# Patient Record
Sex: Female | Born: 1960
Health system: Southern US, Community
[De-identification: ages and names within clinical notes are randomized; demographics above are authoritative.]

## PROBLEM LIST (undated history)

## (undated) DIAGNOSIS — M1990 Unspecified osteoarthritis, unspecified site: Secondary | ICD-10-CM

## (undated) DIAGNOSIS — C801 Malignant (primary) neoplasm, unspecified: Secondary | ICD-10-CM

## (undated) DIAGNOSIS — F419 Anxiety disorder, unspecified: Secondary | ICD-10-CM

## (undated) DIAGNOSIS — I2510 Atherosclerotic heart disease of native coronary artery without angina pectoris: Secondary | ICD-10-CM

## (undated) DIAGNOSIS — Z87442 Personal history of urinary calculi: Secondary | ICD-10-CM

## (undated) DIAGNOSIS — E785 Hyperlipidemia, unspecified: Secondary | ICD-10-CM

## (undated) DIAGNOSIS — T7840XA Allergy, unspecified, initial encounter: Secondary | ICD-10-CM

## (undated) DIAGNOSIS — F32A Depression, unspecified: Secondary | ICD-10-CM

## (undated) DIAGNOSIS — M810 Age-related osteoporosis without current pathological fracture: Secondary | ICD-10-CM

## (undated) DIAGNOSIS — J449 Chronic obstructive pulmonary disease, unspecified: Secondary | ICD-10-CM

## (undated) DIAGNOSIS — G709 Myoneural disorder, unspecified: Secondary | ICD-10-CM

## (undated) DIAGNOSIS — F329 Major depressive disorder, single episode, unspecified: Secondary | ICD-10-CM

## (undated) DIAGNOSIS — K219 Gastro-esophageal reflux disease without esophagitis: Secondary | ICD-10-CM

## (undated) HISTORY — DX: Depression, unspecified: F32.A

## (undated) HISTORY — DX: Chronic obstructive pulmonary disease, unspecified: J44.9

## (undated) HISTORY — DX: Unspecified osteoarthritis, unspecified site: M19.90

## (undated) HISTORY — DX: Allergy, unspecified, initial encounter: T78.40XA

## (undated) HISTORY — PX: BREAST SURGERY: SHX581

## (undated) HISTORY — DX: Anxiety disorder, unspecified: F41.9

## (undated) HISTORY — DX: Gastro-esophageal reflux disease without esophagitis: K21.9

## (undated) HISTORY — DX: Malignant (primary) neoplasm, unspecified: C80.1

## (undated) HISTORY — DX: Myoneural disorder, unspecified: G70.9

## (undated) HISTORY — DX: Hyperlipidemia, unspecified: E78.5

---
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
## 1993-12-25 HISTORY — PX: ENDOMETRIAL ABLATION: SHX621

## 1998-12-09 ENCOUNTER — Other Ambulatory Visit: Admission: RE | Admit: 1998-12-09 | Discharge: 1998-12-09 | Payer: Self-pay | Admitting: *Deleted

## 1999-01-27 ENCOUNTER — Other Ambulatory Visit: Admission: RE | Admit: 1999-01-27 | Discharge: 1999-01-27 | Payer: Self-pay | Admitting: Radiology

## 1999-03-01 ENCOUNTER — Other Ambulatory Visit: Admission: RE | Admit: 1999-03-01 | Discharge: 1999-03-01 | Payer: Self-pay | Admitting: Obstetrics and Gynecology

## 1999-03-28 ENCOUNTER — Ambulatory Visit (HOSPITAL_COMMUNITY): Admission: RE | Admit: 1999-03-28 | Discharge: 1999-03-28 | Payer: Self-pay | Admitting: Obstetrics and Gynecology

## 2000-04-20 ENCOUNTER — Encounter (INDEPENDENT_AMBULATORY_CARE_PROVIDER_SITE_OTHER): Payer: Self-pay | Admitting: Specialist

## 2000-04-20 ENCOUNTER — Ambulatory Visit (HOSPITAL_COMMUNITY): Admission: RE | Admit: 2000-04-20 | Discharge: 2000-04-20 | Payer: Self-pay | Admitting: Gastroenterology

## 2000-12-31 ENCOUNTER — Other Ambulatory Visit: Admission: RE | Admit: 2000-12-31 | Discharge: 2000-12-31 | Payer: Self-pay | Admitting: Obstetrics and Gynecology

## 2001-02-15 ENCOUNTER — Encounter (INDEPENDENT_AMBULATORY_CARE_PROVIDER_SITE_OTHER): Payer: Self-pay | Admitting: Specialist

## 2001-02-15 ENCOUNTER — Ambulatory Visit (HOSPITAL_COMMUNITY): Admission: RE | Admit: 2001-02-15 | Discharge: 2001-02-15 | Payer: Self-pay | Admitting: General Surgery

## 2002-02-14 ENCOUNTER — Encounter: Admission: RE | Admit: 2002-02-14 | Discharge: 2002-02-14 | Payer: Self-pay | Admitting: General Surgery

## 2002-02-14 ENCOUNTER — Encounter: Payer: Self-pay | Admitting: General Surgery

## 2002-03-18 ENCOUNTER — Other Ambulatory Visit: Admission: RE | Admit: 2002-03-18 | Discharge: 2002-03-18 | Payer: Self-pay | Admitting: Obstetrics and Gynecology

## 2003-02-17 ENCOUNTER — Encounter: Admission: RE | Admit: 2003-02-17 | Discharge: 2003-02-17 | Payer: Self-pay | Admitting: Obstetrics and Gynecology

## 2003-02-17 ENCOUNTER — Encounter: Payer: Self-pay | Admitting: Obstetrics and Gynecology

## 2003-03-24 ENCOUNTER — Other Ambulatory Visit: Admission: RE | Admit: 2003-03-24 | Discharge: 2003-03-24 | Payer: Self-pay | Admitting: Obstetrics and Gynecology

## 2003-04-20 ENCOUNTER — Ambulatory Visit (HOSPITAL_COMMUNITY): Admission: RE | Admit: 2003-04-20 | Discharge: 2003-04-20 | Payer: Self-pay | Admitting: Gastroenterology

## 2004-11-29 ENCOUNTER — Ambulatory Visit (HOSPITAL_COMMUNITY): Admission: RE | Admit: 2004-11-29 | Discharge: 2004-11-29 | Payer: Self-pay | Admitting: Obstetrics and Gynecology

## 2004-12-25 HISTORY — PX: OTHER SURGICAL HISTORY: SHX169

## 2004-12-25 HISTORY — PX: BLADDER TUMOR EXCISION: SHX238

## 2005-06-16 ENCOUNTER — Encounter (INDEPENDENT_AMBULATORY_CARE_PROVIDER_SITE_OTHER): Payer: Self-pay | Admitting: Specialist

## 2005-06-16 ENCOUNTER — Ambulatory Visit (HOSPITAL_COMMUNITY): Admission: RE | Admit: 2005-06-16 | Discharge: 2005-06-16 | Payer: Self-pay | Admitting: Urology

## 2005-06-16 ENCOUNTER — Ambulatory Visit (HOSPITAL_BASED_OUTPATIENT_CLINIC_OR_DEPARTMENT_OTHER): Admission: RE | Admit: 2005-06-16 | Discharge: 2005-06-16 | Payer: Self-pay | Admitting: Urology

## 2006-06-20 ENCOUNTER — Ambulatory Visit (HOSPITAL_COMMUNITY): Admission: RE | Admit: 2006-06-20 | Discharge: 2006-06-20 | Payer: Self-pay | Admitting: Obstetrics and Gynecology

## 2006-08-28 ENCOUNTER — Encounter: Admission: RE | Admit: 2006-08-28 | Discharge: 2006-08-28 | Payer: Self-pay | Admitting: Emergency Medicine

## 2006-08-30 ENCOUNTER — Encounter: Admission: RE | Admit: 2006-08-30 | Discharge: 2006-08-30 | Payer: Self-pay | Admitting: Obstetrics and Gynecology

## 2006-12-05 ENCOUNTER — Ambulatory Visit (HOSPITAL_COMMUNITY): Admission: RE | Admit: 2006-12-05 | Discharge: 2006-12-05 | Payer: Self-pay | Admitting: Cardiology

## 2007-01-31 ENCOUNTER — Encounter: Admission: RE | Admit: 2007-01-31 | Discharge: 2007-01-31 | Payer: Self-pay | Admitting: Internal Medicine

## 2007-02-25 ENCOUNTER — Ambulatory Visit: Payer: Self-pay | Admitting: Internal Medicine

## 2007-02-26 ENCOUNTER — Ambulatory Visit: Payer: Self-pay | Admitting: Internal Medicine

## 2007-02-26 ENCOUNTER — Encounter (INDEPENDENT_AMBULATORY_CARE_PROVIDER_SITE_OTHER): Payer: Self-pay | Admitting: Specialist

## 2007-06-24 ENCOUNTER — Encounter: Admission: RE | Admit: 2007-06-24 | Discharge: 2007-06-24 | Payer: Self-pay | Admitting: Internal Medicine

## 2011-01-15 ENCOUNTER — Encounter: Payer: Self-pay | Admitting: Internal Medicine

## 2011-05-12 NOTE — Op Note (Signed)
NAME:  Candace Middleton, Candace Middleton          ACCOUNT NO.:  [id number]   MEDICAL RECORD NO.:  [id number]          PATIENT TYPE:  AMB   LOCATION:  [facility]                         FACILITY:  [facility]   PHYSICIAN:  Jamison Neighbor, M.D.  DATE OF BIRTH:  Jul 18, 1961   DATE OF PROCEDURE:  06/16/2005  DATE OF DISCHARGE:                                 OPERATIVE REPORT   PREOPERATIVE DIAGNOSES:  1.  Painful bladder syndrome, rule out interstitial cystitis.  2.  History of smoking.   POSTOPERATIVE DIAGNOSES:  Transitional cell carcinoma.   PROCEDURE:  Cystoscopy and transurethral resection of bladder tumor.   SURGEON:  Jamison Neighbor, M.D.   ANESTHESIA:  General.   COMPLICATIONS:  None.   DRAINS:  Foley catheter to a leg bag.   SPECIMENS:  Resected transitional cell carcinoma.   HISTORY:  This 50 year old female was referred for evaluation of chronic  pelvic pain felt to be chronic interstitial cystitis.  The patient has  symptoms that are unresponsive to antibiotic therapy or to anticholinergic  therapy.  She says that her pain feels like a headache in my pelvis, which  is a fairly common description for the IC symptom complex.  The patient is  known to have fibromyalgia, which is, of course, associated with  interstitial cystitis.  It is felt based on her initial evaluation, the  patient might have IC.  We did note at the time of her initial evaluation,  the patient had been smoking at least one pack of cigarettes a day and had  done so x36 years.  We told her this places her at risk for bladder cancer  and for that reason, suggested that she undergo diagnostic evaluation with  cystoscopy and possible hydrodistention to determine if she has IC. This  will also allow [place] to rule out any transitional cell carcinoma.  The patient  understands the risks and benefits of the procedure and gave full informed  consent.   PROCEDURE:  After successful induction of general anesthesia, the patient  was  placed in the dorsal lithotomy position and prepped with Betadine and  draped in the usual sterile fashion.  Careful bimanual examination revealed  no abnormalities of the urethra, specifically, no signs of diverticulum.  She had no cystocele, rectocele, or enterocele.  There were no masses on  bimanual exam.  The urethra was slightly tight but was easily dilated to a  32 [place].  The cystoscope was inserted.  The bladder was carefully  inspected.  The ureters were normal in configuration and location.  Clear  urine was seen to efflux bilaterally.  On the left-hand side, out towards  the lateral side wall, there was a patch of 3-4 obvious transitional cell  tumors.  The remainder of the bladder was carefully inspected with both 12  degree and 70 degree lesions.  No other tumors could be seen.  The  cystoscope was removed.  The resectoscope was inserted.  Using a Timberlake  obturator, the Clarks resectoscope was then used to resect that area.  Care was taken to insure that this did go down all the way into muscle  so  that there would be adequate specimen for the pathologist.  Additional  inspection showed no other tumors anywhere else in the bladder.  The area in  question was cauterized.  Because the tissue looked very thin in that area,  it was felt that a Foley catheter should be left in place.  Because the  tissue was quite thin and there was fear of extravasation, it was felt that  it would be inappropriate to do a hydrodistention.  The reason that the  hydrodistention was not done prior to resection was for fear that if there  was significant bleeding, it would obscure the careful resection of that  area.  The bladder was drained.  A Foley catheter was inserted.  This will  be placed to straight drainage, and the patient was sent home with a leg  bag.  The patient will be sent home with Lorcet Plus, Pyridium Plus, and  Macrodantin and will have her Foley catheter removed in about a  week.     _______________    RJE/MEDQ  D:  06/16/2005  T:  06/16/2005  Job:  [id number]   cc:   Harrel Lemon. Merla Riches, M.D.  [address]  [place]  [place] [address]  Fax: [phone]

## 2011-05-12 NOTE — Op Note (Signed)
NAME:  Candace Middleton, Candace Middleton                    ACCOUNT NO.:  [id number]   MEDICAL RECORD NO.:  [id number]                   PATIENT TYPE:  AMB   LOCATION:  ENDO                                 FACILITY:  [facility]   PHYSICIAN:  John C. Madilyn Fireman, M.D.                 DATE OF BIRTH:  Jun 05, 1961   DATE OF PROCEDURE:  04/20/2003  DATE OF DISCHARGE:                                 OPERATIVE REPORT   PROCEDURE:  Esophagogastroduodenoscopy.   INDICATIONS FOR PROCEDURE:  Worsening reflux symptoms not adequately  controlled by Protonix. Patient also undergoing colonoscopy today for  followup of adenomatous colon polyps.   DESCRIPTION OF PROCEDURE:  The patient was placed in the left lateral  decubitus position then placed on the pulse monitor with continuous low flow  oxygen delivered by nasal cannula. She was sedated with 50 mcg IV fentanyl  and 5 mg IV Versed. The Olympus video endoscope was advanced under direct  vision into the oropharynx and esophagus. The esophagus was straight and of  normal caliber with a small 1 cm hiatal hernia and the squamocolumnar line  at 37 cm. The squamocolumnar line appeared relatively intact but there was a  discreet 2 cm long erosion approximately a 1/2 cm wide with central area of  exudate and slight elevation of the surrounding margins with erythema there  consistent with a significant esophageal peptic erosions. There was no  definite stricture, no visible suggestion of neoplasm, no biopsies were  obtained. The stomach was entered and a small amount of liquid secretions  were suctioned from the fundus. Retroflexed view of the cardia was  unremarkable. The fundus, body, antrum and pylorus all appeared normal. The  duodenum was entered and both the bulb and second portion are well inspected  and appear to be within normal limits.  The scope was then withdrawn and the  patient returned to the recovery room in stable condition. She tolerated the  procedure well  and there were no immediate complications.   IMPRESSION:  Significant distal esophageal erosion.   PLAN:  Will double up on her proton pump inhibitor and followup in the  office for resolution of symptoms.                                               John C. Madilyn Fireman, M.D.    JCH/MEDQ  D:  04/20/2003  T:  04/20/2003  Job:  [id number]   cc:   Harrel Lemon. Merla Riches, M.D.  [address]  [place]  [place] [address]  Fax: [phone]

## 2011-05-12 NOTE — Op Note (Signed)
NAME:  Candace Middleton, Candace Middleton                    ACCOUNT NO.:  [id number]   MEDICAL RECORD NO.:  [id number]                   PATIENT TYPE:  AMB   LOCATION:  ENDO                                 FACILITY:  [facility]   PHYSICIAN:  John C. Madilyn Fireman, M.D.                 DATE OF BIRTH:  05/22/1961   DATE OF PROCEDURE:  04/20/2003  DATE OF DISCHARGE:                                 OPERATIVE REPORT   PROCEDURE:  Colonoscopy.   INDICATIONS FOR PROCEDURE:  History of adenomatous colon polyps on initial  colonoscopy three years ago.   DESCRIPTION OF PROCEDURE:  The patient was placed in the left lateral  decubitus position then placed on the pulse monitor with continuous low flow  oxygen delivered by nasal cannula. She was sedated with 50 mcg IV fentanyl  and 5 mg IV Versed in addition to 50 mcg of fentanyl and 5 mg of IV Versed  given for the previous EGD. The Olympus video colonoscope was inserted into  the rectum and advanced to the cecum, confirmed by transillumination at  McBurney's point and visualization of the ileocecal valve and appendiceal  orifice. The prep was good. The cecum, ascending, transverse, descending and  sigmoid colon all appeared normal with no masses, polyps, diverticula or  other mucosal abnormalities. The rectum likewise appeared normal and  retroflexed view of the anus revealed no obvious internal hemorrhoids. The  colonoscope was then withdrawn and the patient returned to the recovery room  in stable condition. The patient tolerated the procedure well and there were  no immediate complications.   IMPRESSION:  Normal study.   PLAN:  Repeat colonoscopy in five years based on her prior colon polyps.                                               John C. Madilyn Fireman, M.D.    JCH/MEDQ  D:  04/20/2003  T:  04/20/2003  Job:  [id number]   cc:   Harrel Lemon. Merla Riches, M.D.  [address]  [place]  [place] [address]  Fax: [phone]

## 2011-05-12 NOTE — Procedures (Signed)
[facility]  Patient:    Candace Middleton, Candace Middleton                 MRN: [id number] Proc. Date: 04/20/00 Adm. Date:  [id number] Disc. Date: [id number] Attending:  Louie Bun                           Procedure Report  PROCEDURE:  Colonoscopy with polypectomy.  INDICATION FOR PROCEDURE:  Intermittent rectal bleeding in a 50 year old patient with no obvious perianal source.  DESCRIPTION OF PROCEDURE:  The patient was placed in the left lateral decubitus position and placed on the pulse monitor with continuous low-flow oxygen delivered via nasal cannula.  She was sedated with 95 mg IV Demerol and 9 mg IV Versed.  The Olympus video colonoscope was inserted into the rectum and advanced to the cecum, confirmed by transillumination at McBurneys point and visualization of the ileocecal valve and appendiceal orifice.  The prep was good.  The cecum, ascending, transverse, and descending colon all appeared normal with no masses, polyps, diverticula, or other mucosal abnormalities. Within the sigmoid colon was seen an 8 mm sessile polyp which was now removed by hot biopsy.  The remainder of the sigmoid appeared normal down to the rectum, and retroflexed view of the anus revealed no obvious internal hemorrhoids.  The colonoscope was then withdrawn and the patient returned to the recovery room in stable condition.  She tolerated the procedure well, and there were no immediate complications.  IMPRESSION:  Sigmoid colon polyp, fulgurated, otherwise normal colonoscopy.  PLAN:  Await histology for determination of method and interval for future colon screening.  Will observe for any further rectal bleeding. DD:  05/23/00 TD:  05/28/00 Job: [id number]

## 2011-05-12 NOTE — Op Note (Signed)
[facility]  Patient:    Candace Middleton, Candace Middleton                MRN: [id number] Proc. Date: 02/15/01 Attending:  Gita Kudo, M.D. CC:         Cordelia Pen A. Rosalio Macadamia, M.D.  Robert P. Merla Riches, M.D.   Operative Report  OPERATION PERFORMED:  Excisional biopsy left breast mass.  SURGEON:  Dr. Maryagnes Amos.  ANESTHESIA:  MAC-IV sedation, local 1% xylocaine.  PREOPERATIVE DIAGNOSES:  Mass left breast.  POSTOPERATIVE DIAGNOSES:  Mass left breast, pending pathology.  CLINICAL SUMMARY:  A 50 year old female with a lump in her left breast. It has been present several years. It was biopsied in 2000 and shown to be a fibroadenoma. She has had multiple repeat studies in 2001 and another ultrasound this year. She is ready to have it excised so she does not have to have continuous follow-up and evaluation.  FINDINGS:  There is a firm rubbery mass about 2 cm in size localized at 3 oclock in the left breast.  DESCRIPTION OF PROCEDURE:  Under satisfactory intravenous sedation, the patients left breast was prepped and draped in a standard fashion. A curved incision was made centered over the mass and the breast tissue dissected down to the mass. The mass was removed with a rim of normal appearing breast tissue. I did not inspect the mass any further. I sent it to the laboratory for permanent diagnosis. The wound was then made dry by cautery, lavaged with saline and closed with interrupted Vicryl for the breast and interrupted and running nylon for skin. Sterile absorbent dressing was then applied and the patient went to the recovery room from the operating room in good condition. DD:  02/15/01 TD:  02/18/01 Job: [id number]

## 2011-05-12 NOTE — Procedures (Signed)
[place]  Patient:    Candace Middleton, Candace Middleton                 MRN: [id number] Proc. Date: 04/20/00 Adm. Date:  [id number] Attending:  Louie Bun CC:         Dr. Guerry Bruin                           Procedure Report  PROCEDURE:  Colonoscopy with polypectomy.  INDICATIONS:  Intermittent rectal bleeding, bloating and constipation.  PROCEDURE:  The patient was placed in the left lateral decubitus position and placed on the pulse monitor with continuous low-flow oxygen delivered by nasal cannula.  She was sedated with 95 mg IV Demerol and 9 mg IV Versed.  The Olympus video colonoscope was inserted into the rectum and advanced to the cecum, confirmed by transillumination of McBurneys point and visualization of the ileocecal valve and appendiceal orifice.  The prep was excellent.  The cecum, ascending, transverse and descending colon all appeared normal with no masses, polyps, diverticuli or other mucosal abnormalities.  Within the sigmoid colon was seen a 1 cm polyp, which was removed by snare.  The remainder of the sigmoid and rectum appeared normal down to the anus, where retroflexed view revealed some small internal hemorrhoids.  The colonoscope was then withdrawn and the patient returned to the recovery room in stable condition.  She tolerated the procedure well and there were no immediate complications.  IMPRESSION: 1. Sigmoid colon polyp. 2. Internal hemorrhoids.  PLAN:  Await histology for determination of method and interval for future colon screening.  Will treatment constipation and hemorrhoid symptomatically. DD:  04/20/00 TD:  04/21/00 Job: [id number]

## 2011-05-12 NOTE — Assessment & Plan Note (Signed)
[facility]                         GASTROENTEROLOGY OFFICE NOTE   NYHLA, SALTER                 MRN:          [id number]  DATE:02/25/2007                            DOB:          17-Oct-1961    REASON FOR CONSULTATION:  Heartburn, indigestion, dysphagia and  constipation.   HISTORY:  This is a 50 year old white female with a history of  gastroesophageal reflux disease with associated erosive esophagitis,  chronic constipation, adenomatous colon polyps, interstitial cystitis,  transitional cell bladder cancer, and hyperlipidemia. She is referred  now through the courtesy of Dr. Merla Riches regarding the above-listed GI  complaints. She has been evaluated by gastroenterologist (Dr. Dorena Cookey) for these problems. Upper endoscopy in April of 2004 revealed  erosive esophagitis despite once-daily protein-pump inhibitor therapy.  Her initial colonoscopy was performed in April of 2001. She was found to  have sigmoid colon polyp. This was removed and found to be adenomatous.  Surveillance colonoscopy was performed in April of 2004. This was  normal. Follow up in 5 years recommended. She reports to me that over  the past 6 months she has had problems with globus-type sensation. As  well, intermittent solid-food and pill dysphagia. She also mentioned  that she had been off of her medications for several months with  significant indigestion and heartburn. Though she has been on protein-  pump inhibitors intermittently for years, she does require b.i.d.  therapy for good control of symptoms. She reports to me that she has  back on Protonix 40 mg b.i.d. for the past 6 weeks. Despite this,  problems with globus sensation and intermittent solid-food dysphagia  persist. She has had no weight loss. As a matter of fact, she has had 30-  pound weight gain since she discontinued smoking in 2006 after being  diagnosed with bladder cancer. She continues with  chronic constipation  unchanged from years past. She takes MiraLax b.i.d. as well as Dulcolax  tablets every 4 to 5 days. Occasional has some minor rectal bleeding  previously attributed to hemorrhoids. No other issues. She does inquire  as to other possible therapies requiring her reflux as she finds taking  her medicine twice daily inconvenient. There are no problems with  affordability or the insurance coverage for her drugs.   PAST MEDICAL HISTORY:  1. Gastroesophageal reflux disease with a history of erosive      esophagitis as described above.  2. History of adenomatous colon polyps.  3. Chronic functional constipation.  4. History of interstitial cystitis.  5. Hyperlipidemia.  6. History of transitional cell bladder cancer status post local      excision followed by chemotherapy in 2006.  7. Status post cesarean section.   ALLERGIES:  SULFA.   CURRENT MEDICATIONS:  1. Protonix 40 mg b.i.d.  2. Sanctura XR 60 mg daily.  3. Elmiron 200 mg b.i.d.  4. Prometrium 100 mg at night.  5. Pravachol 40 mg daily.  6. Estradiol 0.5 mg daily.  7. Elavil 75 mg at night.  8. Aspirin 81 mg daily.  9. MiraLax 1 scoop b.i.d.  10.Dulcolax tablets p.r.n.   FAMILY HISTORY:  No family history of gastrointestinal malignancy.  Brother with diabetes. Mother with heart disease.   SOCIAL HISTORY:  The patient is married with 2 children. She lives with  her husband and son. She studied until the 12th grade. She works in  Clinical biochemist for [employer]. She smoked 2 packs  of cigarettes per day for many years but quit in July of 2006. She  describes herself as having had a history of alcoholism but does not use  alcohol currently.   REVIEW OF SYSTEMS:  Per diagnostic evaluation form.   PHYSICAL EXAMINATION:  Well-appearing female in no acute distress. Blood  pressure is 100/60. Heart rate is 60 and regular. Weight is 134.4  pounds. She is 5 feet in height.  HEENT:   Sclerae are anicteric. Conjunctivae are pink. Oral mucosa is  intact.  Her lungs are clear.  Her heart is regular.  The abdomen is soft without tenderness, mass or hernia. The umbilicus is  pierced.  The rectal exam was omitted.  The extremities were without edema.   IMPRESSION:  1. Chronic gastroesophageal reflux disease with previously diagnosed      erosive changes on endoscopy. Currently with globus-type sensation      likely due to reflux. As well as, intermittent solid-food dysphagia      likely due to the development of a peptic stricture.  2. Chronic constipation. Stable and unchanged.  3. History of adenomatous colon polyps on initial colonoscopy in 2001.      Negative colonoscopy in 2004. Follow up in 2009 planned.   RECOMMENDATIONS:  1. Continue Protonix 40 mg b.i.d.  2. Reflux precautions with attention to weight loss.  3. Schedule upper endoscopy with probable esophageal dilatation. The      nature of the procedure as well as the risks, benefits and      alternatives have been reviewed. She understood and agreed to      proceed. In addition, she was provided literature on upper      endoscopy, esophageal dilation, reflux disease, and      stricture/esophagitis.  4. Continue with MiraLax and p.r.n. for constipation.  5. Ongoing general medical care with Dr. Merla Riches.     Wilhemina Bonito. Marina Goodell, MD  Electronically Signed    JNP/MedQ  DD: 02/25/2007  DT: 02/25/2007  Job #: [id number]   cc:   Harrel Lemon. Merla Riches, M.D.  Jamison Neighbor, M.D.

## 2012-12-25 DIAGNOSIS — G709 Myoneural disorder, unspecified: Secondary | ICD-10-CM

## 2012-12-25 HISTORY — DX: Myoneural disorder, unspecified: G70.9

## 2013-05-09 ENCOUNTER — Encounter: Payer: Self-pay | Admitting: *Deleted

## 2013-05-09 NOTE — Progress Notes (Signed)
[facility] Healthcare Advance Directives  Clinical Social Work  Clinical Social Work was referred by patient to review and complete healthcare advance directives. Clinical Social Worker met with patient and spouse in infusion room. The patient designated their spouse as their primary healthcare agent. Patient also completed healthcare living will.   Clinical Social Worker notarized documents and made copies for patient/family. Clinical Social Worker will send documents to medical records to be scanned into patient's chart.  Clinical Social Worker encouraged patient/family to contact with any additional questions or concerns.   Kathrin Penner, MSW, LCSW  Clinical Social Worker  [facility]  [phone]

## 2013-06-24 ENCOUNTER — Ambulatory Visit: Payer: BC Managed Care – PPO

## 2013-06-24 ENCOUNTER — Ambulatory Visit (INDEPENDENT_AMBULATORY_CARE_PROVIDER_SITE_OTHER): Payer: BC Managed Care – PPO | Admitting: Internal Medicine

## 2013-06-24 VITALS — BP 119/74 | HR 69 | Temp 98.0°F | Resp 18 | Ht 60.5 in | Wt 101.2 lb

## 2013-06-24 DIAGNOSIS — F17200 Nicotine dependence, unspecified, uncomplicated: Secondary | ICD-10-CM

## 2013-06-24 DIAGNOSIS — Z1211 Encounter for screening for malignant neoplasm of colon: Secondary | ICD-10-CM

## 2013-06-24 DIAGNOSIS — G9050 Complex regional pain syndrome I, unspecified: Secondary | ICD-10-CM

## 2013-06-24 DIAGNOSIS — Z0000 Encounter for general adult medical examination without abnormal findings: Secondary | ICD-10-CM

## 2013-06-24 DIAGNOSIS — R05 Cough: Secondary | ICD-10-CM

## 2013-06-24 DIAGNOSIS — G4700 Insomnia, unspecified: Secondary | ICD-10-CM

## 2013-06-24 DIAGNOSIS — R5381 Other malaise: Secondary | ICD-10-CM

## 2013-06-24 DIAGNOSIS — R059 Cough, unspecified: Secondary | ICD-10-CM

## 2013-06-24 DIAGNOSIS — K5900 Constipation, unspecified: Secondary | ICD-10-CM

## 2013-06-24 DIAGNOSIS — Z87891 Personal history of nicotine dependence: Secondary | ICD-10-CM | POA: Insufficient documentation

## 2013-06-24 DIAGNOSIS — G90522 Complex regional pain syndrome I of left lower limb: Secondary | ICD-10-CM

## 2013-06-24 LAB — POCT URINALYSIS DIPSTICK
Bilirubin, UA: NEGATIVE
Blood, UA: NEGATIVE
Glucose, UA: NEGATIVE
Ketones, UA: NEGATIVE
Leukocytes, UA: NEGATIVE
Nitrite, UA: NEGATIVE
Protein, UA: NEGATIVE
Spec Grav, UA: 1.015
Urobilinogen, UA: 0.2
pH, UA: 7

## 2013-06-24 LAB — COMPREHENSIVE METABOLIC PANEL
ALT: 11 U/L (ref 0–35)
AST: 15 U/L (ref 0–37)
Albumin: 4.7 g/dL (ref 3.5–5.2)
Alkaline Phosphatase: 86 U/L (ref 39–117)
BUN: 10 mg/dL (ref 6–23)
CO2: 25 mEq/L (ref 19–32)
Calcium: 9.9 mg/dL (ref 8.4–10.5)
Chloride: 104 mEq/L (ref 96–112)
Creat: 0.69 mg/dL (ref 0.50–1.10)
Glucose, Bld: 100 mg/dL — ABNORMAL HIGH (ref 70–99)
Potassium: 4.3 mEq/L (ref 3.5–5.3)
Sodium: 139 mEq/L (ref 135–145)
Total Bilirubin: 0.6 mg/dL (ref 0.3–1.2)
Total Protein: 6.6 g/dL (ref 6.0–8.3)

## 2013-06-24 LAB — LIPID PANEL
Cholesterol: 237 mg/dL — ABNORMAL HIGH (ref 0–200)
HDL: 65 mg/dL (ref >39)
LDL Cholesterol: 157 mg/dL — ABNORMAL HIGH (ref 0–99)
Total CHOL/HDL Ratio: 3.6 Ratio
Triglycerides: 74 mg/dL (ref <150)
VLDL: 15 mg/dL (ref 0–40)

## 2013-06-24 LAB — POCT CBC
Granulocyte percent: 58.3 %G (ref 37–80)
HCT, POC: 48 % — AB (ref 37.7–47.9)
Hemoglobin: 15.4 g/dL (ref 12.2–16.2)
Lymph, poc: 2.5 (ref 0.6–3.4)
MCH, POC: 31 pg (ref 27–31.2)
MCHC: 32.1 g/dL (ref 31.8–35.4)
MCV: 96.5 fL (ref 80–97)
MID (cbc): 0.5 (ref 0–0.9)
MPV: 9 fL (ref 0–99.8)
POC Granulocyte: 4.2 (ref 2–6.9)
POC LYMPH PERCENT: 34.2 %L (ref 10–50)
POC MID %: 7.5 %M (ref 0–12)
Platelet Count, POC: 279 10*3/uL (ref 142–424)
RBC: 4.97 M/uL (ref 4.04–5.48)
RDW, POC: 14.4 %
WBC: 7.2 10*3/uL (ref 4.6–10.2)

## 2013-06-24 LAB — TSH: TSH: 2.306 u[IU]/mL (ref 0.350–4.500)

## 2013-06-24 LAB — IFOBT (OCCULT BLOOD): IFOBT: NEGATIVE

## 2013-06-24 MED ORDER — CLONAZEPAM 0.5 MG PO TABS: 0.5000 mg | ORAL_TABLET | Freq: Two times a day (BID) | ORAL | Status: DC | PRN

## 2013-06-24 NOTE — Progress Notes (Signed)
Subjective:    Patient ID: Candace Middleton, female    DOB: 1961/05/30, 52 y.o.   MRN: [id number]  HPI  Presents for annual physical. States she has not seen a PCP in approximately 10 years. She would like to schedule a mammogram and colonoscopy. Pt complains of constipation, which she has had intermittently for life, but worsening the past 6 months. She tried dulax but experienced abdominal pain, sweating, lightheadedness, dizziness. Left breast has occasional burning sensation and frequently has inverted nipple, which she is able to "flip back out".  She reports feeling anxious and depressed daily, "snaps" more easily, doesn't feel like herself but contributes this to her current life stressors. Additionally she reports constant pelvic pain and urinary symptoms which are not new for her and have been present since bladder cancer diagnosis. She is maintaining urological followup.  PMH: bladder cancer 2006 with normal recent cystoscopy; Jan 2014 fractured knee cap developed RSD and receives weekly nerve block injections; lumpectomy ("long time ago"), colonoscopy ("long time ago") which removed multiple polyps.  Husband just finished last round of chemo and radiation for lung cancer. Son with multiple handicaps developed optic neuritis earlier this year. Currently she is unable to work due to RSD. Current smoker.   Current outpatient prescriptions:mirabegron ER (MYRBETRIQ) 50 MG TB24, Take by mouth daily., Disp: , Rfl: ;  pregabalin (LYRICA) 100 MG capsule, Take 100 mg by mouth 2 (two) times daily., Disp: , Rfl: ;  tiZANidine (ZANAFLEX) 4 MG tablet, Take 4 mg by mouth 3 (three) times daily  Tetanus about 2 years ago Not ready consider Zostavax her Pneumovax  Review of Systems Denies: headache, dizziness, SOB, chest pain, abdominal pain//  no fever chills night sweats or weight loss Has marked fatigue Daily nonproductive cough/long-term smoker She feels her anxiety is appropriate given the  amount of stress she is under    Objective:   Physical Exam  BP 119/74  Pulse 69  Temp(Src) 98 F (36.7 C) (Oral)  Resp 18  Ht 5' 0.5" (1.537 m)  Wt 101 lb 3.2 oz (45.904 kg)  BMI 19.43 kg/m2  SpO2 100% No acute distress HEENT clear without thyromegaly or lymphadenopathy Heart regular without murmur click Lungs clear Abdomen soft nontender without organomegaly or masses Introitus clear/os clear/uterus midposition and small/no adnexal masses or tenderness Rectal exam without masses and heme-negative stool present Neck full range of motion Spine straight/straight leg raise negative Extremities full range of motion/good peripheral pulses without edema Neurological intact Mood stable     [facility] reading (PRIMARY) by  Dr. Merla Riches NAD except ?blunted calyces on R   Assessment & Plan:  CPE- Cough - Plan: DG Chest 2 View  Nicotine addiction - Plan: DG Chest 2 View  Routine general medical examination at a health care facility - Plan: POCT CBC, Comprehensive metabolic panel, Lipid panel, TSH, Pap IG and HPV (high risk) DNA detection, IFOBT POC (occult bld, rslt in office), POCT urinalysis dipstick, Unspecified constipation - Plan: IFOBT POC (occult bld, rslt in office)//colonoscopy( This would be her third colonoscopy as she had polyps on #1 with a negative followup in 3 years, but she has not had a colonoscopy in the last 10 years at least--she doesn't remember her last colonoscopy was)  Other malaise and fatigue - Plan: POCT CBC, Comprehensive metabolic panel, Lipid panel, TSH, CANCELED: Pap IG and HPV (high risk) DNA detection  Insomnia--- Klonopin at bedtime  RSD (reflex sympathetic dystrophy)=Complex regional pain syndrome of left lower extremity

## 2013-06-25 LAB — PAP IG AND HPV HIGH-RISK: HPV DNA High Risk: NOT DETECTED

## 2013-06-26 ENCOUNTER — Encounter: Payer: Self-pay | Admitting: Internal Medicine

## 2013-07-01 ENCOUNTER — Encounter: Payer: Self-pay | Admitting: Internal Medicine

## 2013-07-03 ENCOUNTER — Telehealth: Payer: Self-pay

## 2013-07-03 DIAGNOSIS — N644 Mastodynia: Secondary | ICD-10-CM

## 2013-07-03 NOTE — Telephone Encounter (Signed)
Pt is calling to see if dr Merla Riches has ordered her referral to the breast center   Best number [phone]

## 2013-07-04 NOTE — Telephone Encounter (Signed)
Yes, I have put in the order, she will get a call, will be diagnostic since she has burning of her left breast.

## 2013-07-06 ENCOUNTER — Telehealth: Payer: Self-pay

## 2013-07-06 NOTE — Telephone Encounter (Signed)
Pt is wanting to talk with someone about pap results from end of June  Best number [phone]

## 2013-07-07 NOTE — Telephone Encounter (Signed)
Pt never received our letter-calling about her labs-notified.

## 2013-07-15 ENCOUNTER — Ambulatory Visit
Admission: RE | Admit: 2013-07-15 | Discharge: 2013-07-15 | Disposition: A | Discharge status: Home / Self Care | Payer: BC Managed Care – PPO | Source: Ambulatory Visit | Attending: Internal Medicine | Admitting: Internal Medicine

## 2013-07-15 ENCOUNTER — Other Ambulatory Visit: Payer: Self-pay | Admitting: Internal Medicine

## 2013-07-15 DIAGNOSIS — N644 Mastodynia: Secondary | ICD-10-CM

## 2013-08-21 ENCOUNTER — Ambulatory Visit (AMBULATORY_SURGERY_CENTER): Payer: BC Managed Care – PPO

## 2013-08-21 ENCOUNTER — Encounter: Payer: Self-pay | Admitting: Internal Medicine

## 2013-08-21 VITALS — Ht 60.0 in | Wt 98.8 lb

## 2013-08-21 DIAGNOSIS — Z860100 Personal history of colon polyps, unspecified: Secondary | ICD-10-CM

## 2013-08-21 MED ORDER — MOVIPREP 100 G PO SOLR: 1.0000 | Freq: Once | ORAL | Status: DC

## 2013-08-31 ENCOUNTER — Ambulatory Visit (INDEPENDENT_AMBULATORY_CARE_PROVIDER_SITE_OTHER): Payer: BC Managed Care – PPO | Admitting: Family Medicine

## 2013-08-31 ENCOUNTER — Telehealth: Payer: Self-pay

## 2013-08-31 ENCOUNTER — Ambulatory Visit: Payer: BC Managed Care – PPO

## 2013-08-31 VITALS — BP 110/66 | HR 82 | Temp 98.2°F | Resp 18 | Ht 60.25 in | Wt 97.4 lb

## 2013-08-31 DIAGNOSIS — R05 Cough: Secondary | ICD-10-CM

## 2013-08-31 DIAGNOSIS — F17200 Nicotine dependence, unspecified, uncomplicated: Secondary | ICD-10-CM

## 2013-08-31 DIAGNOSIS — R059 Cough, unspecified: Secondary | ICD-10-CM

## 2013-08-31 DIAGNOSIS — J209 Acute bronchitis, unspecified: Secondary | ICD-10-CM

## 2013-08-31 LAB — POCT CBC
Granulocyte percent: 55 %G (ref 37–80)
HCT, POC: 47.9 % (ref 37.7–47.9)
Hemoglobin: 15.5 g/dL (ref 12.2–16.2)
Lymph, poc: 2.8 (ref 0.6–3.4)
MCH, POC: 31.1 pg (ref 27–31.2)
MCHC: 32.4 g/dL (ref 31.8–35.4)
MCV: 95.9 fL (ref 80–97)
MID (cbc): 0.6 (ref 0–0.9)
MPV: 8.5 fL (ref 0–99.8)
POC Granulocyte: 4.2 (ref 2–6.9)
POC LYMPH PERCENT: 36.8 %L (ref 10–50)
POC MID %: 8.2 %M (ref 0–12)
Platelet Count, POC: 274 10*3/uL (ref 142–424)
RBC: 4.99 M/uL (ref 4.04–5.48)
RDW, POC: 13.3 %
WBC: 7.6 10*3/uL (ref 4.6–10.2)

## 2013-08-31 MED ORDER — ALBUTEROL SULFATE HFA 108 (90 BASE) MCG/ACT IN AERS: 2.0000 | INHALATION_SPRAY | RESPIRATORY_TRACT | Status: DC | PRN

## 2013-08-31 MED ORDER — ALBUTEROL SULFATE (2.5 MG/3ML) 0.083% IN NEBU
2.5000 mg | INHALATION_SOLUTION | Freq: Once | RESPIRATORY_TRACT | Status: AC
  Administered 2013-08-31: 2.5 mg via RESPIRATORY_TRACT

## 2013-08-31 MED ORDER — AZITHROMYCIN 250 MG PO TABS: ORAL_TABLET | ORAL | Status: DC

## 2013-08-31 MED ORDER — HYDROCODONE-HOMATROPINE 5-1.5 MG/5ML PO SYRP: 5.0000 mL | ORAL_SOLUTION | Freq: Three times a day (TID) | ORAL | Status: DC | PRN

## 2013-08-31 MED ORDER — BENZONATATE 200 MG PO CAPS: 200.0000 mg | ORAL_CAPSULE | Freq: Three times a day (TID) | ORAL | Status: DC | PRN

## 2013-08-31 MED ORDER — IPRATROPIUM BROMIDE 0.02 % IN SOLN
0.5000 mg | Freq: Once | RESPIRATORY_TRACT | Status: AC
  Administered 2013-08-31: 0.5 mg via RESPIRATORY_TRACT

## 2013-08-31 NOTE — Patient Instructions (Signed)
Your lung function is greatly reduced. I am concerned your could have some mild underlying asthma or COPD/emphysema. When you are feeling better, come back for further evaluation and full lung testing to ensure your breathing, which is greatly reduced, as returned to normal.  If you don't improve with the current regimen, you may need a different antibiotic or a course of oral steroid so come back, however, I hope you will be able to avoid this with the medications prescribed today.  Bronchitis Bronchitis is the body's way of reacting to injury and/or infection (inflammation) of the bronchi. Bronchi are the air tubes that extend from the windpipe into the lungs. If the inflammation becomes severe, it may cause shortness of breath. CAUSES  Inflammation may be caused by:  A virus.  Germs (bacteria).  Dust.  Allergens.  Pollutants and many other irritants. The cells lining the bronchial tree are covered with tiny hairs (cilia). These constantly beat upward, away from the lungs, toward the mouth. This keeps the lungs free of pollutants. When these cells become too irritated and are unable to do their job, mucus begins to develop. This causes the characteristic cough of bronchitis. The cough clears the lungs when the cilia are unable to do their job. Without either of these protective mechanisms, the mucus would settle in the lungs. Then you would develop pneumonia. Smoking is a common cause of bronchitis and can contribute to pneumonia. Stopping this habit is the single most important thing you can do to help yourself. TREATMENT   Your caregiver may prescribe an antibiotic if the cough is caused by bacteria. Also, medicines that open up your airways make it easier to breathe. Your caregiver may also recommend or prescribe an expectorant. It will loosen the mucus to be coughed up. Only take over-the-counter or prescription medicines for pain, discomfort, or fever as directed by your  caregiver.  Removing whatever causes the problem (smoking, for example) is critical to preventing the problem from getting worse.  Cough suppressants may be prescribed for relief of cough symptoms.  Inhaled medicines may be prescribed to help with symptoms now and to help prevent problems from returning.  For those with recurrent (chronic) bronchitis, there may be a need for steroid medicines. SEEK IMMEDIATE MEDICAL CARE IF:   During treatment, you develop more pus-like mucus (purulent sputum).  You have a fever.  Your baby is older than 3 months with a rectal temperature of 102 F (38.9 C) or higher.  Your baby is 67 months old or younger with a rectal temperature of 100.4 F (38 C) or higher.  You become progressively more ill.  You have increased difficulty breathing, wheezing, or shortness of breath. It is necessary to seek immediate medical care if you are elderly or sick from any other disease. MAKE SURE YOU:   Understand these instructions.  Will watch your condition.  Will get help right away if you are not doing well or get worse. Document Released: 12/11/2005 Document Revised: 03/04/2012 Document Reviewed: 10/20/2008 [facility] Patient Information 2014 [place], [place].

## 2013-08-31 NOTE — Telephone Encounter (Signed)
SHAW - Pt was seen this morning and diagnosed with pneumonia.  She has a colonoscopy scheduled for this Thursday.  Is it safe for her to be placed under anesthesia?  Please call [phone]

## 2013-08-31 NOTE — Progress Notes (Signed)
Subjective:    Patient ID: Candace Middleton, female    DOB: 1961-11-25, 52 y.o.   MRN: [id number] Chief Complaint  Patient presents with  . URI    Cough-dry; chest congestion; wheezing;SOB x 5-6wks  Hx of Bronchitis and Pnuemonia   HPI Sxs began 5-6 weeks prev with sore throat and cough.  Sore throat resolved after sevd but still with cough and chest tightness.  Husband is going through lung cancer trx and when he presented he was initially misdiagnosed as having bronchitis for a while so Ms. Frankum is a little anxious about the prolonged cough.  Has chills and sweats but no documented fever.  Everything feels like it is getting stuck in the middle of her chest. Pressure all through front and back of chest - worse with exertion and cough - can really feel the pressure when takes a deep breath. + SHoB.  Took otc cough/cold meds for a while w/o relief.  Used an inhaler many years ago but doesn't have one at home still.  Does smoke occasionally and wants to quit.  Past Medical History  Diagnosis Date  . Cancer 2006    bladder  . Neuromuscular disorder 2014    RSD - nerve blocks in back for treatment    Current Outpatient Prescriptions on File Prior to Visit  Medication Sig Dispense Refill  . clonazePAM (KLONOPIN) 0.5 MG tablet Take 1 tablet (0.5 mg total) by mouth 2 (two) times daily as needed for anxiety.  30 tablet  5  . mirabegron ER (MYRBETRIQ) 50 MG TB24 Take by mouth daily.      Marland Kitchen MOVIPREP 100 G SOLR Take 1 kit (200 g total) by mouth once.  1 kit  0  . pregabalin (LYRICA) 100 MG capsule Take 100 mg by mouth 2 (two) times daily.      Marland Kitchen tiZANidine (ZANAFLEX) 4 MG tablet Take 4 mg by mouth 3 (three) times daily.       No current facility-administered medications on file prior to visit.   Allergies  Allergen Reactions  . Sulfa Antibiotics Anaphylaxis     Review of Systems  Constitutional: Positive for chills, diaphoresis and fatigue. Negative for fever and activity change.   HENT: Negative for congestion, sore throat and rhinorrhea.   Respiratory: Positive for cough, chest tightness and shortness of breath. Negative for wheezing.   Cardiovascular: Positive for chest pain. Negative for palpitations and leg swelling.  Musculoskeletal: Positive for back pain. Negative for arthralgias.  Hematological: Negative for adenopathy.  Psychiatric/Behavioral: Positive for sleep disturbance.      BP 110/66  Pulse 82  Temp(Src) 98.2 F (36.8 C) (Oral)  Resp 18  Ht 5' 0.25" (1.53 m)  Wt 97 lb 6.4 oz (44.18 kg)  BMI 18.87 kg/m2  SpO2 99% Objective:   Physical Exam  Constitutional: She is oriented to person, place, and time. She appears well-developed and well-nourished. No distress.  HENT:  Head: Normocephalic and atraumatic.  Right Ear: Tympanic membrane, external ear and ear canal normal.  Left Ear: Tympanic membrane, external ear and ear canal normal.  Nose: Nose normal. No mucosal edema or rhinorrhea.  Mouth/Throat: Uvula is midline, oropharynx is clear and moist and mucous membranes are normal. No oropharyngeal exudate.  Eyes: Conjunctivae are normal. Right eye exhibits no discharge. Left eye exhibits no discharge. No scleral icterus.  Neck: Normal range of motion. Neck supple.  Cardiovascular: Normal rate, regular rhythm, normal heart sounds and intact distal pulses.   Pulmonary/Chest: Effort  normal. She has decreased breath sounds.  Lymphadenopathy:    She has no cervical adenopathy.  Neurological: She is alert and oriented to person, place, and time.  Skin: Skin is warm and dry. She is not diaphoretic. No erythema.  Psychiatric: She has a normal mood and affect. Her behavior is normal.   Peak flow 250; goal: 445   [facility] reading (PRIMARY) by  Dr. Clelia Croft. CXR: flattened diaphragms. No lung field infiltrate seen.  Results for orders placed in visit on 08/31/13  POCT CBC      Result Value Range   WBC 7.6  4.6 - 10.2 K/uL   Lymph, poc 2.8  0.6 - 3.4   POC  LYMPH PERCENT 36.8  10 - 50 %L   MID (cbc) 0.6  0 - 0.9   POC MID % 8.2  0 - 12 %M   POC Granulocyte 4.2  2 - 6.9   Granulocyte percent 55.0  37 - 80 %G   RBC 4.99  4.04 - 5.48 M/uL   Hemoglobin 15.5  12.2 - 16.2 g/dL   HCT, POC 19.1  47.8 - 47.9 %   MCV 95.9  80 - 97 fL   MCH, POC 31.1  27 - 31.2 pg   MCHC 32.4  31.8 - 35.4 g/dL   RDW, POC 29.5     Platelet Count, POC 274  142 - 424 K/uL   MPV 8.5  0 - 99.8 fL    Assessment & Plan:  Cough - Plan: POCT CBC, DG Chest 2 View, albuterol (PROVENTIL) (2.5 MG/3ML) 0.083% nebulizer solution 2.5 mg, ipratropium (ATROVENT) nebulizer solution 0.5 mg  Tobacco use disorder - encouraged complete cessation - due to early emphysematous change and decreased peak flow - encouraged pt to RTC for full spirometry when she is feeling better to ensure she has not yet dev COPD.  Acute bronchitis - pt did improve after the duoneb in office - the chest tightness felt relieved and she had improved air movement on exam though with occ mild exp wheeze.  Start albuterol inhaler in addition to cough meds and zpack.  Meds ordered this encounter  Medications  . albuterol (PROVENTIL) (2.5 MG/3ML) 0.083% nebulizer solution 2.5 mg    Sig:   . ipratropium (ATROVENT) nebulizer solution 0.5 mg    Sig:   . albuterol (PROVENTIL HFA;VENTOLIN HFA) 108 (90 BASE) MCG/ACT inhaler    Sig: Inhale 2 puffs into the lungs every 4 (four) hours as needed for wheezing (cough, shortness of breath or wheezing.).    Dispense:  1 Inhaler    Refill:  1  . azithromycin (ZITHROMAX) 250 MG tablet    Sig: Take 2 tabs PO x 1 dose, then 1 tab PO QD x 4 days    Dispense:  6 tablet    Refill:  0  . benzonatate (TESSALON) 200 MG capsule    Sig: Take 1 capsule (200 mg total) by mouth 3 (three) times daily as needed for cough.    Dispense:  30 capsule    Refill:  0  . HYDROcodone-homatropine (HYCODAN) 5-1.5 MG/5ML syrup    Sig: Take 5 mLs by mouth every 8 (eight) hours as needed for cough.     Dispense:  120 mL    Refill:  0

## 2013-09-01 NOTE — Telephone Encounter (Signed)
I saw the pt this weekend and diagnosed her with bronchitis, not pneumonia. Yes, fine to proceed with colonoscopy with bronchitis.

## 2013-09-01 NOTE — Telephone Encounter (Signed)
Left message for her to call me back. 

## 2013-09-02 NOTE — Telephone Encounter (Signed)
Called pt, LMOM ok to get colonoscopy.

## 2013-09-03 ENCOUNTER — Ambulatory Visit: Payer: Self-pay | Admitting: Pain Medicine

## 2013-09-04 ENCOUNTER — Encounter: Payer: Self-pay | Admitting: Internal Medicine

## 2013-09-04 ENCOUNTER — Ambulatory Visit (AMBULATORY_SURGERY_CENTER): Payer: BC Managed Care – PPO | Admitting: Internal Medicine

## 2013-09-04 VITALS — BP 113/83 | HR 48 | Temp 98.3°F | Resp 13 | Ht 60.0 in | Wt 98.0 lb

## 2013-09-04 DIAGNOSIS — Z86010 Personal history of colonic polyps: Secondary | ICD-10-CM

## 2013-09-04 MED ORDER — SODIUM CHLORIDE 0.9 % IV SOLN: 500.0000 mL | INTRAVENOUS | Status: DC

## 2013-09-04 NOTE — Progress Notes (Signed)
Lidocaine-40mg IV prior to Propofol InductionPropofol given over incremental dosages 

## 2013-09-04 NOTE — Progress Notes (Signed)
Patient did not experience any of the following events: a burn prior to discharge; a fall within the facility; wrong site/side/patient/procedure/implant event; or a hospital transfer or hospital admission upon discharge from the facility. (G8907)Patient did not have preoperative order for IV antibiotic SSI prophylaxis. ) 

## 2013-09-04 NOTE — Patient Instructions (Signed)
YOU HAD AN ENDOSCOPIC PROCEDURE TODAY AT THE [facility]: Refer to the procedure report that was given to you for any specific questions about what was found during the examination.  If the procedure report does not answer your questions, please call your gastroenterologist to clarify.  If you requested that your care partner not be given the details of your procedure findings, then the procedure report has been included in a sealed envelope for you to review at your convenience later.  YOU SHOULD EXPECT: Some feelings of bloating in the abdomen. Passage of more gas than usual.  Walking can help get rid of the air that was put into your GI tract during the procedure and reduce the bloating. If you had a lower endoscopy (such as a colonoscopy or flexible sigmoidoscopy) you may notice spotting of blood in your stool or on the toilet paper. If you underwent a bowel prep for your procedure, then you may not have a normal bowel movement for a few days.  DIET: Your first meal following the procedure should be a light meal and then it is ok to progress to your normal diet.  A half-sandwich or bowl of soup is an example of a good first meal.  Heavy or fried foods are harder to digest and may make you feel nauseous or bloated.  Likewise meals heavy in dairy and vegetables can cause extra gas to form and this can also increase the bloating.  Drink plenty of fluids but you should avoid alcoholic beverages for 24 hours.  ACTIVITY: Your care partner should take you home directly after the procedure.  You should plan to take it easy, moving slowly for the rest of the day.  You can resume normal activity the day after the procedure however you should NOT DRIVE or use heavy machinery for 24 hours (because of the sedation medicines used during the test).    SYMPTOMS TO REPORT IMMEDIATELY: A gastroenterologist can be reached at any hour.  During normal business hours, 8:30 AM to 5:00 PM Monday through Friday,  call [phone].  After hours and on weekends, please call the GI answering service at [phone] who will take a message and have the physician on call contact you.   Following lower endoscopy (colonoscopy or flexible sigmoidoscopy):  Excessive amounts of blood in the stool  Significant tenderness or worsening of abdominal pains  Swelling of the abdomen that is new, acute  Fever of 100F or higher    FOLLOW UP: If any biopsies were taken you will be contacted by phone or by letter within the next 1-3 weeks.  Call your gastroenterologist if you have not heard about the biopsies in 3 weeks.  Our staff will call the home number listed on your records the next business day following your procedure to check on you and address any questions or concerns that you may have at that time regarding the information given to you following your procedure. This is a courtesy call and so if there is no answer at the home number and we have not heard from you through the emergency physician on call, we will assume that you have returned to your regular daily activities without incident.  SIGNATURES/CONFIDENTIALITY: You and/or your care partner have signed paperwork which will be entered into your electronic medical record.  These signatures attest to the fact that that the information above on your After Visit Summary has been reviewed and is understood.  Full responsibility of the confidentiality   of this discharge information lies with you and/or your care-partner.     

## 2013-09-04 NOTE — Op Note (Signed)
[facility] [address] [place], [address]   COLONOSCOPY PROCEDURE REPORT  PATIENT: Candace, Middleton  MR#: [id number] BIRTHDATE: 16-Jan-1961 , 52  yrs. old GENDER: Female ENDOSCOPIST: Roxy Cedar, MD REFERRED [id number] Program Recall PROCEDURE DATE:  09/04/2013 PROCEDURE:   Colonoscopy, surveillance First Screening Colonoscopy - Avg.  risk and is 50 yrs.  old or older - No.  Prior Negative Screening - Now for repeat screening. N/A  History of Adenoma - Now for follow-up colonoscopy & has been > or = to 3 yrs.  Yes hx of adenoma.  Has been 3 or more years since last colonoscopy.  Polyps Removed Today? No.  Recommend repeat exam, <10 yrs? No. ASA CLASS:   Class II INDICATIONS:Patient's personal history of adenomatous colon polyps. Index exam 2001 (TA) and f/u 2004 (Normal) - Dr Madilyn Fireman MEDICATIONS: MAC sedation, administered by CRNA and propofol (Diprivan) 300mg  IV  DESCRIPTION OF PROCEDURE:   After the risks benefits and alternatives of the procedure were thoroughly explained, informed consent was obtained.  A digital rectal exam revealed no abnormalities of the rectum.   The LB [id number]  endoscope was introduced through the anus and advanced to the cecum, which was identified by both the appendix and ileocecal valve. No adverse events experienced.   The quality of the prep was excellent, using MoviPrep  The instrument was then slowly withdrawn as the colon was fully examined.      COLON FINDINGS: A normal appearing cecum, ileocecal valve, and appendiceal orifice were identified.  The ascending, hepatic flexure, transverse, splenic flexure, descending, sigmoid colon and rectum appeared unremarkable.  No polyps or cancers were seen. Retroflexed views revealed internal hemorrhoids. The time to cecum=4 minutes 15 seconds.  Withdrawal time=10 minutes 0 seconds. The scope was withdrawn and the procedure completed.  COMPLICATIONS: There  were no complications.  ENDOSCOPIC IMPRESSION: 1. Normal colon  RECOMMENDATIONS: 1. Continue current colorectal screening recommendations  with a repeat colonoscopy in 10 years.   eSigned:  Roxy Cedar, MD 09/04/2013 12:15 PM   cc: Ellamae Sia, MD and The Patient

## 2013-09-05 ENCOUNTER — Telehealth: Payer: Self-pay | Admitting: *Deleted

## 2013-09-05 NOTE — Telephone Encounter (Signed)
  Follow up Call-  Call back number 09/04/2013  Post procedure Call Back phone  # [phone]  Permission to leave phone message Yes     Patient questions:  Do you have a fever, pain , or abdominal swelling? no Pain Score  0 *  Have you tolerated food without any problems? yes  Have you been able to return to your normal activities? yes  Do you have any questions about your discharge instructions: Diet   no Medications  no Follow up visit  no  Do you have questions or concerns about your Care? no  Actions: * If pain score is 4 or above: No action needed, pain <4.

## 2013-09-14 ENCOUNTER — Ambulatory Visit (INDEPENDENT_AMBULATORY_CARE_PROVIDER_SITE_OTHER): Payer: BC Managed Care – PPO | Admitting: Family Medicine

## 2013-09-14 VITALS — BP 100/70 | HR 73 | Temp 98.0°F | Resp 16 | Ht 60.0 in | Wt 99.0 lb

## 2013-09-14 DIAGNOSIS — F17200 Nicotine dependence, unspecified, uncomplicated: Secondary | ICD-10-CM

## 2013-09-14 DIAGNOSIS — R059 Cough, unspecified: Secondary | ICD-10-CM

## 2013-09-14 DIAGNOSIS — R05 Cough: Secondary | ICD-10-CM

## 2013-09-14 DIAGNOSIS — J441 Chronic obstructive pulmonary disease with (acute) exacerbation: Secondary | ICD-10-CM

## 2013-09-14 MED ORDER — ALBUTEROL SULFATE (2.5 MG/3ML) 0.083% IN NEBU
2.5000 mg | INHALATION_SOLUTION | Freq: Once | RESPIRATORY_TRACT | Status: AC
  Administered 2013-09-14: 2.5 mg via RESPIRATORY_TRACT

## 2013-09-14 MED ORDER — IPRATROPIUM BROMIDE 0.02 % IN SOLN
0.5000 mg | Freq: Once | RESPIRATORY_TRACT | Status: AC
  Administered 2013-09-14: 0.5 mg via RESPIRATORY_TRACT

## 2013-09-14 MED ORDER — DOXYCYCLINE HYCLATE 100 MG PO CAPS
100.0000 mg | ORAL_CAPSULE | Freq: Two times a day (BID) | ORAL | Status: DC
Start: 2013-09-14 — End: 2014-06-19

## 2013-09-14 MED ORDER — HYDROCODONE-HOMATROPINE 5-1.5 MG/5ML PO SYRP: 5.0000 mL | ORAL_SOLUTION | Freq: Three times a day (TID) | ORAL | Status: DC | PRN

## 2013-09-14 MED ORDER — BENZONATATE 200 MG PO CAPS: 200.0000 mg | ORAL_CAPSULE | Freq: Three times a day (TID) | ORAL | Status: DC | PRN

## 2013-09-14 MED ORDER — PREDNISONE 20 MG PO TABS: 40.0000 mg | ORAL_TABLET | Freq: Every day | ORAL | Status: DC

## 2013-09-14 MED ORDER — METHYLPREDNISOLONE SODIUM SUCC 125 MG IJ SOLR
125.0000 mg | Freq: Once | INTRAMUSCULAR | Status: AC
  Administered 2013-09-14: 125 mg via INTRAMUSCULAR

## 2013-09-14 NOTE — Progress Notes (Signed)
Subjective:    Patient ID: Candace Middleton, female    DOB: 1961/06/17, 52 y.o.   MRN: [id number] Chief Complaint  Patient presents with  . Follow-up    cough   HPI  Still coughing and a lot of pressure in chest and very short of breath, very weak and dyspnea, very fatigued. Constant CP.  The inhaler helps immed after use.  Occ swelling in LLleg from RSD at baseline.  Coughs a lot when she lays down - coughs all night.  Out of the cough medications but would still cough through them.  Even winded after she took a shower.  She is having hot flashes and night sweats just over the past few weeks with this illness as well.   Major constipation and stomach issues. Mother with CHF in her 47s and sister with heart cath in late 25s - has very bad pathological cholesterol.  Pt's chol was checked 3 mos ago and had LDL in 160s but was otherwise.  Father passed at 77 yo from PE. Daughter with cervical cancer in [facility].   Her husband saw Dr. Katrinka Blazing and Alycia Rossetti last night for a thrombosed hemorrhoid.  Past Medical History  Diagnosis Date  . Neuromuscular disorder 2014    RSD - nerve blocks in back for treatment   . Cancer     bladder ca   Current Outpatient Prescriptions on File Prior to Visit  Medication Sig Dispense Refill  . albuterol (PROVENTIL HFA;VENTOLIN HFA) 108 (90 BASE) MCG/ACT inhaler Inhale 2 puffs into the lungs every 4 (four) hours as needed for wheezing (cough, shortness of breath or wheezing.).  1 Inhaler  1  . benzonatate (TESSALON) 200 MG capsule Take 1 capsule (200 mg total) by mouth 3 (three) times daily as needed for cough.  30 capsule  0  . clonazePAM (KLONOPIN) 0.5 MG tablet Take 1 tablet (0.5 mg total) by mouth 2 (two) times daily as needed for anxiety.  30 tablet  5  . mirabegron ER (MYRBETRIQ) 50 MG TB24 Take by mouth daily.      . pregabalin (LYRICA) 100 MG capsule Take 100 mg by mouth 2 (two) times daily.      Marland Kitchen tiZANidine (ZANAFLEX) 4 MG tablet Take 4 mg by mouth 3 (three)  times daily.      Marland Kitchen azithromycin (ZITHROMAX) 250 MG tablet Take 2 tabs PO x 1 dose, then 1 tab PO QD x 4 days  6 tablet  0  . HYDROcodone-homatropine (HYCODAN) 5-1.5 MG/5ML syrup Take 5 mLs by mouth every 8 (eight) hours as needed for cough.  120 mL  0   No current facility-administered medications on file prior to visit.   Allergies  Allergen Reactions  . Sulfa Antibiotics Anaphylaxis     Review of Systems  Constitutional: Positive for diaphoresis, activity change and fatigue. Negative for fever, chills and appetite change.  HENT: Positive for congestion and rhinorrhea. Negative for sore throat.   Respiratory: Positive for cough, chest tightness and shortness of breath. Negative for wheezing.   Cardiovascular: Positive for chest pain and leg swelling. Negative for palpitations.  Neurological: Negative for syncope.  Hematological: Positive for adenopathy.  Psychiatric/Behavioral: Positive for sleep disturbance.       BP 100/70  Pulse 73  Temp(Src) 98 F (36.7 C) (Oral)  Resp 16  Ht 5' (1.524 m)  Wt 99 lb (44.906 kg)  BMI 19.33 kg/m2  SpO2 100% Objective:   Physical Exam  Constitutional: She is oriented to person,  place, and time. She appears well-developed and well-nourished. She appears ill. No distress.  HENT:  Head: Normocephalic and atraumatic.  Right Ear: External ear and ear canal normal. Tympanic membrane is retracted. A middle ear effusion is present.  Left Ear: External ear and ear canal normal. Tympanic membrane is retracted. A middle ear effusion is present.  Nose: Nose normal.  Mouth/Throat: Uvula is midline and mucous membranes are normal. Posterior oropharyngeal erythema present. No oropharyngeal exudate, posterior oropharyngeal edema or tonsillar abscesses.  Eyes: Conjunctivae are normal. Right eye exhibits no discharge. Left eye exhibits no discharge. No scleral icterus.  Neck: Normal range of motion. Neck supple. No thyromegaly present.  Cardiovascular:  Normal rate, regular rhythm, normal heart sounds and intact distal pulses.   Pulmonary/Chest: Effort normal. No accessory muscle usage. No respiratory distress. She has no decreased breath sounds. She has no wheezes. She has no rhonchi. She has no rales.  Not wearing oxygen. Talks in complete sentences and walks through clinic w/o apparent dyspnea.  Musculoskeletal: She exhibits no edema and no tenderness.  Lymphadenopathy:       Head (right side): Submandibular adenopathy present. No preauricular and no posterior auricular adenopathy present.       Head (left side): Submandibular adenopathy present. No preauricular and no posterior auricular adenopathy present.    She has no cervical adenopathy.       Right: No supraclavicular adenopathy present.       Left: No supraclavicular adenopathy present.  Neurological: She is alert and oriented to person, place, and time.  Skin: Skin is warm and dry. She is not diaphoretic. No erythema.  Psychiatric: She has a normal mood and affect. Her behavior is normal.     peak flow: 215 Assessment & Plan:  Cough - Plan: albuterol (PROVENTIL) (2.5 MG/3ML) 0.083% nebulizer solution 2.5 mg, ipratropium (ATROVENT) nebulizer solution 0.5 mg, methylPREDNISolone sodium succinate (SOLU-MEDROL) 125 mg/2 mL injection 125 mg  COPD exacerbation If no improvement, RTC for further eval with repeat CXR and CBC, consider cardiac eval.   Tobacco use - Encouraged cessation, cut down to 5 cigs/d.  Meds ordered this encounter  Medications  . albuterol (PROVENTIL) (2.5 MG/3ML) 0.083% nebulizer solution 2.5 mg    Sig:   . ipratropium (ATROVENT) nebulizer solution 0.5 mg    Sig:   . methylPREDNISolone sodium succinate (SOLU-MEDROL) 125 mg/2 mL injection 125 mg    Sig:   . benzonatate (TESSALON) 200 MG capsule    Sig: Take 1 capsule (200 mg total) by mouth 3 (three) times daily as needed for cough.    Dispense:  30 capsule    Refill:  0  . HYDROcodone-homatropine (HYCODAN)  5-1.5 MG/5ML syrup    Sig: Take 5 mLs by mouth every 8 (eight) hours as needed for cough.    Dispense:  120 mL    Refill:  0  . predniSONE (DELTASONE) 20 MG tablet    Sig: Take 2 tablets (40 mg total) by mouth daily.    Dispense:  10 tablet    Refill:  0  . doxycycline (VIBRAMYCIN) 100 MG capsule    Sig: Take 1 capsule (100 mg total) by mouth 2 (two) times daily.    Dispense:  14 capsule    Refill:  0

## 2013-09-14 NOTE — Patient Instructions (Addendum)
If you are not feeling better in 2-3 weeks, come back to clinic for further eval. If you are getting at all worse, come back sooner. If you are feeling better, come back after you feel back to baseline for lung testing.  Chronic Obstructive Pulmonary Disease Chronic obstructive pulmonary disease (COPD) is a condition in which airflow from the lungs is restricted. The lungs can never return to normal, but there are measures you can take which will improve them and make you feel better. CAUSES   Smoking.  Exposure to secondhand smoke.  Breathing in irritants such as air pollution, dust, cigarette smoke, strong odors, aerosol sprays, or paint fumes.  History of lung infections. SYMPTOMS   Deep, persistent (chronic) cough with a large amount of thick mucus.  Wheezing.  Shortness of breath, especially with physical activity.  Feeling like you cannot get enough air.  Difficulty breathing.  Rapid breaths (tachypnea).  Gray or bluish discoloration (cyanosis) of the skin, especially in fingers, toes, or lips.  Fatigue.  Weight loss.  Swelling in legs, ankles, or feet.  Fast heartbeat (tachycardia).  Frequent lung infections.   Chest tightness. DIAGNOSIS  Initial diagnosis may be based on your history, symptoms, and physical examination. Additional tests for COPD may include:  Chest X-ray.  Computed tomography (CT) scan.  Lung (pulmonary) function tests.  Blood tests. TREATMENT  Treatment focuses on making you comfortable (supportive care). Your caregiver may prescribe medicines (inhaled or pills) to help improve your breathing. Additional treatment options may include oxygen therapy and pulmonary rehabilitation. Treatment should also include reducing your exposure to known irritants and following a plan to stop smoking. HOME CARE INSTRUCTIONS   Take all medicines, including antibiotic medicines, as directed by your caregiver.  Use inhaled medicines as directed by your  caregiver.  Avoid medicines or cough syrups that dry up your airway (antihistamines) and slow down the elimination of secretions. This decreases respiratory capacity and may lead to infections.  If you smoke, stop smoking.  Avoid exposure to smoke, chemicals, and fumes that aggravate your breathing.  Avoid contact with individuals that have a contagious illness.  Avoid extreme temperature and humidity changes.  Use humidifiers at home and at your bedside if they do not make breathing difficult.  Drink enough water and fluids to keep your urine clear or pale yellow. This loosens secretions.  Eat healthy foods. Eating smaller, more frequent meals and resting before meals may help you maintain your strength.  Ask your caregiver about the use of vitamins and mineral supplements.  Stay active. Exercise and physical activity will help maintain your ability to do things you want to do.  Balance activity with periods of rest.  Assume a position of comfort if you become short of breath.  Learn and use relaxation techniques.  Learn and use controlled breathing techniques as directed by your caregiver. Controlled breathing techniques include:  Pursed lip breathing. This breathing technique starts with breathing in (inhaling) through your nose for 1 second. Next, purse your lips as if you were going to whistle. Then breathe out (exhale) through the pursed lips for 2 seconds.  Diaphragmatic breathing. Start by putting one hand on your abdomen just above your waist. Inhale slowly through your nose. The hand on your abdomen should move out. Then exhale slowly through pursed lips. You should be able to feel the hand on your abdomen moving in as you exhale.  Learn and use controlled coughing to clear mucus from your lungs. Controlled coughing is  a series of short, progressive coughs. The steps of controlled coughing are: 1. Lean your head slightly forward. 2. Breathe in deeply using diaphragmatic  breathing. 3. Try to hold your breath for 3 seconds. 4. Keep your mouth slightly open while coughing twice. 5. Spit any mucus out into a tissue. 6. Rest and repeat the steps once or twice as needed.  Receive all protective vaccines your caregiver suggests, especially pneumococcal and influenza vaccines.  Learn to manage stress.  Schedule and attend all follow-up appointments as directed by your caregiver. It is important to keep all your appointments.  Participate in pulmonary rehabilitation as directed by your caregiver.  Use home oxygen as suggested. SEEK MEDICAL CARE IF:   You are coughing up more mucus than usual.  There is a change in the color or thickness of the mucus.  Breathing is more labored than usual.  Your breathing is faster than usual.  Your skin color is more cyanotic than usual.  You are running out of the medicine you take for your breathing.  You are anxious, apprehensive, or restless.  You have a fever. SEEK IMMEDIATE MEDICAL CARE IF:   You have a rapid heart rate.  You have shortness of breath while you are resting.  You have shortness of breath that prevents you from being able to talk.  You have shortness of breath that prevents you from performing your usual physical activities.  You have chest pain lasting longer than 5 minutes.  You have a seizure.  Your family or friends notice that you are agitated or confused. MAKE SURE YOU:   Understand these instructions.  Will watch your condition.  Will get help right away if you are not doing well or get worse. Document Released: 09/20/2005 Document Revised: 09/04/2012 Document Reviewed: 02/10/2011 [facility] Patient Information 2014 [place], [place].

## 2013-10-07 ENCOUNTER — Other Ambulatory Visit: Payer: Self-pay | Admitting: Pain Medicine

## 2013-10-07 LAB — SEDIMENTATION RATE: Erythrocyte Sed Rate: 2 mm/hr (ref 0–30)

## 2013-10-28 ENCOUNTER — Ambulatory Visit: Payer: Self-pay | Admitting: Pain Medicine

## 2013-12-08 ENCOUNTER — Ambulatory Visit: Payer: Self-pay | Admitting: Pain Medicine

## 2014-04-20 ENCOUNTER — Ambulatory Visit (INDEPENDENT_AMBULATORY_CARE_PROVIDER_SITE_OTHER): Payer: No Typology Code available for payment source | Admitting: Emergency Medicine

## 2014-04-20 ENCOUNTER — Ambulatory Visit: Payer: No Typology Code available for payment source

## 2014-04-20 VITALS — BP 110/78 | HR 82 | Temp 97.6°F | Resp 18 | Ht 60.0 in | Wt 95.0 lb

## 2014-04-20 DIAGNOSIS — R059 Cough, unspecified: Secondary | ICD-10-CM

## 2014-04-20 DIAGNOSIS — R0989 Other specified symptoms and signs involving the circulatory and respiratory systems: Secondary | ICD-10-CM

## 2014-04-20 DIAGNOSIS — R05 Cough: Secondary | ICD-10-CM

## 2014-04-20 LAB — POCT CBC
Granulocyte percent: 55.5 %G (ref 37–80)
HCT, POC: 46.6 % (ref 37.7–47.9)
Hemoglobin: 15.8 g/dL (ref 12.2–16.2)
Lymph, poc: 2.6 (ref 0.6–3.4)
MCH, POC: 31.8 pg — AB (ref 27–31.2)
MCHC: 33.9 g/dL (ref 31.8–35.4)
MCV: 93.7 fL (ref 80–97)
MID (cbc): 0.6 (ref 0–0.9)
MPV: 8.7 fL (ref 0–99.8)
POC Granulocyte: 3.9 (ref 2–6.9)
POC LYMPH PERCENT: 36.6 %L (ref 10–50)
POC MID %: 7.9 %M (ref 0–12)
Platelet Count, POC: 363 10*3/uL (ref 142–424)
RBC: 4.97 M/uL (ref 4.04–5.48)
RDW, POC: 12.9 %
WBC: 7.1 10*3/uL (ref 4.6–10.2)

## 2014-04-20 MED ORDER — ALBUTEROL SULFATE (2.5 MG/3ML) 0.083% IN NEBU
2.5000 mg | INHALATION_SOLUTION | Freq: Once | RESPIRATORY_TRACT | Status: AC
  Administered 2014-04-20: 2.5 mg via RESPIRATORY_TRACT

## 2014-04-20 MED ORDER — ALBUTEROL SULFATE HFA 108 (90 BASE) MCG/ACT IN AERS: 2.0000 | INHALATION_SPRAY | RESPIRATORY_TRACT | Status: DC | PRN

## 2014-04-20 MED ORDER — TIOTROPIUM BROMIDE MONOHYDRATE 18 MCG IN CAPS: 18.0000 ug | ORAL_CAPSULE | Freq: Every evening | RESPIRATORY_TRACT | Status: DC

## 2014-04-20 MED ORDER — PREDNISONE 10 MG PO TABS: ORAL_TABLET | ORAL | Status: DC

## 2014-04-20 MED ORDER — BENZONATATE 100 MG PO CAPS: 100.0000 mg | ORAL_CAPSULE | Freq: Three times a day (TID) | ORAL | Status: DC | PRN

## 2014-04-20 NOTE — Progress Notes (Signed)
Subjective:    Patient ID: Candace Middleton, female    DOB: 1961-12-05, 53 y.o.   MRN: [id number]  HPI  53 year old female presents to [facility] with chest congestion, nasal congestion, and cough Began with a sore throat 2.5 weeks ago that only lasted 3 days patient now has chest congestion, non productive cough, sinus pressure and pain, right ear pain No fever that patient is aware of, occasional chills  Patient smokes 5 cigarettes a day She use to smoke more.  Review of Systems     Objective:   Physical Exam patient is alert and cooperative with somewhat of a raspy voice. Her neck is supple. Chest exam . . There is a prolonged expiratory phase bilaterally with a mild wheeze noted.Marland Kitchen Heart is regular rate without murmurs appear. extremities are without edema Results for orders placed in visit on 04/20/14  POCT CBC      Result Value Ref Range   WBC 7.1  4.6 - 10.2 K/uL   Lymph, poc 2.6  0.6 - 3.4   POC LYMPH PERCENT 36.6  10 - 50 %L   MID (cbc) 0.6  0 - 0.9   POC MID % 7.9  0 - 12 %M   POC Granulocyte 3.9  2 - 6.9   Granulocyte percent 55.5  37 - 80 %G   RBC 4.97  4.04 - 5.48 M/uL   Hemoglobin 15.8  12.2 - 16.2 g/dL   HCT, POC 96.2  95.2 - 47.9 %   MCV 93.7  80 - 97 fL   MCH, POC 31.8 (*) 27 - 31.2 pg   MCHC 33.9  31.8 - 35.4 g/dL   RDW, POC 84.1     Platelet Count, POC 363  142 - 424 K/uL   MPV 8.7  0 - 99.8 fL  [facility] reading (PRIMARY) by  Dr.Jolisa Intriago patient shows signs of COPD. No pneumonic infiltrates or nodular lesions were seen.        Assessment & Plan:  Check a CBC chest x-ray treat with albuterol and get a baseline PFT. I do suspect she has underlying lung disease related to her long history of cigarette smoking. Will treat with Spiriva albuterol rescue inhaler tapered dose of prednisone and Tessalon Perles.

## 2014-04-20 NOTE — Patient Instructions (Signed)
Chronic Obstructive Pulmonary Disease  Chronic obstructive pulmonary disease (COPD) is a common lung condition in which airflow from the lungs is limited. COPD is a general term that can be used to describe many different lung problems that limit airflow, including both chronic bronchitis and emphysema.  If you have COPD, your lung function will probably never return to normal, but there are measures you can take to improve lung function and make yourself feel better.   CAUSES   · Smoking (common).    · Exposure to secondhand smoke.    · Genetic problems.  · Chronic inflammatory lung diseases or recurrent infections.  SYMPTOMS   · Shortness of breath, especially with physical activity.    · Deep, persistent (chronic) cough with a large amount of thick mucus.    · Wheezing.    · Rapid breaths (tachypnea).    · Gray or bluish discoloration (cyanosis) of the skin, especially in fingers, toes, or lips.    · Fatigue.    · Weight loss.    · Frequent infections or episodes when breathing symptoms become much worse (exacerbations).    · Chest tightness.  DIAGNOSIS   Your healthcare provider will take a medical history and perform a physical examination to make the initial diagnosis.  Additional tests for COPD may include:   · Lung (pulmonary) function tests.  · Chest X-ray.  · CT scan.  · Blood tests.  TREATMENT   Treatment available to help you feel better when you have COPD include:   · Inhaler and nebulizer medicines. These help manage the symptoms of COPD and make your breathing more comfortable  · Supplemental oxygen. Supplemental oxygen is only helpful if you have a low oxygen level in your blood.    · Exercise and physical activity. These are beneficial for nearly all people with COPD. Some people may also benefit from a pulmonary rehabilitation program.  HOME CARE INSTRUCTIONS   · Take all medicines (inhaled or pills) as directed by your health care provider.  · Only take over-the-counter or prescription medicines  for pain, fever, or discomfort as directed by your health care provider.    · Avoid over-the-counter medicines or cough syrups that dry up your airway (such as antihistamines) and slow down the elimination of secretions unless instructed otherwise by your healthcare provider.    · If you are a smoker, the most important thing that you can do is stop smoking. Continuing to smoke will cause further lung damage and breathing trouble. Ask your health care provider for help with quitting smoking. He or she can direct you to community resources or hospitals that provide support.  · Avoid exposure to irritants such as smoke, chemicals, and fumes that aggravate your breathing.  · Use oxygen therapy and pulmonary rehabilitation if directed by your health care provider. If you require home oxygen therapy, ask your healthcare provider whether you should purchase a pulse oximeter to measure your oxygen level at home.    · Avoid contact with individuals who have a contagious illness.  · Avoid extreme temperature and humidity changes.  · Eat healthy foods. Eating smaller, more frequent meals and resting before meals may help you maintain your strength.  · Stay active, but balance activity with periods of rest. Exercise and physical activity will help you maintain your ability to do things you want to do.  · Preventing infection and hospitalization is very important when you have COPD. Make sure to receive all the vaccines your health care provider recommends, especially the pneumococcal and influenza vaccines. Ask your healthcare provider whether you   need a pneumonia vaccine.  · Learn and use relaxation techniques to manage stress.  · Learn and use controlled breathing techniques as directed by your health care provider. Controlled breathing techniques include:    · Pursed lip breathing. Start by breathing in (inhaling) through your nose for 1 second. Then, purse your lips as if you were going to whistle and breathe out (exhale)  through the pursed lips for 2 seconds.    · Diaphragmatic breathing. Start by putting one hand on your abdomen just above your waist. Inhale slowly through your nose. The hand on your abdomen should move out. Then purse your lips and exhale slowly. You should be able to feel the hand on your abdomen moving in as you exhale.    · Learn and use controlled coughing to clear mucus from your lungs. Controlled coughing is a series of short, progressive coughs. The steps of controlled coughing are:    1. Lean your head slightly forward.    2. Breathe in deeply using diaphragmatic breathing.    3. Try to hold your breath for 3 seconds.    4. Keep your mouth slightly open while coughing twice.    5. Spit any mucus out into a tissue.    6. Rest and repeat the steps once or twice as needed.  SEEK MEDICAL CARE IF:   · You are coughing up more mucus than usual.    · There is a change in the color or thickness of your mucus.    · Your breathing is more labored than usual.    · Your breathing is faster than usual.    SEEK IMMEDIATE MEDICAL CARE IF:   · You have shortness of breath while you are resting.    · You have shortness of breath that prevents you from:  · Being able to talk.    · Performing your usual physical activities.    · You have chest pain lasting longer than 5 minutes.    · Your skin color is more cyanotic than usual.  · You measure low oxygen saturations for longer than 5 minutes with a pulse oximeter.  MAKE SURE YOU:   · Understand these instructions.  · Will watch your condition.  · Will get help right away if you are not doing well or get worse.  Document Released: 09/20/2005 Document Revised: 10/01/2013 Document Reviewed: 08/07/2013  [employer]® Patient Information ©2014 [employer].

## 2014-06-19 ENCOUNTER — Telehealth: Payer: Self-pay

## 2014-06-19 ENCOUNTER — Ambulatory Visit (INDEPENDENT_AMBULATORY_CARE_PROVIDER_SITE_OTHER): Payer: No Typology Code available for payment source | Admitting: Emergency Medicine

## 2014-06-19 VITALS — BP 110/74 | HR 69 | Temp 98.0°F | Resp 16 | Ht 60.0 in | Wt 94.6 lb

## 2014-06-19 DIAGNOSIS — B029 Zoster without complications: Secondary | ICD-10-CM

## 2014-06-19 DIAGNOSIS — I499 Cardiac arrhythmia, unspecified: Secondary | ICD-10-CM

## 2014-06-19 DIAGNOSIS — I459 Conduction disorder, unspecified: Secondary | ICD-10-CM

## 2014-06-19 DIAGNOSIS — R350 Frequency of micturition: Secondary | ICD-10-CM

## 2014-06-19 LAB — POCT URINALYSIS DIPSTICK
Bilirubin, UA: NEGATIVE
Blood, UA: NEGATIVE
Glucose, UA: NEGATIVE
Ketones, UA: NEGATIVE
Leukocytes, UA: NEGATIVE
Nitrite, UA: NEGATIVE
Protein, UA: NEGATIVE
Spec Grav, UA: <=1.005
Urobilinogen, UA: 0.2
pH, UA: 5

## 2014-06-19 LAB — POCT URINE PREGNANCY: Preg Test, Ur: NEGATIVE

## 2014-06-19 MED ORDER — VALACYCLOVIR HCL 1 G PO TABS: 1000.0000 mg | ORAL_TABLET | Freq: Three times a day (TID) | ORAL | Status: DC

## 2014-06-19 NOTE — Progress Notes (Addendum)
Subjective:  This chart was scribed for Candace Chris, MD by Elveria Rising, Medial Scribe. This patient was seen in room 14 and the patient's care was started at 11:56 AM.    Patient ID: Candace Middleton, female    DOB: 13-May-1961, 53 y.o.   MRN: [id number]  HPI HPI Comments: Candace Middleton is a 53 y.o. female who presents to the [facility] complaining of urinary symptoms including urinary frequency and urgency ongoing for three days. Patient shares history of UTIs and her current symptoms are consistent with previous occurrences. Patient denies fever, chills, and hematuria.   Flank pain Patient reports pain with tactile pressure on her right flank. Patient states that her skin is so sensitive that her clothing is an irritant. Patient suspects she is having a singles outbreak. Patient denies presence of rash or blisters. She denies receipt of shingles vaccination. She reports family history of shingles in her mother and sister.   Patient shares recently flying back from [place] 3 days ago all her symptoms presented after her retrun.   Patient Active Problem List   Diagnosis Date Noted  . Nicotine addiction 06/24/2013  . RSD (reflex sympathetic dystrophy) 06/24/2013  . Complex regional pain syndrome of left lower extremity 06/24/2013   Past Medical History  Diagnosis Date  . Neuromuscular disorder 2014    RSD - nerve blocks in back for treatment   . Cancer     bladder ca   Past Surgical History  Procedure Laterality Date  . Bladder cancer  2006  . Breast surgery  "long time ago"    lumpectomy   . Cesarean section  1982   Allergies  Allergen Reactions  . Sulfa Antibiotics Anaphylaxis   Prior to Admission medications   Medication Sig Start Date End Date Taking? Authorizing Orlanda Lemmerman  albuterol (PROVENTIL HFA;VENTOLIN HFA) 108 (90 BASE) MCG/ACT inhaler Inhale 2 puffs into the lungs every 4 (four) hours as needed for wheezing (cough, shortness of breath or  wheezing.). 04/20/14   Collene Gobble, MD  benzonatate (TESSALON) 100 MG capsule Take 1-2 capsules (100-200 mg total) by mouth 3 (three) times daily as needed for cough. 04/20/14   Collene Gobble, MD  benzonatate (TESSALON) 200 MG capsule Take 1 capsule (200 mg total) by mouth 3 (three) times daily as needed for cough. 09/14/13   Sherren Mocha, MD  clonazePAM (KLONOPIN) 0.5 MG tablet Take 1 tablet (0.5 mg total) by mouth 2 (two) times daily as needed for anxiety. 06/24/13   Tonye Pearson, MD  doxycycline (VIBRAMYCIN) 100 MG capsule Take 1 capsule (100 mg total) by mouth 2 (two) times daily. 09/14/13   Sherren Mocha, MD  HYDROcodone-acetaminophen (NORCO/VICODIN) 5-325 MG per tablet Take 1 tablet by mouth every 6 (six) hours as needed for moderate pain.    Historical Tawn Fitzner, MD  HYDROcodone-homatropine (HYCODAN) 5-1.5 MG/5ML syrup Take 5 mLs by mouth every 8 (eight) hours as needed for cough. 09/14/13   Sherren Mocha, MD  mirabegron ER (MYRBETRIQ) 50 MG TB24 Take by mouth daily.    Historical Bronco Mcgrory, MD  predniSONE (DELTASONE) 10 MG tablet Take 4 tablets for 3 days; 3 tablets for 3 days, 2 tablets for 3 days and 1 tablet for 3 days 04/20/14   Collene Gobble, MD  predniSONE (DELTASONE) 20 MG tablet Take 2 tablets (40 mg total) by mouth daily. 09/14/13   Sherren Mocha, MD  pregabalin (LYRICA) 100 MG capsule Take 100 mg by  mouth 2 (two) times daily.    Historical Jorgina Binning, MD  tiotropium (SPIRIVA HANDIHALER) 18 MCG inhalation capsule Place 1 capsule (18 mcg total) into inhaler and inhale Nightly. 04/20/14   Collene Gobble, MD  tiZANidine (ZANAFLEX) 4 MG tablet Take 4 mg by mouth 3 (three) times daily.    Historical Aviv Rota, MD   History   Social History  . Marital Status: Married    Spouse Name: N/A    Number of Children: N/A  . Years of Education: N/A   Occupational History  . 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 Smoking status: Current Some Day  Smoker -- 0.25 packs/day  . Smokeless tobacco: Never Used  . Alcohol Use: No  . Drug Use: No  . Sexual Activity: Yes   Other Topics Concern  . Not on file   Social History Narrative   Husband is currently undergoing chemotherapy for lung cancer. Son has recently been diagnosed with optic neuritis and has multi-handicaps.        Review of Systems  Constitutional: Negative for fever and chills.  Genitourinary: Positive for urgency and frequency. Negative for dysuria and hematuria.  Musculoskeletal: Negative for back pain.  Skin: Negative for rash.       Objective:   Physical Exam  Nursing note and vitals reviewed.   CONSTITUTIONAL: Well developed/well nourished HEAD: Normocephalic/atraumatic EYES: EOMI/PERRL ENMT: Mucous membranes moist NECK: supple no meningeal signs SPINE:entire spine nontender CV: S1/S2 noted, no murmurs/rubs/gallops noted; frequent skipped beats LUNGS: Lungs are clear to auscultation bilaterally, no apparent distress ABDOMEN: soft, nontender, no rebound or guarding GU:no cva tenderness;  NEURO: Pt is awake/alert, moves all extremitiesx4 EXTREMITIES: pulses normal, full ROM SKIN: warm, color normal; tenderness to skin T8 distrubution on the right no rash seen  PSYCH: no abnormalities of mood noted  Filed Vitals:   06/19/14 1145  BP: 110/74  Pulse: 69  Temp: 98 F (36.7 C)  Resp: 16        Results for orders placed in visit on 06/19/14  POCT URINALYSIS DIPSTICK      Result Value Ref Range   Color, UA yellow     Clarity, UA clear     Glucose, UA neg     Bilirubin, UA neg     Ketones, UA neg     Spec Grav, UA <=1.005     Blood, UA neg     pH, UA 5.0     Protein, UA neg     Urobilinogen, UA 0.2     Nitrite, UA neg     Leukocytes, UA Negative    POCT URINE PREGNANCY      Result Value Ref Range   Preg Test, Ur Negative      Assessment & Plan:  EKG shows occasional PVCs. Her exam would be most consistent with shingles. She has  exquisite skin sensitivity on examination. Will have her avoid stimulants. She can take Pyridium for the dysuria. She will be on Valtrex 1 g 3 times a day .Marland Kitchen

## 2014-06-19 NOTE — Telephone Encounter (Signed)
Patient was seen this morning by Dr. Everlene Farrier.  Her urine did not turn up anything.  However, now she has blood in her urine.   Please call [phone]

## 2014-06-19 NOTE — Patient Instructions (Signed)

## 2014-06-21 ENCOUNTER — Other Ambulatory Visit: Payer: Self-pay | Admitting: *Deleted

## 2014-06-21 DIAGNOSIS — N390 Urinary tract infection, site not specified: Secondary | ICD-10-CM

## 2014-06-21 LAB — URINE CULTURE: Colony Count: >=100000

## 2014-06-21 MED ORDER — CIPROFLOXACIN HCL 250 MG PO TABS: 250.0000 mg | ORAL_TABLET | Freq: Two times a day (BID) | ORAL | Status: DC

## 2014-08-13 ENCOUNTER — Emergency Department (HOSPITAL_COMMUNITY): Payer: No Typology Code available for payment source

## 2014-08-13 ENCOUNTER — Emergency Department (HOSPITAL_COMMUNITY)
Admission: EM | Admit: 2014-08-13 | Discharge: 2014-08-13 | Disposition: A | Discharge status: Home / Self Care | Payer: No Typology Code available for payment source | Attending: Emergency Medicine | Admitting: Emergency Medicine

## 2014-08-13 ENCOUNTER — Encounter (HOSPITAL_COMMUNITY): Payer: Self-pay | Admitting: Emergency Medicine

## 2014-08-13 DIAGNOSIS — Z8669 Personal history of other diseases of the nervous system and sense organs: Secondary | ICD-10-CM | POA: Diagnosis not present

## 2014-08-13 DIAGNOSIS — R110 Nausea: Secondary | ICD-10-CM | POA: Insufficient documentation

## 2014-08-13 DIAGNOSIS — Z79899 Other long term (current) drug therapy: Secondary | ICD-10-CM | POA: Insufficient documentation

## 2014-08-13 DIAGNOSIS — R1011 Right upper quadrant pain: Secondary | ICD-10-CM | POA: Insufficient documentation

## 2014-08-13 DIAGNOSIS — M549 Dorsalgia, unspecified: Secondary | ICD-10-CM | POA: Diagnosis not present

## 2014-08-13 DIAGNOSIS — F17200 Nicotine dependence, unspecified, uncomplicated: Secondary | ICD-10-CM | POA: Insufficient documentation

## 2014-08-13 DIAGNOSIS — Z8551 Personal history of malignant neoplasm of bladder: Secondary | ICD-10-CM | POA: Insufficient documentation

## 2014-08-13 DIAGNOSIS — Z9889 Other specified postprocedural states: Secondary | ICD-10-CM | POA: Diagnosis not present

## 2014-08-13 LAB — CBC WITH DIFFERENTIAL/PLATELET
Basophils Absolute: 0 10*3/uL (ref 0.0–0.1)
Basophils Relative: 0 % (ref 0–1)
Eosinophils Absolute: 0.1 10*3/uL (ref 0.0–0.7)
Eosinophils Relative: 1 % (ref 0–5)
HCT: 43.6 % (ref 36.0–46.0)
Hemoglobin: 15.2 g/dL — ABNORMAL HIGH (ref 12.0–15.0)
Lymphocytes Relative: 39 % (ref 12–46)
Lymphs Abs: 3 10*3/uL (ref 0.7–4.0)
MCH: 31.7 pg (ref 26.0–34.0)
MCHC: 34.9 g/dL (ref 30.0–36.0)
MCV: 91 fL (ref 78.0–100.0)
Monocytes Absolute: 0.8 10*3/uL (ref 0.1–1.0)
Monocytes Relative: 10 % (ref 3–12)
Neutro Abs: 3.7 10*3/uL (ref 1.7–7.7)
Neutrophils Relative %: 50 % (ref 43–77)
Platelets: 250 10*3/uL (ref 150–400)
RBC: 4.79 MIL/uL (ref 3.87–5.11)
RDW: 13.2 % (ref 11.5–15.5)
WBC: 7.6 10*3/uL (ref 4.0–10.5)

## 2014-08-13 LAB — URINALYSIS, ROUTINE W REFLEX MICROSCOPIC
Bilirubin Urine: NEGATIVE
Glucose, UA: NEGATIVE mg/dL
Hgb urine dipstick: NEGATIVE
Ketones, ur: NEGATIVE mg/dL
Leukocytes, UA: NEGATIVE
Nitrite: NEGATIVE
Protein, ur: NEGATIVE mg/dL
Specific Gravity, Urine: 1.008 (ref 1.005–1.030)
Urobilinogen, UA: 0.2 mg/dL (ref 0.0–1.0)
pH: 5.5 (ref 5.0–8.0)

## 2014-08-13 LAB — COMPREHENSIVE METABOLIC PANEL
ALT: 10 U/L (ref 0–35)
AST: 14 U/L (ref 0–37)
Albumin: 3.8 g/dL (ref 3.5–5.2)
Alkaline Phosphatase: 67 U/L (ref 39–117)
Anion gap: 12 (ref 5–15)
BUN: 11 mg/dL (ref 6–23)
CO2: 25 mEq/L (ref 19–32)
Calcium: 9.4 mg/dL (ref 8.4–10.5)
Chloride: 103 mEq/L (ref 96–112)
Creatinine, Ser: 0.68 mg/dL (ref 0.50–1.10)
GFR calc Af Amer: >90 mL/min (ref >90)
GFR calc non Af Amer: >90 mL/min (ref >90)
Glucose, Bld: 83 mg/dL (ref 70–99)
Potassium: 4.2 mEq/L (ref 3.7–5.3)
Sodium: 140 mEq/L (ref 137–147)
Total Bilirubin: 0.3 mg/dL (ref 0.3–1.2)
Total Protein: 6.3 g/dL (ref 6.0–8.3)

## 2014-08-13 LAB — LIPASE, BLOOD: Lipase: 23 U/L (ref 11–59)

## 2014-08-13 MED ORDER — ONDANSETRON HCL 4 MG/2ML IJ SOLN
4.0000 mg | Freq: Once | INTRAMUSCULAR | Status: AC
  Administered 2014-08-13: 4 mg via INTRAVENOUS
  Filled 2014-08-13: qty 2

## 2014-08-13 MED ORDER — HYDROMORPHONE HCL PF 1 MG/ML IJ SOLN
1.0000 mg | Freq: Once | INTRAMUSCULAR | Status: AC
  Administered 2014-08-13: 1 mg via INTRAVENOUS
  Filled 2014-08-13: qty 1

## 2014-08-13 NOTE — Discharge Instructions (Signed)
You were seen today in the ED for your right upper quadrant abdominal pain. As we discussed, this may be shingles or further work-up such as a HIDA scan may be warranted at the discretion of your PCP. Please schedule follow-up within one week with your PCP. Please seek medical attention or return to the ED if you have new or worsening abdominal pain, chest pain, nausea, vomiting, fever, or other worrisome medical condition.

## 2014-08-13 NOTE — ED Notes (Signed)
Pt having RUQ pain since yesterday. sts burning on the inside radiating into back. sts some nausea. sts weak.

## 2014-08-13 NOTE — ED Provider Notes (Signed)
I saw and evaluated the patient, reviewed the resident's note and I agree with the findings and plan.   EKG Interpretation None     Rhinocort abdominal pain with tenderness. Ultrasound lab work reassuring. Has had zoster in this area previously. May be related to this. Will discharge home to followup as needed  Jasper Riling. Alvino Chapel, MD 08/13/14 1540

## 2014-08-13 NOTE — ED Provider Notes (Signed)
CSN: [id number]     Arrival date & time 08/13/14  1004 History   First MD Initiated Contact with Patient 08/13/14 1009     Chief Complaint  Patient presents with  . Abdominal Pain     (Consider location/radiation/quality/duration/timing/severity/associated sxs/prior Treatment) Patient is a 53 y.o. female presenting with abdominal pain.  Abdominal Pain Associated symptoms: nausea   Associated symptoms: no chest pain, no chills, no constipation, no diarrhea, no dysuria, no fever, no shortness of breath and no vomiting     Ms Stoneberg is a 53 year old woman with RSD, hx of bladder cancer in remission (2006), shingles who presents with one day RUQ abdominal pain. She woke up with the pain yesterday morning and thinks it has gotten worse. It has been present since and waxes and wanes in intensity. She feels like it wraps around her side to her back and involves the bottom of the rib cage. She thinks it is worse with inspiration. She has had this pain in the past but never been evaluated for it. She also notes some nausea without emesis. No drug or alcohol use. She denies substernal chest pain, SOB, diaphoresis, fever, hematochezia, melena.  Past Medical History  Diagnosis Date  . Neuromuscular disorder 2014    RSD - nerve blocks in back for treatment   . Cancer     bladder ca   Past Surgical History  Procedure Laterality Date  . Bladder cancer  2006  . Breast surgery  "long time ago"    lumpectomy   . Cesarean section  1982   Family History  Problem Relation Age of Onset  . Heart disease Mother   . Thyroid disease Sister   . Diabetes Brother   . Epilepsy Son   . Early death Father   . Pulmonary embolism Father   . Colon cancer Neg Hx    History  Substance Use Topics  . Smoking status: Current Some Day Smoker -- 0.25 packs/day  . Smokeless tobacco: Never Used  . Alcohol Use: No   OB History   Grav Para Term Preterm Abortions TAB SAB Ect Mult Living                  Review of Systems  Constitutional: Negative for fever, chills and diaphoresis.       10 lb weight loss in past year  Respiratory: Negative for shortness of breath.   Cardiovascular: Negative for chest pain and palpitations.  Gastrointestinal: Positive for nausea and abdominal pain. Negative for vomiting, diarrhea, constipation and blood in stool.  Endocrine: Negative for polyuria.  Genitourinary: Negative for dysuria.  Musculoskeletal: Positive for back pain.  Neurological: Negative for dizziness, syncope, weakness and light-headedness.      Allergies  Sulfa antibiotics  Home Medications   Prior to Admission medications   Medication Sig Start Date End Date Taking? Authorizing Provider  albuterol (PROVENTIL HFA;VENTOLIN HFA) 108 (90 BASE) MCG/ACT inhaler Inhale 2 puffs into the lungs every 4 (four) hours as needed for wheezing (cough, shortness of breath or wheezing.). 04/20/14  Yes Darlyne Russian, MD  tiotropium (SPIRIVA HANDIHALER) 18 MCG inhalation capsule Place 1 capsule (18 mcg total) into inhaler and inhale Nightly. 04/20/14  Yes Darlyne Russian, MD   BP 112/67  Pulse 59  Temp(Src) 98 F (36.7 C) (Oral)  Resp 18  SpO2 97% Physical Exam  Vitals reviewed. Constitutional: She is oriented to person, place, and time. She appears well-developed and well-nourished. No distress.  HENT:  Head: Normocephalic and atraumatic.  Mouth/Throat: Oropharynx is clear and moist.  Eyes: EOM are normal. Pupils are equal, round, and reactive to light. No scleral icterus.  Cardiovascular: Normal rate, regular rhythm, normal heart sounds and intact distal pulses.  Exam reveals no gallop and no friction rub.   No murmur heard. Pulmonary/Chest: Effort normal and breath sounds normal. No respiratory distress.  Abdominal: Soft. Bowel sounds are normal. She exhibits no distension and no mass. There is no rebound and no guarding.  Tenderness in RUQ, positive Murphy sign. No CVA tenderness   Musculoskeletal: She exhibits no edema.  Neurological: She is alert and oriented to person, place, and time.  Skin: She is not diaphoretic.    ED Course  Procedures (including critical care time) Labs Review Labs Reviewed  CBC WITH DIFFERENTIAL - Abnormal; Notable for the following:    Hemoglobin 15.2 (*)    All other components within normal limits  COMPREHENSIVE METABOLIC PANEL  LIPASE, BLOOD  URINALYSIS, ROUTINE W REFLEX MICROSCOPIC    Imaging Review Dg Chest 2 View  08/13/2014   CLINICAL DATA:  Right rib pain.  History of COPD.  EXAM: CHEST  2 VIEW  COMPARISON:  04/20/2014  FINDINGS: Two views of the chest demonstrate clear lungs. Heart and mediastinum are within normal limits. The trachea is midline. No acute bone abnormality. No evidence for a pneumothorax. No gross abnormality to the ribs but this is not a dedicated rib examination. There is stable hyperinflation consistent with history of COPD.  IMPRESSION: No acute cardiopulmonary disease.  Hyperinflation and emphysematous changes.   Electronically Signed   By: Markus Daft M.D.   On: 08/13/2014 14:08   US Abdomen Limited  08/13/2014   CLINICAL DATA:  Right upper quadrant pain.  EXAM: US ABDOMEN LIMITED - RIGHT UPPER QUADRANT  COMPARISON:  None.  FINDINGS: Gallbladder:  Gallbladder has a normal appearance. Gallbladder wall is 1.5 mm, within normal limits. No stones or pericholecystic fluid. No sonographic Murphy's sign.  Common bile duct:  Diameter: 3.7 mm  Liver:  No focal lesion identified. Within normal limits in parenchymal echogenicity.  IMPRESSION: Normal evaluation of the right upper quadrant.   Electronically Signed   By: Shon Hale M.D.   On: 08/13/2014 11:35     EKG Interpretation None      MDM   Final diagnoses:  None   10:46AM: Patient with 1 d waxing and waning RUQ tenderness to back/flank. She is tender to palpation in RUQ w positive Murphy sign. Afebrile. She was recently treated for shingles in June and  while there were no skin lesions, these usually appear a week after pain begins. Will check CBC, CMP, lipase, UA and get RUQ ultrasound to evaluate for cholecystitis/gallstones. Treat symptoms w dilaudid 1 mg iv and zofran 4 mg iv. No EKG, CXR for now as pain is reproducible in RUQ with palpation.  1:14PM: CBC, CMP, lipase UA negative. RUQ u/s normal. Will get CXR but patient will likely have to follow-up w PCP for possible zoster and HIDA scan.  2:42PM: CT wnl. Discussed plan with patient as well as return precautions.  Kelby Aline, MD 08/13/14 [phone]

## 2015-04-07 ENCOUNTER — Ambulatory Visit: Payer: No Typology Code available for payment source | Attending: Internal Medicine

## 2015-04-09 ENCOUNTER — Emergency Department (INDEPENDENT_AMBULATORY_CARE_PROVIDER_SITE_OTHER)
Admission: EM | Admit: 2015-04-09 | Discharge: 2015-04-09 | Disposition: A | Discharge status: Home / Self Care | Payer: Self-pay | Source: Home / Self Care | Attending: Family Medicine | Admitting: Family Medicine

## 2015-04-09 ENCOUNTER — Encounter (HOSPITAL_COMMUNITY): Payer: Self-pay | Admitting: Emergency Medicine

## 2015-04-09 DIAGNOSIS — K056 Periodontal disease, unspecified: Secondary | ICD-10-CM

## 2015-04-09 DIAGNOSIS — K090 Developmental odontogenic cysts: Secondary | ICD-10-CM

## 2015-04-09 MED ORDER — CLINDAMYCIN HCL 300 MG PO CAPS: 300.0000 mg | ORAL_CAPSULE | Freq: Three times a day (TID) | ORAL | Status: DC

## 2015-04-09 NOTE — ED Notes (Signed)
Pt states that she has had lower right dental pain for months.

## 2015-04-09 NOTE — Discharge Instructions (Signed)
Take medicine as prescribed, see your dentist as soon as possible °

## 2015-04-09 NOTE — ED Provider Notes (Signed)
CSN: [id number]     Arrival date & time 04/09/15  1029 History   First MD Initiated Contact with Patient 04/09/15 1140     No chief complaint on file.  (Consider location/radiation/quality/duration/timing/severity/associated sxs/prior Treatment) Patient is a 54 y.o. female presenting with tooth pain. The history is provided by the patient.  Dental Pain Location:  Lower Lower teeth location:  27/RL cuspid Severity:  Mild Onset quality:  Gradual Duration:  3 months Progression:  Worsening Chronicity:  Chronic Context: poor dentition   Relieved by:  None tried Worsened by:  Nothing tried Ineffective treatments:  None tried Associated symptoms: gum swelling and oral lesions   Risk factors: lack of dental care and smoking     Past Medical History  Diagnosis Date  . Neuromuscular disorder 2014    RSD - nerve blocks in back for treatment   . Cancer     bladder ca   Past Surgical History  Procedure Laterality Date  . Bladder cancer  2006  . Breast surgery  "long time ago"    lumpectomy   . Cesarean section  1982   Family History  Problem Relation Age of Onset  . Heart disease Mother   . Thyroid disease Sister   . Diabetes Brother   . Epilepsy Son   . Early death Father   . Pulmonary embolism Father   . Colon cancer Neg Hx    History  Substance Use Topics  . Smoking status: Current Some Day Smoker -- 0.25 packs/day  . Smokeless tobacco: Never Used  . Alcohol Use: No   OB History    No data available     Review of Systems  Constitutional: Negative.   HENT: Positive for dental problem and mouth sores.     Allergies  Sulfa antibiotics  Home Medications   Prior to Admission medications   Medication Sig Start Date End Date Taking? Authorizing Provider  albuterol (PROVENTIL HFA;VENTOLIN HFA) 108 (90 BASE) MCG/ACT inhaler Inhale 2 puffs into the lungs every 4 (four) hours as needed for wheezing (cough, shortness of breath or wheezing.). 04/20/14   Darlyne Russian, MD   clindamycin (CLEOCIN) 300 MG capsule Take 1 capsule (300 mg total) by mouth 3 (three) times daily. 04/09/15   Billy Fischer, MD  tiotropium (SPIRIVA HANDIHALER) 18 MCG inhalation capsule Place 1 capsule (18 mcg total) into inhaler and inhale Nightly. 04/20/14   Darlyne Russian, MD   BP 142/86 mmHg  Pulse 83  Temp(Src) 99.2 F (37.3 C) (Oral)  Resp 18  SpO2 100% Physical Exam  Constitutional: She appears well-developed and well-nourished.  HENT:  Head: Normocephalic.  Right Ear: External ear normal.  Left Ear: External ear normal.  Mouth/Throat: Uvula is midline, oropharynx is clear and moist and mucous membranes are normal.    Nursing note and vitals reviewed.   ED Course  Procedures (including critical care time) Labs Review Labs Reviewed - No data to display  Imaging Review No results found.   MDM   1. Gingival cyst        Billy Fischer, MD 04/09/15 1200

## 2015-04-26 ENCOUNTER — Encounter: Payer: Self-pay | Admitting: Internal Medicine

## 2015-04-26 ENCOUNTER — Ambulatory Visit: Payer: Self-pay | Attending: Internal Medicine | Admitting: Internal Medicine

## 2015-04-26 VITALS — BP 109/74 | HR 83 | Temp 98.1°F | Resp 16 | Ht 60.0 in | Wt 97.0 lb

## 2015-04-26 DIAGNOSIS — M545 Low back pain: Secondary | ICD-10-CM

## 2015-04-26 DIAGNOSIS — K056 Periodontal disease, unspecified: Secondary | ICD-10-CM

## 2015-04-26 DIAGNOSIS — M5440 Lumbago with sciatica, unspecified side: Secondary | ICD-10-CM | POA: Insufficient documentation

## 2015-04-26 DIAGNOSIS — K090 Developmental odontogenic cysts: Secondary | ICD-10-CM | POA: Insufficient documentation

## 2015-04-26 DIAGNOSIS — G90529 Complex regional pain syndrome I of unspecified lower limb: Secondary | ICD-10-CM | POA: Insufficient documentation

## 2015-04-26 DIAGNOSIS — J449 Chronic obstructive pulmonary disease, unspecified: Secondary | ICD-10-CM | POA: Insufficient documentation

## 2015-04-26 DIAGNOSIS — T148XXA Other injury of unspecified body region, initial encounter: Secondary | ICD-10-CM

## 2015-04-26 DIAGNOSIS — F17200 Nicotine dependence, unspecified, uncomplicated: Secondary | ICD-10-CM | POA: Insufficient documentation

## 2015-04-26 DIAGNOSIS — Z720 Tobacco use: Secondary | ICD-10-CM

## 2015-04-26 DIAGNOSIS — Z8551 Personal history of malignant neoplasm of bladder: Secondary | ICD-10-CM | POA: Insufficient documentation

## 2015-04-26 DIAGNOSIS — T148 Other injury of unspecified body region: Secondary | ICD-10-CM

## 2015-04-26 LAB — BASIC METABOLIC PANEL
BUN: 10 mg/dL (ref 6–23)
CO2: 24 mEq/L (ref 19–32)
Calcium: 9.4 mg/dL (ref 8.4–10.5)
Chloride: 103 mEq/L (ref 96–112)
Creat: 0.68 mg/dL (ref 0.50–1.10)
Glucose, Bld: 90 mg/dL (ref 70–99)
Potassium: 4.2 mEq/L (ref 3.5–5.3)
Sodium: 138 mEq/L (ref 135–145)

## 2015-04-26 MED ORDER — ALBUTEROL SULFATE HFA 108 (90 BASE) MCG/ACT IN AERS: 2.0000 | INHALATION_SPRAY | RESPIRATORY_TRACT | Status: DC | PRN

## 2015-04-26 MED ORDER — GABAPENTIN 300 MG PO CAPS: 300.0000 mg | ORAL_CAPSULE | Freq: Two times a day (BID) | ORAL | Status: DC

## 2015-04-26 MED ORDER — GABAPENTIN 100 MG PO CAPS: 100.0000 mg | ORAL_CAPSULE | Freq: Three times a day (TID) | ORAL | Status: DC

## 2015-04-26 MED ORDER — TIOTROPIUM BROMIDE MONOHYDRATE 18 MCG IN CAPS: 18.0000 ug | ORAL_CAPSULE | Freq: Every evening | RESPIRATORY_TRACT | Status: DC

## 2015-04-26 MED ORDER — TRAMADOL HCL 50 MG PO TABS: 50.0000 mg | ORAL_TABLET | Freq: Two times a day (BID) | ORAL | Status: DC | PRN

## 2015-04-26 NOTE — Progress Notes (Signed)
Patient ID: Candace Middleton, female   DOB: January 26, 1961, 54 y.o.   MRN: [id number]  NGE:[id number]  MWN:[id number]  DOB - 09-Sep-1961  CC:  Chief Complaint  Patient presents with  . Establish Care       HPI: Candace Middleton is a 54 y.o. female here today to establish medical care. Patient has a past medical history of COPD and bladder cancer (dx 2006). She reports that she was told to have her cystoscopy yearly but she lost her insurance and has not had one in 2 years. She was on BCG treatments. Was told she was in remission since 2008. She was a previous patient of [facility] and would like to return for care.  She has been without her inhalers for several months. She is a current smoker with less than 1 ppd. She reports a knee fracture several years ago that resulted in nerve damage. She reports that she has been seen by a orthopedics in the past for injections in her lower back pain and knee. She would like additional treatment.  Patient was seen 2 weeks ago by the Urgent Care for a gingival cyst. She was given clindamycin 300 mg TID for the cyst. She has not been to a dentist in several years due to being uninsured before the cyst. She has been to see a dentist but has been referred to a oral surgeon on Monday May 9th. The cyst is no longer painful. She does feel like something stuck in her throat.   Patient has No headache, No chest pain, No abdominal pain - No Nausea, No new weakness tingling or numbness, No Cough - SOB.  Allergies  Allergen Reactions  . Sulfa Antibiotics Anaphylaxis   Past Medical History  Diagnosis Date  . Neuromuscular disorder 2014    RSD - nerve blocks in back for treatment   . Cancer     bladder ca   Current Outpatient Prescriptions on File Prior to Visit  Medication Sig Dispense Refill  . albuterol (PROVENTIL HFA;VENTOLIN HFA) 108 (90 BASE) MCG/ACT inhaler Inhale 2 puffs into the lungs every 4 (four) hours as needed for wheezing (cough,  shortness of breath or wheezing.). (Patient not taking: Reported on 04/26/2015) 1 Inhaler 1  . clindamycin (CLEOCIN) 300 MG capsule Take 1 capsule (300 mg total) by mouth 3 (three) times daily. (Patient not taking: Reported on 04/26/2015) 21 capsule 0  . tiotropium (SPIRIVA HANDIHALER) 18 MCG inhalation capsule Place 1 capsule (18 mcg total) into inhaler and inhale Nightly. (Patient not taking: Reported on 04/26/2015) 30 capsule 12   No current facility-administered medications on file prior to visit.   Family History  Problem Relation Age of Onset  . Heart disease Mother   . Thyroid disease Sister   . Diabetes Brother   . Epilepsy Son   . Early death Father   . Pulmonary embolism Father   . Colon cancer Neg Hx    History   Social History  . Marital Status: Married    Spouse Name: N/A  . Number of Children: N/A  . Years of Education: N/A   Occupational History  . 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 Smoking status: Current Some Day Smoker -- 0.25 packs/day  . Smokeless tobacco: Never Used  . Alcohol Use: No  . Drug Use: No  . Sexual Activity: Yes   Other Topics Concern  . Not on file  Social History Narrative   Husband is currently undergoing chemotherapy for lung cancer. Son has recently been diagnosed with optic neuritis and has multi-handicaps.     Review of Systems: See HPI    Objective:   Filed Vitals:   04/26/15 1102  BP: 109/74  Pulse: 83  Temp: 98.1 F (36.7 C)  Resp: 16    Physical Exam  Constitutional: She is oriented to person, place, and time.  HENT:  Mouth/Throat:    Very large tender cyst  Cardiovascular: Normal rate, regular rhythm and normal heart sounds.   Pulmonary/Chest: Effort normal and breath sounds normal.  Musculoskeletal: Normal range of motion.  Neurological: She is alert and oriented to person, place, and time.  Skin: Skin is warm and dry.     Lab Results    Component Value Date   WBC 7.6 08/13/2014   HGB 15.2* 08/13/2014   HCT 43.6 08/13/2014   MCV 91.0 08/13/2014   PLT 250 08/13/2014   Lab Results  Component Value Date   CREATININE 0.68 08/13/2014   BUN 11 08/13/2014   NA 140 08/13/2014   K 4.2 08/13/2014   CL 103 08/13/2014   CO2 25 08/13/2014    No results found for: HGBA1C Lipid Panel     Component Value Date/Time   CHOL 237* 06/24/2013 1036   TRIG 74 06/24/2013 1036   HDL 65 06/24/2013 1036   CHOLHDL 3.6 06/24/2013 1036   VLDL 15 06/24/2013 1036   LDLCALC 157* 06/24/2013 1036       Assessment and plan:   Candace Middleton was seen today for establish care.  Diagnoses and all orders for this visit:  History of bladder cancer Orders: -     Ambulatory referral to Urology  Chronic obstructive pulmonary disease, unspecified COPD, unspecified chronic bronchitis type Orders: -     tiotropium (SPIRIVA HANDIHALER) 18 MCG inhalation capsule; Place 1 capsule (18 mcg total) into inhaler and inhale Nightly. -     albuterol (PROVENTIL HFA;VENTOLIN HFA) 108 (90 BASE) MCG/ACT inhaler; Inhale 2 puffs into the lungs every 4 (four) hours as needed for wheezing (cough, shortness of breath or wheezing.). Refilled inhalers, advised patient to quit smoking now  Gingival cyst Orders: -     traMADol (ULTRAM) 50 MG tablet; Take 1 tablet (50 mg total) by mouth every 12 (twelve) hours as needed. -     CBC with Differential -     Basic Metabolic Panel Will give short dose of pain medication to hold patient until her appt next week.  Midline low back pain, with sciatica presence unspecified Orders: -     Ambulatory referral to Orthopedic Surgery  Nerve damage Orders: -   Begin  gabapentin (NEURONTIN) 300 MG capsule; Take 1 capsule (300 mg total) by mouth 2 (two) times daily.  Smoker Smoking cessation discussed for 3 minutes, patient is not willing to quit at this time. Will continue to assess on each visit. Discussed increased risk for  diseases such as cancer, heart disease, and stroke.     Return in about 6 months (around 10/27/2015).     Holland Commons, NP-C [facility] [phone] 04/26/2015, 11:17 AM

## 2015-04-26 NOTE — Patient Instructions (Signed)
Smoking Cessation Quitting smoking is important to your health and has many advantages. However, it is not always easy to quit since nicotine is a very addictive drug. Oftentimes, people try 3 times or more before being able to quit. This document explains the best ways for you to prepare to quit smoking. Quitting takes hard work and a lot of effort, but you can do it. ADVANTAGES OF QUITTING SMOKING  You will live longer, feel better, and live better.  Your body will feel the impact of quitting smoking almost immediately.  Within 20 minutes, blood pressure decreases. Your pulse returns to its normal level.  After 8 hours, carbon monoxide levels in the blood return to normal. Your oxygen level increases.  After 24 hours, the chance of having a heart attack starts to decrease. Your breath, hair, and body stop smelling like smoke.  After 48 hours, damaged nerve endings begin to recover. Your sense of taste and smell improve.  After 72 hours, the body is virtually free of nicotine. Your bronchial tubes relax and breathing becomes easier.  After 2 to 12 weeks, lungs can hold more air. Exercise becomes easier and circulation improves.  The risk of having a heart attack, stroke, cancer, or lung disease is greatly reduced.  After 1 year, the risk of coronary heart disease is cut in half.  After 5 years, the risk of stroke falls to the same as a nonsmoker.  After 10 years, the risk of lung cancer is cut in half and the risk of other cancers decreases significantly.  After 15 years, the risk of coronary heart disease drops, usually to the level of a nonsmoker.  If you are pregnant, quitting smoking will improve your chances of having a healthy baby.  The people you live with, especially any children, will be healthier.  You will have extra money to spend on things other than cigarettes. QUESTIONS TO THINK ABOUT BEFORE ATTEMPTING TO QUIT You may want to talk about your answers with your  health care provider.  Why do you want to quit?  If you tried to quit in the past, what helped and what did not?  What will be the most difficult situations for you after you quit? How will you plan to handle them?  Who can help you through the tough times? Your family? Friends? A health care provider?  What pleasures do you get from smoking? What ways can you still get pleasure if you quit? Here are some questions to ask your health care provider:  How can you help me to be successful at quitting?  What medicine do you think would be best for me and how should I take it?  What should I do if I need more help?  What is smoking withdrawal like? How can I get information on withdrawal? GET READY  Set a quit date.  Change your environment by getting rid of all cigarettes, ashtrays, matches, and lighters in your home, car, or work. Do not let people smoke in your home.  Review your past attempts to quit. Think about what worked and what did not. GET SUPPORT AND ENCOURAGEMENT You have a better chance of being successful if you have help. You can get support in many ways.  Tell your family, friends, and coworkers that you are going to quit and need their support. Ask them not to smoke around you.  Get individual, group, or telephone counseling and support. Programs are available at local hospitals and health centers. Call   your local health department for information about programs in your area.  Spiritual beliefs and practices may help some smokers quit.  Download a "quit meter" on your computer to keep track of quit statistics, such as how long you have gone without smoking, cigarettes not smoked, and money saved.  Get a self-help book about quitting smoking and staying off tobacco. LEARN NEW SKILLS AND BEHAVIORS  Distract yourself from urges to smoke. Talk to someone, go for a walk, or occupy your time with a task.  Change your normal routine. Take a different route to work.  Drink tea instead of coffee. Eat breakfast in a different place.  Reduce your stress. Take a hot bath, exercise, or read a book.  Plan something enjoyable to do every day. Reward yourself for not smoking.  Explore interactive web-based programs that specialize in helping you quit. GET MEDICINE AND USE IT CORRECTLY Medicines can help you stop smoking and decrease the urge to smoke. Combining medicine with the above behavioral methods and support can greatly increase your chances of successfully quitting smoking.  Nicotine replacement therapy helps deliver nicotine to your body without the negative effects and risks of smoking. Nicotine replacement therapy includes nicotine gum, lozenges, inhalers, nasal sprays, and skin patches. Some may be available over-the-counter and others require a prescription.  Antidepressant medicine helps people abstain from smoking, but how this works is unknown. This medicine is available by prescription.  Nicotinic receptor partial agonist medicine simulates the effect of nicotine in your brain. This medicine is available by prescription. Ask your health care provider for advice about which medicines to use and how to use them based on your health history. Your health care provider will tell you what side effects to look out for if you choose to be on a medicine or therapy. Carefully read the information on the package. Do not use any other product containing nicotine while using a nicotine replacement product.  RELAPSE OR DIFFICULT SITUATIONS Most relapses occur within the first 3 months after quitting. Do not be discouraged if you start smoking again. Remember, most people try several times before finally quitting. You may have symptoms of withdrawal because your body is used to nicotine. You may crave cigarettes, be irritable, feel very hungry, cough often, get headaches, or have difficulty concentrating. The withdrawal symptoms are only temporary. They are strongest  when you first quit, but they will go away within 10-14 days. To reduce the chances of relapse, try to:  Avoid drinking alcohol. Drinking lowers your chances of successfully quitting.  Reduce the amount of caffeine you consume. Once you quit smoking, the amount of caffeine in your body increases and can give you symptoms, such as a rapid heartbeat, sweating, and anxiety.  Avoid smokers because they can make you want to smoke.  Do not let weight gain distract you. Many smokers will gain weight when they quit, usually less than 10 pounds. Eat a healthy diet and stay active. You can always lose the weight gained after you quit.  Find ways to improve your mood other than smoking. FOR MORE INFORMATION  www.smokefree.gov  Document Released: 12/05/2001 Document Revised: 04/27/2014 Document Reviewed: 03/21/2012 [employer] Patient Information 2015 [employer]. This information is not intended to replace advice given to you by your health care provider. Make sure you discuss any questions you have with your health care provider.  

## 2015-04-26 NOTE — Progress Notes (Signed)
Pt is here to establish care. Pt has been told that she has HTN and COPD. Pt has bladder cancer and diagnosed in 2006.

## 2015-04-27 LAB — CBC WITH DIFFERENTIAL/PLATELET
Basophils Absolute: 0 10*3/uL (ref 0.0–0.1)
Basophils Relative: 0 % (ref 0–1)
Eosinophils Absolute: 0.1 10*3/uL (ref 0.0–0.7)
Eosinophils Relative: 1 % (ref 0–5)
HCT: 44 % (ref 36.0–46.0)
Hemoglobin: 14.8 g/dL (ref 12.0–15.0)
Lymphocytes Relative: 35 % (ref 12–46)
Lymphs Abs: 3.1 10*3/uL (ref 0.7–4.0)
MCH: 31 pg (ref 26.0–34.0)
MCHC: 33.6 g/dL (ref 30.0–36.0)
MCV: 92.1 fL (ref 78.0–100.0)
MPV: 9.5 fL (ref 8.6–12.4)
Monocytes Absolute: 0.9 10*3/uL (ref 0.1–1.0)
Monocytes Relative: 10 % (ref 3–12)
Neutro Abs: 4.8 10*3/uL (ref 1.7–7.7)
Neutrophils Relative %: 54 % (ref 43–77)
Platelets: 334 10*3/uL (ref 150–400)
RBC: 4.78 MIL/uL (ref 3.87–5.11)
RDW: 13.3 % (ref 11.5–15.5)
WBC: 8.8 10*3/uL (ref 4.0–10.5)

## 2015-04-28 ENCOUNTER — Encounter: Payer: Self-pay | Admitting: *Deleted

## 2015-04-28 NOTE — Progress Notes (Signed)
Patient ID: Candace Middleton, female   DOB: 09-Sep-1961, 54 y.o.   MRN: [id number]  Patient called in to say she started taking the Gabapentin and has developed a rash and swelling on her have and stomach.  She is not reporting any trouble breathing.  She is asking to take a different medication and uses our pharmacy and has the [facility] discount.  Please advise

## 2015-04-29 ENCOUNTER — Ambulatory Visit (HOSPITAL_BASED_OUTPATIENT_CLINIC_OR_DEPARTMENT_OTHER): Payer: Self-pay | Admitting: Family Medicine

## 2015-04-29 ENCOUNTER — Ambulatory Visit: Payer: Self-pay | Attending: Internal Medicine

## 2015-04-29 VITALS — BP 136/83 | HR 63 | Temp 97.6°F | Wt 96.8 lb

## 2015-04-29 DIAGNOSIS — R21 Rash and other nonspecific skin eruption: Secondary | ICD-10-CM

## 2015-04-29 NOTE — Progress Notes (Signed)
Subjective:     Patient ID: Candace Middleton, female   DOB: [birth year], 54 y.o.   MRN: [id number]  HPI   Patient presents with possible allergic reaction to medication. Last week she was restarted on Spiriva and was started on gabapenin for the first time for regional pain syndrome. She took her first dose of each and awaken the next morning with a fine pink rash and itching. It resolved to some degree during the day and she took her second dose that night. The rash was a little worse on the next morning and she noticed a slight swelling of her face. She has not taken gabepentin since then and the rash is resolving. There has not been significant itching.   Review of Systems   GEN:  Denies fever, chills, faintness HENT:Denies swelling of lips,throat, denies any problems with swallowing RESP: Denies SOB, coughing CARD: Denies chest pain or palpitation. SKIN: Admits to a fine papular rash with increased erythema    Objective:   Physical Exam   GEN: well-nourshided, well-developed, in no distress SKIN: There is a wide spread fine papular rash with minimal erythema. HENT: Throat is clear, no swelling or erythema RESP: Lungs are clear to auscultation CARD: HS are regular w/o m,r, g     Assessment:     Probable allergic reaction to gabapentin    Plan:     Stop gabapentin for now. She may retry in about a month to see if rash recurs. If we determine this is an allergic reaction to gabapentin, we can see about financial assistance for Lyrica. I have provided a handout on allergic reactions and anaph. She understands what would prompt her to go to ED.

## 2015-04-29 NOTE — Patient Instructions (Signed)
L.  Your blood work was normal 2.  Follow-up if rash worsens 3.  If any shortness of breath, swelling of throat or  Tongue or other worisome symptoms ,, go to ED 4.  Stop Gabapentin. May try again in about 3-4 weeks to see if happens again 5.  If does happen again, we can try to get financial assistance for Lyrica;  6. Follow-up with primary care provider as previously instructed.  Anaphylactic Reaction An anaphylactic reaction is a sudden, severe allergic reaction that involves the whole body. It can be life threatening. A hospital stay is often required. People with asthma, eczema, or hay fever are slightly more likely to have an anaphylactic reaction. CAUSES  An anaphylactic reaction may be caused by anything to which you are allergic. After being exposed to the allergic substance, your immune system becomes sensitized to it. When you are exposed to that allergic substance again, an allergic reaction can occur. Common causes of an anaphylactic reaction include:  Medicines.  Foods, especially peanuts, wheat, shellfish, milk, and eggs.  Insect bites or stings.  Blood products.  Chemicals, such as dyes, latex, and contrast material used for imaging tests. SYMPTOMS  When an allergic reaction occurs, the body releases histamine and other substances. These substances cause symptoms such as tightening of the airway. Symptoms often develop within seconds or minutes of exposure. Symptoms may include:  Skin rash or hives.  Itching.  Chest tightness.  Swelling of the eyes, tongue, or lips.  Trouble breathing or swallowing.  Lightheadedness or fainting.  Anxiety or confusion.  Stomach pains, vomiting, or diarrhea.  Nasal congestion.  A fast or irregular heartbeat (palpitations). DIAGNOSIS  Diagnosis is based on your history of recent exposure to allergic substances, your symptoms, and a physical exam. Your caregiver may also perform blood or urine tests to confirm the  diagnosis. TREATMENT  Epinephrine medicine is the main treatment for an anaphylactic reaction. Other medicines that may be used for treatment include antihistamines, steroids, and albuterol. In severe cases, fluids and medicine to support blood pressure may be given through an intravenous line (IV). Even if you improve after treatment, you need to be observed to make sure your condition does not get worse. This may require a stay in the hospital. [place] a medical alert bracelet or necklace stating your allergy.  You and your family must learn how to use an anaphylaxis kit or give an epinephrine injection to temporarily treat an emergency allergic reaction. Always carry your epinephrine injection or anaphylaxis kit with you. This can be lifesaving if you have a severe reaction.  Do not drive or perform tasks after treatment until the medicines used to treat your reaction have worn off, or until your caregiver says it is okay.  If you have hives or a rash:  Take medicines as directed by your caregiver.  You may use an over-the-counter antihistamine (diphenhydramine) as needed.  Apply cold compresses to the skin or take baths in cool water. Avoid hot baths or showers. SEEK MEDICAL CARE IF:   You develop symptoms of an allergic reaction to a new substance. Symptoms may start right away or minutes later.  You develop a rash, hives, or itching.  You develop new symptoms. SEEK IMMEDIATE MEDICAL CARE IF:   You have swelling of the mouth, difficulty breathing, or wheezing.  You have a tight feeling in your chest or throat.  You develop hives, swelling, or itching all over your body.  You  develop severe vomiting or diarrhea.  You feel faint or pass out. This is an emergency. Use your epinephrine injection or anaphylaxis kit as you have been instructed. Call your local emergency services (911 in [place] Even if you improve after the injection, you need to be examined at  a hospital emergency department. MAKE SURE YOU:   Understand these instructions.  Will watch your condition.  Will get help right away if you are not doing well or get worse. Document Released: 12/11/2005 Document Revised: 12/16/2013 Document Reviewed: 03/13/2012 [facility] Patient Information 2015 [place], [place]. This information is not intended to replace advice given to you by your health care provider. Make sure you discuss any questions you have with your health care provider.

## 2015-04-29 NOTE — Progress Notes (Signed)
I do not have any other option than gabapentin that is affordable without insurance. If it is causing her problems stop taking asap. She may wait until her ortho appt and see if they can find her something different.

## 2015-04-30 ENCOUNTER — Ambulatory Visit: Payer: No Typology Code available for payment source

## 2015-05-03 ENCOUNTER — Telehealth: Payer: Self-pay | Admitting: *Deleted

## 2015-05-03 NOTE — Telephone Encounter (Signed)
Pt is aware of her lab results.  

## 2015-05-03 NOTE — Telephone Encounter (Signed)
-----   Message from Lance Bosch, NP sent at 04/29/2015  7:39 AM EDT ----- Labs are within normal limits

## 2015-05-06 NOTE — Progress Notes (Signed)
Spoke to patient who said she has not taken the gabapentin since she first called in and her "reaction" symptoms have gone away.  She is willing to wait to speak to the orthopedic MD about further options.

## 2015-05-07 ENCOUNTER — Telehealth: Payer: Self-pay | Admitting: Internal Medicine

## 2015-05-07 NOTE — Telephone Encounter (Signed)
Patient called [facility] this morning and they said they haven't received a referral from [place]. Please follow up with patient about the details.

## 2015-05-07 NOTE — Telephone Encounter (Signed)
The referral was fax 04/26/15 I fax it again now  Thanks

## 2015-05-10 ENCOUNTER — Telehealth: Payer: Self-pay | Admitting: Internal Medicine

## 2015-05-10 NOTE — Telephone Encounter (Signed)
Patient called stating [facility] ph. # [phone] states that they need referral through [facility] in order for her to be seen. Please f/u

## 2015-05-10 NOTE — Telephone Encounter (Signed)
I spoke to patient and I told her that she has the [employer] discount and she would have to go [place]  Patient is aware of that we just waiting for an appointment from [facility]

## 2015-05-18 ENCOUNTER — Other Ambulatory Visit (HOSPITAL_COMMUNITY)
Admission: RE | Admit: 2015-05-18 | Discharge: 2015-05-18 | Disposition: A | Discharge status: Home / Self Care | Payer: PRIVATE HEALTH INSURANCE | Source: Ambulatory Visit | Attending: Oral and Maxillofacial Surgery | Admitting: Oral and Maxillofacial Surgery

## 2015-05-18 ENCOUNTER — Other Ambulatory Visit: Payer: Self-pay | Admitting: Oral and Maxillofacial Surgery

## 2015-05-18 DIAGNOSIS — R220 Localized swelling, mass and lump, head: Secondary | ICD-10-CM | POA: Insufficient documentation

## 2015-05-19 ENCOUNTER — Other Ambulatory Visit: Payer: Self-pay | Admitting: Internal Medicine

## 2015-05-19 DIAGNOSIS — J449 Chronic obstructive pulmonary disease, unspecified: Secondary | ICD-10-CM

## 2015-05-19 MED ORDER — ALBUTEROL SULFATE HFA 108 (90 BASE) MCG/ACT IN AERS: 2.0000 | INHALATION_SPRAY | RESPIRATORY_TRACT | Status: DC | PRN

## 2015-05-19 MED ORDER — TIOTROPIUM BROMIDE MONOHYDRATE 18 MCG IN CAPS: 18.0000 ug | ORAL_CAPSULE | Freq: Every evening | RESPIRATORY_TRACT | Status: DC

## 2015-06-02 ENCOUNTER — Other Ambulatory Visit (HOSPITAL_COMMUNITY): Payer: Self-pay | Admitting: Physical Medicine and Rehabilitation

## 2015-06-02 DIAGNOSIS — M545 Low back pain: Secondary | ICD-10-CM

## 2015-06-16 ENCOUNTER — Ambulatory Visit (HOSPITAL_COMMUNITY)
Admission: RE | Admit: 2015-06-16 | Discharge: 2015-06-16 | Disposition: A | Discharge status: Home / Self Care | Payer: PRIVATE HEALTH INSURANCE | Source: Ambulatory Visit | Attending: Physical Medicine and Rehabilitation | Admitting: Physical Medicine and Rehabilitation

## 2015-06-16 DIAGNOSIS — M5136 Other intervertebral disc degeneration, lumbar region: Secondary | ICD-10-CM | POA: Insufficient documentation

## 2015-06-16 DIAGNOSIS — M545 Low back pain: Secondary | ICD-10-CM

## 2015-06-21 ENCOUNTER — Encounter (HOSPITAL_COMMUNITY): Payer: Self-pay | Admitting: Emergency Medicine

## 2015-06-21 ENCOUNTER — Emergency Department (INDEPENDENT_AMBULATORY_CARE_PROVIDER_SITE_OTHER)
Admission: EM | Admit: 2015-06-21 | Discharge: 2015-06-21 | Disposition: A | Discharge status: Home / Self Care | Payer: Self-pay | Source: Home / Self Care | Attending: Family Medicine | Admitting: Family Medicine

## 2015-06-21 DIAGNOSIS — N390 Urinary tract infection, site not specified: Secondary | ICD-10-CM

## 2015-06-21 LAB — POCT URINALYSIS DIP (DEVICE)
Bilirubin Urine: NEGATIVE
Glucose, UA: NEGATIVE mg/dL
Hgb urine dipstick: NEGATIVE
Ketones, ur: NEGATIVE mg/dL
Nitrite: NEGATIVE
Protein, ur: NEGATIVE mg/dL
Specific Gravity, Urine: 1.02 (ref 1.005–1.030)
Urobilinogen, UA: 0.2 mg/dL (ref 0.0–1.0)
pH: 5.5 (ref 5.0–8.0)

## 2015-06-21 MED ORDER — CEPHALEXIN 250 MG PO CAPS: 250.0000 mg | ORAL_CAPSULE | Freq: Four times a day (QID) | ORAL | Status: DC

## 2015-06-21 NOTE — ED Notes (Signed)
Pt comes in with UTI sx's- urgency, frequency with burn x 3 dys  OTC test positive for UTI Hx recurrent infection in the past, Bladder CA Denies fever,chills

## 2015-06-21 NOTE — ED Provider Notes (Signed)
CSN: [id number]     Arrival date & time 06/21/15  1302 History   First MD Initiated Contact with Patient 06/21/15 1321     Chief Complaint  Patient presents with  . Urinary Tract Infection   (Consider location/radiation/quality/duration/timing/severity/associated sxs/prior Treatment) HPI Comments: 54 year old female complaining of urinary urgency, frequency and dysuria for 3 days.  Patient is a 54 y.o. female presenting with urinary tract infection.  Urinary Tract Infection Associated symptoms: no fever, no flank pain and no vaginal discharge     Past Medical History  Diagnosis Date  . Neuromuscular disorder 2014    RSD - nerve blocks in back for treatment   . Cancer     bladder ca   Past Surgical History  Procedure Laterality Date  . Bladder cancer  2006  . Breast surgery  "long time ago"    lumpectomy   . Cesarean section  1982   Family History  Problem Relation Age of Onset  . Heart disease Mother   . Thyroid disease Sister   . Diabetes Brother   . Epilepsy Son   . Early death Father   . Pulmonary embolism Father   . Colon cancer Neg Hx    History  Substance Use Topics  . Smoking status: Current Some Day Smoker -- 0.25 packs/day  . Smokeless tobacco: Never Used  . Alcohol Use: No   OB History    No data available     Review of Systems  Constitutional: Negative.  Negative for fever.  HENT: Negative.   Respiratory: Negative for cough and shortness of breath.   Cardiovascular: Negative for chest pain.  Gastrointestinal: Negative.   Genitourinary: Positive for dysuria, urgency and frequency. Negative for flank pain, vaginal bleeding, vaginal discharge, genital sores, menstrual problem and pelvic pain.  Neurological: Negative.     Allergies  Sulfa antibiotics  Home Medications   Prior to Admission medications   Medication Sig Start Date End Date Taking? Authorizing Provider  albuterol (PROVENTIL HFA;VENTOLIN HFA) 108 (90 BASE) MCG/ACT inhaler Inhale 2  puffs into the lungs every 4 (four) hours as needed for wheezing (cough, shortness of breath or wheezing.). 05/19/15   Tresa Garter, MD  cephALEXin (KEFLEX) 250 MG capsule Take 1 capsule (250 mg total) by mouth 4 (four) times daily. 06/21/15   Janne Napoleon, NP  tiotropium (SPIRIVA HANDIHALER) 18 MCG inhalation capsule Place 1 capsule (18 mcg total) into inhaler and inhale Nightly. 05/19/15   Tresa Garter, MD  traMADol (ULTRAM) 50 MG tablet Take 1 tablet (50 mg total) by mouth every 12 (twelve) hours as needed. 04/26/15   Lance Bosch, NP   BP 116/82 mmHg  Pulse 86  Temp(Src) 98.1 F (36.7 C) (Oral)  Resp 14  SpO2 96% Physical Exam  Constitutional: She appears well-developed and well-nourished. No distress.  Neck: Normal range of motion. Neck supple.  Cardiovascular: Normal rate.   Pulmonary/Chest: Effort normal. No respiratory distress.  Musculoskeletal: She exhibits no edema.  Neurological: She is alert. She exhibits normal muscle tone.  Skin: Skin is warm and dry. No rash noted.  Psychiatric: She has a normal mood and affect.  Nursing note and vitals reviewed.   ED Course  Procedures (including critical care time) Labs Review Labs Reviewed  POCT URINALYSIS DIP (DEVICE) - Abnormal; Notable for the following:    Leukocytes, UA SMALL (*)    All other components within normal limits   Results for orders placed or performed during the hospital encounter of  06/21/15  POCT urinalysis dip (device)  Result Value Ref Range   Glucose, UA NEGATIVE NEGATIVE mg/dL   Bilirubin Urine NEGATIVE NEGATIVE   Ketones, ur NEGATIVE NEGATIVE mg/dL   Specific Gravity, Urine 1.020 1.005 - 1.030   Hgb urine dipstick NEGATIVE NEGATIVE   pH 5.5 5.0 - 8.0   Protein, ur NEGATIVE NEGATIVE mg/dL   Urobilinogen, UA 0.2 0.0 - 1.0 mg/dL   Nitrite NEGATIVE NEGATIVE   Leukocytes, UA SMALL (A) NEGATIVE     Imaging Review No results found.   MDM   1. UTI (lower urinary tract infection)     Keflex as dir AZO Lots of water Keep appt with urologist    Janne Napoleon, NP 06/21/15 1406

## 2015-06-21 NOTE — Discharge Instructions (Signed)

## 2015-06-29 ENCOUNTER — Ambulatory Visit (INDEPENDENT_AMBULATORY_CARE_PROVIDER_SITE_OTHER): Payer: Self-pay | Admitting: Urology

## 2015-06-29 DIAGNOSIS — R350 Frequency of micturition: Secondary | ICD-10-CM

## 2015-06-29 DIAGNOSIS — N3010 Interstitial cystitis (chronic) without hematuria: Secondary | ICD-10-CM

## 2015-06-29 DIAGNOSIS — C679 Malignant neoplasm of bladder, unspecified: Secondary | ICD-10-CM

## 2015-07-06 ENCOUNTER — Ambulatory Visit: Payer: PRIVATE HEALTH INSURANCE | Attending: Internal Medicine

## 2015-07-12 ENCOUNTER — Ambulatory Visit: Payer: Self-pay | Attending: Internal Medicine | Admitting: Internal Medicine

## 2015-07-12 ENCOUNTER — Encounter: Payer: Self-pay | Admitting: Internal Medicine

## 2015-07-12 VITALS — BP 107/71 | HR 76 | Temp 97.6°F | Resp 18 | Ht 60.0 in | Wt 96.2 lb

## 2015-07-12 DIAGNOSIS — F17200 Nicotine dependence, unspecified, uncomplicated: Secondary | ICD-10-CM

## 2015-07-12 DIAGNOSIS — L821 Other seborrheic keratosis: Secondary | ICD-10-CM | POA: Insufficient documentation

## 2015-07-12 DIAGNOSIS — Z124 Encounter for screening for malignant neoplasm of cervix: Secondary | ICD-10-CM | POA: Insufficient documentation

## 2015-07-12 DIAGNOSIS — Z720 Tobacco use: Secondary | ICD-10-CM | POA: Insufficient documentation

## 2015-07-12 LAB — LIPID PANEL
Cholesterol: 237 mg/dL — ABNORMAL HIGH (ref 0–200)
HDL: 65 mg/dL (ref >=46)
LDL Cholesterol: 160 mg/dL — ABNORMAL HIGH (ref 0–99)
Total CHOL/HDL Ratio: 3.6 Ratio
Triglycerides: 61 mg/dL (ref <150)
VLDL: 12 mg/dL (ref 0–40)

## 2015-07-12 LAB — TSH: TSH: 1.778 u[IU]/mL (ref 0.350–4.500)

## 2015-07-12 NOTE — Progress Notes (Signed)
Patient here for annual physical/pap smear.  Patient states she has been feeling really tired recently.  Patient reports having nerve pain in legs, but she is currently being treated for that.  Patient wants to have aging spot looked at on right anterior forearm that has been flaky/scaly/itchy for the past few months.  Patient reports allergy to Gabapentin that caused her facial swelling. Patient reports taking Topamax, Zanaflex, and Oxybutynin.

## 2015-07-12 NOTE — Progress Notes (Signed)
Patient ID: Candace Middleton, female   DOB: 07/01/1961, 54 y.o.   MRN: [id number]  CC: pap smear  HPI: Candace Middleton is a 54 y.o. female here today for a follow up visit.  Patient has past medical history of bladder cancer (resolved). She states that her last mammogram was over 2 years ago which was not normal. She was due for a repeat evaluation of the right breast 6 months later but reports that she was unable because she lost her insurance. Her last pap smear was 2 years ago. She denies dysuria, vaginal discharge, itch, odor, or lesions.   Leg pain is treated by [facility]. She is scheduled to have a nerve block tomorrow.   Patient has No headache, No chest pain, No abdominal pain - No Nausea, No new weakness tingling or numbness, No Cough - SOB.  Allergies  Allergen Reactions  . Sulfa Antibiotics Anaphylaxis  . Gabapentin Swelling    Patient reports facial swelling   Past Medical History  Diagnosis Date  . Neuromuscular disorder 2014    RSD - nerve blocks in back for treatment   . Cancer     bladder ca   Current Outpatient Prescriptions on File Prior to Visit  Medication Sig Dispense Refill  . albuterol (PROVENTIL HFA;VENTOLIN HFA) 108 (90 BASE) MCG/ACT inhaler Inhale 2 puffs into the lungs every 4 (four) hours as needed for wheezing (cough, shortness of breath or wheezing.). 3 Inhaler 3  . tiotropium (SPIRIVA HANDIHALER) 18 MCG inhalation capsule Place 1 capsule (18 mcg total) into inhaler and inhale Nightly. 90 capsule 3  . cephALEXin (KEFLEX) 250 MG capsule Take 1 capsule (250 mg total) by mouth 4 (four) times daily. (Patient not taking: Reported on 07/12/2015) 28 capsule 0  . traMADol (ULTRAM) 50 MG tablet Take 1 tablet (50 mg total) by mouth every 12 (twelve) hours as needed. (Patient not taking: Reported on 07/12/2015) 30 tablet 0  . [DISCONTINUED] gabapentin (NEURONTIN) 300 MG capsule Take 1 capsule (300 mg total) by mouth 2 (two) times daily. (Patient not  taking: Reported on 04/29/2015) 60 capsule 2   No current facility-administered medications on file prior to visit.   Family History  Problem Relation Age of Onset  . Heart disease Mother   . Thyroid disease Sister   . Diabetes Brother   . Epilepsy Son   . Early death Father   . Pulmonary embolism Father   . Colon cancer Neg Hx    History   Social History  . Marital Status: Married    Spouse Name: N/A  . Number of Children: N/A  . Years of Education: N/A   Occupational History  . 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 Smoking status: Current Some Day Smoker -- 0.25 packs/day  . Smokeless tobacco: Never Used  . Alcohol Use: No  . Drug Use: No  . Sexual Activity: Yes   Other Topics Concern  . Not on file   Social History Narrative   Husband is currently undergoing chemotherapy for lung cancer. Son has recently been diagnosed with optic neuritis and has multi-handicaps.     Review of Systems: See HPI     Objective:   Filed Vitals:   07/12/15 1413  BP: 107/71  Pulse: 76  Temp: 97.6 F (36.4 C)  Resp: 18    Physical Exam  Pulmonary/Chest: Right breast exhibits no inverted nipple and no mass.  Left breast exhibits no inverted nipple and no mass.  Genitourinary: Vagina normal and uterus normal. No breast tenderness or discharge. Cervix exhibits no motion tenderness, no discharge and no friability. Right adnexum displays no tenderness. Left adnexum displays no tenderness.  Lymphadenopathy:       Right: No inguinal adenopathy present.       Left: No inguinal adenopathy present.    Lab Results  Component Value Date   WBC 8.8 04/26/2015   HGB 14.8 04/26/2015   HCT 44.0 04/26/2015   MCV 92.1 04/26/2015   PLT 334 04/26/2015   Lab Results  Component Value Date   CREATININE 0.68 04/26/2015   BUN 10 04/26/2015   NA 138 04/26/2015   K 4.2 04/26/2015   CL 103 04/26/2015   CO2 24 04/26/2015    No  results found for: HGBA1C Lipid Panel     Component Value Date/Time   CHOL 237* 06/24/2013 1036   TRIG 74 06/24/2013 1036   HDL 65 06/24/2013 1036   CHOLHDL 3.6 06/24/2013 1036   VLDL 15 06/24/2013 1036   LDLCALC 157* 06/24/2013 1036       Assessment and plan:   Candace Middleton was seen today for annual exam.  Diagnoses and all orders for this visit:  Papanicolaou smear Orders: -     TSH -     Lipid panel -     Vitamin D, 25-hydroxy -     Cytology - PAP  Smoker Smoking cessation discussed for 3 minutes, patient is not willing to quit at this time. Will continue to assess on each visit. Discussed increased risk for diseases such as cancer, heart disease, and stroke.   Seborrheic keratoses Patient will call me back when she is ready to have dermatology referral placed.   Return if symptoms worsen or fail to improve.   Ambrose Finland, NP-C [facility] [phone] 07/12/2015, 2:24 PM

## 2015-07-12 NOTE — Patient Instructions (Signed)
Smoking Cessation Quitting smoking is important to your health and has many advantages. However, it is not always easy to quit since nicotine is a very addictive drug. Oftentimes, people try 3 times or more before being able to quit. This document explains the best ways for you to prepare to quit smoking. Quitting takes hard work and a lot of effort, but you can do it. ADVANTAGES OF QUITTING SMOKING  You will live longer, feel better, and live better.  Your body will feel the impact of quitting smoking almost immediately.  Within 20 minutes, blood pressure decreases. Your pulse returns to its normal level.  After 8 hours, carbon monoxide levels in the blood return to normal. Your oxygen level increases.  After 24 hours, the chance of having a heart attack starts to decrease. Your breath, hair, and body stop smelling like smoke.  After 48 hours, damaged nerve endings begin to recover. Your sense of taste and smell improve.  After 72 hours, the body is virtually free of nicotine. Your bronchial tubes relax and breathing becomes easier.  After 2 to 12 weeks, lungs can hold more air. Exercise becomes easier and circulation improves.  The risk of having a heart attack, stroke, cancer, or lung disease is greatly reduced.  After 1 year, the risk of coronary heart disease is cut in half.  After 5 years, the risk of stroke falls to the same as a nonsmoker.  After 10 years, the risk of lung cancer is cut in half and the risk of other cancers decreases significantly.  After 15 years, the risk of coronary heart disease drops, usually to the level of a nonsmoker.  If you are pregnant, quitting smoking will improve your chances of having a healthy baby.  The people you live with, especially any children, will be healthier.  You will have extra money to spend on things other than cigarettes. QUESTIONS TO THINK ABOUT BEFORE ATTEMPTING TO QUIT You may want to talk about your answers with your  health care provider.  Why do you want to quit?  If you tried to quit in the past, what helped and what did not?  What will be the most difficult situations for you after you quit? How will you plan to handle them?  Who can help you through the tough times? Your family? Friends? A health care provider?  What pleasures do you get from smoking? What ways can you still get pleasure if you quit? Here are some questions to ask your health care provider:  How can you help me to be successful at quitting?  What medicine do you think would be best for me and how should I take it?  What should I do if I need more help?  What is smoking withdrawal like? How can I get information on withdrawal? GET READY  Set a quit date.  Change your environment by getting rid of all cigarettes, ashtrays, matches, and lighters in your home, car, or work. Do not let people smoke in your home.  Review your past attempts to quit. Think about what worked and what did not. GET SUPPORT AND ENCOURAGEMENT You have a better chance of being successful if you have help. You can get support in many ways.  Tell your family, friends, and coworkers that you are going to quit and need their support. Ask them not to smoke around you.  Get individual, group, or telephone counseling and support. Programs are available at local hospitals and health centers. Call   your local health department for information about programs in your area.  Spiritual beliefs and practices may help some smokers quit.  Download a "quit meter" on your computer to keep track of quit statistics, such as how long you have gone without smoking, cigarettes not smoked, and money saved.  Get a self-help book about quitting smoking and staying off tobacco. LEARN NEW SKILLS AND BEHAVIORS  Distract yourself from urges to smoke. Talk to someone, go for a walk, or occupy your time with a task.  Change your normal routine. Take a different route to work.  Drink tea instead of coffee. Eat breakfast in a different place.  Reduce your stress. Take a hot bath, exercise, or read a book.  Plan something enjoyable to do every day. Reward yourself for not smoking.  Explore interactive web-based programs that specialize in helping you quit. GET MEDICINE AND USE IT CORRECTLY Medicines can help you stop smoking and decrease the urge to smoke. Combining medicine with the above behavioral methods and support can greatly increase your chances of successfully quitting smoking.  Nicotine replacement therapy helps deliver nicotine to your body without the negative effects and risks of smoking. Nicotine replacement therapy includes nicotine gum, lozenges, inhalers, nasal sprays, and skin patches. Some may be available over-the-counter and others require a prescription.  Antidepressant medicine helps people abstain from smoking, but how this works is unknown. This medicine is available by prescription.  Nicotinic receptor partial agonist medicine simulates the effect of nicotine in your brain. This medicine is available by prescription. Ask your health care provider for advice about which medicines to use and how to use them based on your health history. Your health care provider will tell you what side effects to look out for if you choose to be on a medicine or therapy. Carefully read the information on the package. Do not use any other product containing nicotine while using a nicotine replacement product.  RELAPSE OR DIFFICULT SITUATIONS Most relapses occur within the first 3 months after quitting. Do not be discouraged if you start smoking again. Remember, most people try several times before finally quitting. You may have symptoms of withdrawal because your body is used to nicotine. You may crave cigarettes, be irritable, feel very hungry, cough often, get headaches, or have difficulty concentrating. The withdrawal symptoms are only temporary. They are strongest  when you first quit, but they will go away within 10-14 days. To reduce the chances of relapse, try to:  Avoid drinking alcohol. Drinking lowers your chances of successfully quitting.  Reduce the amount of caffeine you consume. Once you quit smoking, the amount of caffeine in your body increases and can give you symptoms, such as a rapid heartbeat, sweating, and anxiety.  Avoid smokers because they can make you want to smoke.  Do not let weight gain distract you. Many smokers will gain weight when they quit, usually less than 10 pounds. Eat a healthy diet and stay active. You can always lose the weight gained after you quit.  Find ways to improve your mood other than smoking. FOR MORE INFORMATION  www.smokefree.gov  Document Released: 12/05/2001 Document Revised: 04/27/2014 Document Reviewed: 03/21/2012 [employer] Patient Information 2015 [employer]. This information is not intended to replace advice given to you by your health care provider. Make sure you discuss any questions you have with your health care provider.  

## 2015-07-13 LAB — CYTOLOGY - PAP

## 2015-07-13 LAB — CERVICOVAGINAL ANCILLARY ONLY: Wet Prep (BD Affirm): POSITIVE — AB

## 2015-07-13 LAB — VITAMIN D 25 HYDROXY (VIT D DEFICIENCY, FRACTURES): Vit D, 25-Hydroxy: 29 ng/mL — ABNORMAL LOW (ref 30–100)

## 2015-07-16 ENCOUNTER — Telehealth: Payer: Self-pay

## 2015-07-16 DIAGNOSIS — N760 Acute vaginitis: Principal | ICD-10-CM

## 2015-07-16 DIAGNOSIS — B9689 Other specified bacterial agents as the cause of diseases classified elsewhere: Secondary | ICD-10-CM

## 2015-07-16 MED ORDER — METRONIDAZOLE 500 MG PO TABS
500.0000 mg | ORAL_TABLET | Freq: Two times a day (BID) | ORAL | Status: DC
Start: 2015-07-16 — End: 2015-08-13

## 2015-07-16 NOTE — Telephone Encounter (Signed)
See previous note

## 2015-07-16 NOTE — Telephone Encounter (Signed)
Nurse called patient, patient verified date of birth. Patient aware of the following: Cholesterol is really elevated, no change from 2 years ago. Patient aware of limiting breads pasta, rice, butters, fried foods, etc. Patient agrees to pick up Lipitor 20 mg to take every evening with dinner. Patient aware of high cholesterol placing her at risk for stroke and heart disease.  Patient aware of no malignancies on cytology. Patient agrees to recommendation of repeating in 3 years.  Patient aware positive for Bacterial Vaginosis . Nurse explained this is not a STD, but a imbalance of the vaginal pH. Patient agrees to pick up Flagyl 500 mg BID for 7 days. No refills, Patient aware of no alcohol while on this medication.  Flagyl prescription sent.  Patient voices understanding and has no further questions at this time.

## 2015-07-16 NOTE — Telephone Encounter (Signed)
-----   Message from Lance Bosch, NP sent at 07/13/2015  6:06 PM EDT ----- Patient positive for Bacterial Vaginosis . Please explain this is not a STD, but a imbalance of the vaginal pH. Please send Flagyl 500 mg BID for 7 days. No refills, no alcohol while on this medication.

## 2015-08-10 ENCOUNTER — Telehealth: Payer: Self-pay | Admitting: Clinical

## 2015-08-10 NOTE — Telephone Encounter (Signed)
Pt returned [facility] call; pt introduced to [facility] services, says she will make an appointment to see [facility] at next PCP appointment.

## 2015-08-13 ENCOUNTER — Ambulatory Visit: Payer: Self-pay | Attending: Internal Medicine | Admitting: Physician Assistant

## 2015-08-13 ENCOUNTER — Other Ambulatory Visit: Payer: Self-pay

## 2015-08-13 ENCOUNTER — Encounter: Payer: Self-pay | Admitting: Physician Assistant

## 2015-08-13 VITALS — BP 126/82 | HR 78 | Temp 97.1°F | Ht 60.0 in | Wt 96.6 lb

## 2015-08-13 DIAGNOSIS — R079 Chest pain, unspecified: Secondary | ICD-10-CM | POA: Insufficient documentation

## 2015-08-13 DIAGNOSIS — R9431 Abnormal electrocardiogram [ECG] [EKG]: Secondary | ICD-10-CM | POA: Insufficient documentation

## 2015-08-13 DIAGNOSIS — Z7982 Long term (current) use of aspirin: Secondary | ICD-10-CM | POA: Insufficient documentation

## 2015-08-13 DIAGNOSIS — I2510 Atherosclerotic heart disease of native coronary artery without angina pectoris: Secondary | ICD-10-CM | POA: Insufficient documentation

## 2015-08-13 DIAGNOSIS — N390 Urinary tract infection, site not specified: Secondary | ICD-10-CM | POA: Insufficient documentation

## 2015-08-13 DIAGNOSIS — Z79899 Other long term (current) drug therapy: Secondary | ICD-10-CM | POA: Insufficient documentation

## 2015-08-13 DIAGNOSIS — Z8249 Family history of ischemic heart disease and other diseases of the circulatory system: Secondary | ICD-10-CM | POA: Insufficient documentation

## 2015-08-13 DIAGNOSIS — F17210 Nicotine dependence, cigarettes, uncomplicated: Secondary | ICD-10-CM | POA: Insufficient documentation

## 2015-08-13 LAB — POCT URINALYSIS DIPSTICK
Bilirubin, UA: NEGATIVE
Glucose, UA: NEGATIVE
Ketones, UA: NEGATIVE
Nitrite, UA: POSITIVE
Protein, UA: NEGATIVE
Spec Grav, UA: 1.01
Urobilinogen, UA: 0.2
pH, UA: 6.5

## 2015-08-13 MED ORDER — ASPIRIN EC 81 MG PO TBEC: 81.0000 mg | DELAYED_RELEASE_TABLET | Freq: Every day | ORAL | Status: AC

## 2015-08-13 MED ORDER — CIPROFLOXACIN HCL 500 MG PO TABS: 500.0000 mg | ORAL_TABLET | Freq: Two times a day (BID) | ORAL | Status: DC

## 2015-08-13 MED ORDER — METOPROLOL SUCCINATE ER 25 MG PO TB24: 25.0000 mg | ORAL_TABLET | Freq: Every day | ORAL | Status: DC

## 2015-08-13 MED ORDER — ATORVASTATIN CALCIUM 20 MG PO TABS: 20.0000 mg | ORAL_TABLET | Freq: Every day | ORAL | Status: DC

## 2015-08-13 NOTE — Progress Notes (Signed)
Patient complaining of dysuria and polyuria She also has had a couple episodes of chest pain that radiates to her jaw She describes it as 8/10 squeezing pain She is smoking .025 ppd cigarettes

## 2015-08-13 NOTE — Progress Notes (Signed)
Chief Complaint: I think I have a UTI and I have been having some chest pain  Subjective: This is a pleasant 54 year old white female with no prior history of coronary artery disease but was a risk factors who is presenting with some chest discomfort intermittently for 2 months. For the last 2-3 days she's been having worsening episodes. She describes a right above the sternal chest pressure or squeezing that is been intermittent since Sunday or Monday. There was some radiation earlier this week into her neck and jaw. She had a normal EKG in 2015. LDL was recently checked and 160. She does have family history of premature coronary artery disease. She smokes three quarters of a pack per day of cigarettes.  She also has had 3 days of urinary frequency. She's had burning when she urinates as well. She's had constant spasms. She denies any hematuria. She denies foul odor.   ROS: GEN: denies fever or chills, denies change in weight HEENT: denies headache, earache, epistaxis, sore throat, or neck pain LUNGS: denies SHOB, dyspnea, PND, orthopnea CV: denies CP or palpitations EXT: denies muscle spasms or swelling; no pain in lower ext, no weakness NEURO: denies numbness or tingling, denies sz, stroke or TIA   Objective:  Filed Vitals:   08/13/15 1659  BP: 126/82  Pulse: 78  Temp: 97.1 F (36.2 C)  Height: 5' (1.524 m)  Weight: 96 lb 9.6 oz (43.817 kg)  SpO2: 78%    Physical Exam:  General: in no acute distress. Heart: Normal  s1 &s2  Regular rate and rhythm, without murmurs, rubs, gallops. Lungs: Clear to auscultation bilaterally. Extremities: No clubbing cyanosis or edema with positive pedal pulses. Neuro: Alert, awake, oriented x3, nonfocal.  Pertinent Lab Results: Urine dipstick abnormal; EKG sinus rhythm with lateral ST-T wave changes   Medications: Prior to Admission medications   Medication Sig Start Date End Date Taking? Authorizing Provider  albuterol (PROVENTIL  HFA;VENTOLIN HFA) 108 (90 BASE) MCG/ACT inhaler Inhale 2 puffs into the lungs every 4 (four) hours as needed for wheezing (cough, shortness of breath or wheezing.). 05/19/15  Yes Olugbemiga E Doreene Burke, MD  oxybutynin (DITROPAN) 5 MG tablet Take 5 mg by mouth 3 (three) times daily.   Yes Historical Provider, MD  tiZANidine (ZANAFLEX) 4 MG tablet Take 4 mg by mouth at bedtime.   Yes Historical Provider, MD  topiramate (TOPAMAX) 100 MG tablet Take 100 mg by mouth 2 (two) times daily.   Yes Historical Provider, MD  aspirin EC 81 MG tablet Take 1 tablet (81 mg total) by mouth daily. 08/13/15   Toyoko Silos Daneil Dan, PA-C  atorvastatin (LIPITOR) 20 MG tablet Take 1 tablet (20 mg total) by mouth daily. 08/13/15   Brennden Masten Daneil Dan, PA-C  metoprolol succinate (TOPROL-XL) 25 MG 24 hr tablet Take 1 tablet (25 mg total) by mouth daily. 08/13/15   Brayton Caves, PA-C  tiotropium (SPIRIVA HANDIHALER) 18 MCG inhalation capsule Place 1 capsule (18 mcg total) into inhaler and inhale Nightly. 05/19/15   Tresa Garter, MD    Assessment: 1. UTI 2. Chest Pain ?etiology suspicious for angina 3. Abn EKG 4. HL 5. Smoker 6. Fam hx CAD  Plan: Dipstick + for UTI>Cipro. I have encouraged her to go to the emergency department but patient is adamant that she not be sent to the emergency department. She cares for her sickly husband (lung cancer) and she also has a son with mental health issues. She became very emotional at the thought of  going to the emergency department. I'll check cardiac enzymes. If they are positive, she must go to the emergency department for further evaluation. If they are negative we'll bring her back in 1 week for follow-up after the below adjustments. Aggressive RF modification. Add ASA, BB, Statin. Smoking cessation.  Follow up:1 week  The patient was given clear instructions to go to ER or return to medical center if symptoms don't improve, worsen or new problems develop. The patient verbalized  understanding. The patient was told to call to get lab results if they haven't heard anything in the next week.   This note has been created with Surveyor, quantity. Any transcriptional errors are unintentional.   Zettie Pho, PA-C 08/13/2015, 5:11 PM

## 2015-08-14 LAB — TROPONIN I: Troponin I: <0.01 ng/mL (ref <0.06)

## 2015-08-14 LAB — CK TOTAL AND CKMB (NOT AT ARMC)
CK, MB: 1.6 ng/mL (ref 0.0–5.0)
Relative Index: 2.4 (ref 0.0–4.0)
Total CK: 68 U/L (ref 7–177)

## 2015-08-16 ENCOUNTER — Telehealth: Payer: Self-pay | Admitting: Internal Medicine

## 2015-08-16 NOTE — Telephone Encounter (Signed)
Patient called requesting her results. Please follow up with pt. Thank you.

## 2015-08-16 NOTE — Telephone Encounter (Signed)
Patient called requesting to speak to nurse to review results, patient states she was suppose to be contacted this weekend but hasn't heard anything. Please f/u with patient

## 2015-08-17 ENCOUNTER — Telehealth: Payer: Self-pay

## 2015-08-17 NOTE — Telephone Encounter (Signed)
Patient called requesting her lab results Please review and advise

## 2015-08-17 NOTE — Telephone Encounter (Signed)
Patient called to request her blood work results, please f/u with pt.

## 2015-08-19 ENCOUNTER — Telehealth: Payer: Self-pay

## 2015-08-19 ENCOUNTER — Encounter: Payer: Self-pay | Admitting: Physician Assistant

## 2015-08-19 NOTE — Telephone Encounter (Signed)
-----   Message from Brayton Caves, [place] sent at 08/19/2015  8:46 AM EDT ----- Thank Garald Balding!  Please let her know that her labs were normal. Please ask if she is still having chest pain? If so, advise to go to the ED. If not, encourage her to keep upcoming appointment.   [place]

## 2015-08-19 NOTE — Telephone Encounter (Signed)
Spoke with patient in regards to her lab results Patient states still having some discomfort in her chest But not like before-better Patient is aware and verbalized understanding that if chest pains get worse She should be seen in the ed

## 2015-08-23 ENCOUNTER — Ambulatory Visit: Payer: Self-pay | Attending: Internal Medicine | Admitting: Internal Medicine

## 2015-08-23 ENCOUNTER — Encounter: Payer: Self-pay | Admitting: Internal Medicine

## 2015-08-23 ENCOUNTER — Other Ambulatory Visit: Payer: Self-pay

## 2015-08-23 ENCOUNTER — Ambulatory Visit (HOSPITAL_COMMUNITY)
Admission: RE | Admit: 2015-08-23 | Discharge: 2015-08-23 | Disposition: A | Discharge status: Home / Self Care | Payer: Self-pay | Source: Ambulatory Visit | Attending: Internal Medicine | Admitting: Internal Medicine

## 2015-08-23 VITALS — BP 129/85 | HR 73 | Temp 98.0°F | Resp 16 | Ht 60.0 in | Wt 97.0 lb

## 2015-08-23 DIAGNOSIS — Z87440 Personal history of urinary (tract) infections: Secondary | ICD-10-CM | POA: Insufficient documentation

## 2015-08-23 DIAGNOSIS — R079 Chest pain, unspecified: Secondary | ICD-10-CM | POA: Insufficient documentation

## 2015-08-23 LAB — POCT URINALYSIS DIPSTICK
Bilirubin, UA: NEGATIVE
Blood, UA: NEGATIVE
Glucose, UA: NEGATIVE
Ketones, UA: NEGATIVE
Leukocytes, UA: NEGATIVE
Nitrite, UA: NEGATIVE
Protein, UA: NEGATIVE
Spec Grav, UA: 1.015
Urobilinogen, UA: 0.2
pH, UA: 7.5

## 2015-08-23 NOTE — Progress Notes (Signed)
Patient here for follow up from her UTI Did a urine dip here if the office

## 2015-08-23 NOTE — Progress Notes (Signed)
Patient ID: Candace Middleton, female   DOB: Sep 07, 1961, 54 y.o.   MRN: [id number]  CC: UTI follow up, chest pain   HPI: Candace Middleton is a 54 y.o. female here today for a follow up visit.  Patient has past medical history of bladder cancer. Patient was seen here last week for dysuria and was diagnosed with urinary tract infection. She reports that she has completed Cipro and feels substantially better. She denies dysuria, flank pain, hematuria, fever, chills.  Patient was also seen last week for symptoms of chest pain by walk in provider. At that time her EKG was abnormal and revealed left atrial enlargement. Patient did not go to the ER as advised at that time because she states that she is the only care giver to her sick husband and mentally challenged child. At that time she was started on low dose aspirin, beta blocker, statin, and counseled on smoking cessation. She continues to smoke and reports slight improvement in chest pain since beginning medication. Chest pain is mid sternum and does not radiate. Pain is squeezing and happens at rest and with activity. Pain is not associated with SOB and only last for a brief second. She reports increased stress recently.   Allergies  Allergen Reactions  . Sulfa Antibiotics Anaphylaxis  . Gabapentin Swelling    Patient reports facial swelling   Past Medical History  Diagnosis Date  . Neuromuscular disorder 2014    RSD - nerve blocks in back for treatment   . Cancer     bladder ca   Current Outpatient Prescriptions on File Prior to Visit  Medication Sig Dispense Refill  . albuterol (PROVENTIL HFA;VENTOLIN HFA) 108 (90 BASE) MCG/ACT inhaler Inhale 2 puffs into the lungs every 4 (four) hours as needed for wheezing (cough, shortness of breath or wheezing.). 3 Inhaler 3  . aspirin EC 81 MG tablet Take 1 tablet (81 mg total) by mouth daily. 30 tablet 3  . atorvastatin (LIPITOR) 20 MG tablet Take 1 tablet (20 mg total) by mouth daily. 30 tablet 3   . metoprolol succinate (TOPROL-XL) 25 MG 24 hr tablet Take 1 tablet (25 mg total) by mouth daily. 30 tablet 3  . oxybutynin (DITROPAN) 5 MG tablet Take 5 mg by mouth 3 (three) times daily.    Marland Kitchen tiotropium (SPIRIVA HANDIHALER) 18 MCG inhalation capsule Place 1 capsule (18 mcg total) into inhaler and inhale Nightly. 90 capsule 3  . tiZANidine (ZANAFLEX) 4 MG tablet Take 4 mg by mouth at bedtime.    . topiramate (TOPAMAX) 100 MG tablet Take 100 mg by mouth 2 (two) times daily.    . [DISCONTINUED] gabapentin (NEURONTIN) 300 MG capsule Take 1 capsule (300 mg total) by mouth 2 (two) times daily. (Patient not taking: Reported on 04/29/2015) 60 capsule 2   No current facility-administered medications on file prior to visit.   Family History  Problem Relation Age of Onset  . Heart disease Mother   . Thyroid disease Sister   . Diabetes Brother   . Epilepsy Son   . Early death Father   . Pulmonary embolism Father   . Colon cancer Neg Hx    Social History   Social History  . Marital Status: Married    Spouse Name: N/A  . Number of Children: N/A  . Years of Education: N/A   Occupational History  . 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  Smoking status: Current Some Day Smoker -- 0.25 packs/day  . Smokeless tobacco: Never Used  . Alcohol Use: No  . Drug Use: No  . Sexual Activity: Yes   Other Topics Concern  . Not on file   Social History Narrative   Husband is currently undergoing chemotherapy for lung cancer. Son has recently been diagnosed with optic neuritis and has multi-handicaps.     Review of Systems: Other than what is stated in HPI, all other systems are negative.   Objective:   Filed Vitals:   08/23/15 1226  BP: 129/85  Pulse: 73  Temp: 98 F (36.7 C)  Resp: 16    Physical Exam  Constitutional: She is oriented to person, place, and time.  Cardiovascular: Normal rate, regular rhythm and normal heart  sounds.   No murmur heard. Pulmonary/Chest: Effort normal and breath sounds normal. She has no wheezes.  Abdominal:  No CVA tenderness  Musculoskeletal: She exhibits no tenderness (nonreproducible chest pain).  Neurological: She is alert and oriented to person, place, and time.     Lab Results  Component Value Date   WBC 8.8 04/26/2015   HGB 14.8 04/26/2015   HCT 44.0 04/26/2015   MCV 92.1 04/26/2015   PLT 334 04/26/2015   Lab Results  Component Value Date   CREATININE 0.68 04/26/2015   BUN 10 04/26/2015   NA 138 04/26/2015   K 4.2 04/26/2015   CL 103 04/26/2015   CO2 24 04/26/2015    No results found for: HGBA1C Lipid Panel     Component Value Date/Time   CHOL 237* 07/12/2015 1446   TRIG 61 07/12/2015 1446   HDL 65 07/12/2015 1446   CHOLHDL 3.6 07/12/2015 1446   VLDL 12 07/12/2015 1446   LDLCALC 160* 07/12/2015 1446       Assessment and plan:   Candace Middleton was seen today for follow-up.  Diagnoses and all orders for this visit:  Chest pain, unspecified chest pain type -     EKG 12-Lead EKG: normal EKG, normal sinus rhythm, improved from previous tracing. I have advised patient to continue daily aspirin, BB, and statin for risk modification. Discussed case with Dr. Armen Pickup. I will send patient to Dr. Daleen Squibb for a cardiology consult for evaluation of persistent chest pain. Explained signs and symptoms that should warrant immediate attention.  Patient verbalized understanding with teach back used.  Tobacco use disorder Smoking cessation discussed for 3 minutes, patient is not willing to quit at this time. Will continue to assess on each visit. Discussed increased risk for diseases such as cancer, heart disease, and stroke. I have explained that if she wants to decrease her risk for MI she needs to stop smoking today.   History of UTI -     Urinalysis Dipstick Resolved UTI.    Return for Wednesday Dr. Daleen Squibb and 6 mo PCP .    Ambrose Finland, NP-C [facility] [phone] 08/23/2015, 12:35 PM

## 2015-09-01 ENCOUNTER — Encounter: Payer: Self-pay | Admitting: Cardiology

## 2015-09-01 ENCOUNTER — Ambulatory Visit: Payer: Self-pay | Attending: Cardiology | Admitting: Cardiology

## 2015-09-01 VITALS — BP 126/85 | HR 81 | Temp 98.0°F | Resp 18 | Ht 60.0 in | Wt 97.2 lb

## 2015-09-01 DIAGNOSIS — I25118 Atherosclerotic heart disease of native coronary artery with other forms of angina pectoris: Secondary | ICD-10-CM | POA: Insufficient documentation

## 2015-09-01 DIAGNOSIS — Z720 Tobacco use: Secondary | ICD-10-CM | POA: Insufficient documentation

## 2015-09-01 DIAGNOSIS — F17200 Nicotine dependence, unspecified, uncomplicated: Secondary | ICD-10-CM

## 2015-09-01 DIAGNOSIS — E785 Hyperlipidemia, unspecified: Secondary | ICD-10-CM | POA: Insufficient documentation

## 2015-09-01 DIAGNOSIS — I2510 Atherosclerotic heart disease of native coronary artery without angina pectoris: Secondary | ICD-10-CM | POA: Insufficient documentation

## 2015-09-01 LAB — CBC WITH DIFFERENTIAL/PLATELET
Basophils Absolute: 0 10*3/uL (ref 0.0–0.1)
Basophils Relative: 0 % (ref 0–1)
Eosinophils Absolute: 0.2 10*3/uL (ref 0.0–0.7)
Eosinophils Relative: 2 % (ref 0–5)
HCT: 43.2 % (ref 36.0–46.0)
Hemoglobin: 14.3 g/dL (ref 12.0–15.0)
Lymphocytes Relative: 45 % (ref 12–46)
Lymphs Abs: 3.6 10*3/uL (ref 0.7–4.0)
MCH: 29.8 pg (ref 26.0–34.0)
MCHC: 33.1 g/dL (ref 30.0–36.0)
MCV: 90 fL (ref 78.0–100.0)
MPV: 10 fL (ref 8.6–12.4)
Monocytes Absolute: 0.6 10*3/uL (ref 0.1–1.0)
Monocytes Relative: 8 % (ref 3–12)
Neutro Abs: 3.6 10*3/uL (ref 1.7–7.7)
Neutrophils Relative %: 45 % (ref 43–77)
Platelets: 319 10*3/uL (ref 150–400)
RBC: 4.8 MIL/uL (ref 3.87–5.11)
RDW: 13.7 % (ref 11.5–15.5)
WBC: 8 10*3/uL (ref 4.0–10.5)

## 2015-09-01 LAB — COMPLETE METABOLIC PANEL WITH GFR
ALT: 11 U/L (ref 6–29)
AST: 16 U/L (ref 10–35)
Albumin: 4.4 g/dL (ref 3.6–5.1)
Alkaline Phosphatase: 57 U/L (ref 33–130)
BUN: 12 mg/dL (ref 7–25)
CO2: 26 mmol/L (ref 20–31)
Calcium: 9.5 mg/dL (ref 8.6–10.4)
Chloride: 106 mmol/L (ref 98–110)
Creat: 0.64 mg/dL (ref 0.50–1.05)
GFR, Est African American: >89 mL/min (ref >=60)
GFR, Est Non African American: >89 mL/min (ref >=60)
Glucose, Bld: 76 mg/dL (ref 65–99)
Potassium: 4.9 mmol/L (ref 3.5–5.3)
Sodium: 140 mmol/L (ref 135–146)
Total Bilirubin: 0.3 mg/dL (ref 0.2–1.2)
Total Protein: 6.2 g/dL (ref 6.1–8.1)

## 2015-09-01 NOTE — Progress Notes (Signed)
HPI Candace Middleton is referred today by Chari Manning for the evaluation and management of chest discomfort. This occurred about 3 weeks ago while walking in a store with her son. She had severe chest pressure that radiated into both shoulders into her jaw. Her son says she looked pale. She had no other associated symptoms other than some shortness of breath. She has emphysema.  It went away after a few minutes.  She was evaluated in the clinic a couple days later. EKG showed some minimal ST segment changes but overall normal.  She has multiple cardiac risk factors including advancing age, heavy tobacco use since she was a teenager, and LDL 160 lower HDL of 65.   She's had no rest angina. She's had a couple episodes of chest pressure into her arm with minimal activity.  She was started on Lopressor, aspirin and Lipitor.   Past Medical History  Diagnosis Date  . Neuromuscular disorder 2014    RSD - nerve blocks in back for treatment   . Cancer     bladder ca    Current Outpatient Prescriptions  Medication Sig Dispense Refill  . albuterol (PROVENTIL HFA;VENTOLIN HFA) 108 (90 BASE) MCG/ACT inhaler Inhale 2 puffs into the lungs every 4 (four) hours as needed for wheezing (cough, shortness of breath or wheezing.). 3 Inhaler 3  . aspirin EC 81 MG tablet Take 1 tablet (81 mg total) by mouth daily. 30 tablet 3  . atorvastatin (LIPITOR) 20 MG tablet Take 1 tablet (20 mg total) by mouth daily. 30 tablet 3  . oxybutynin (DITROPAN) 5 MG tablet Take 5 mg by mouth 3 (three) times daily.    Marland Kitchen tiotropium (SPIRIVA HANDIHALER) 18 MCG inhalation capsule Place 1 capsule (18 mcg total) into inhaler and inhale Nightly. 90 capsule 3  . tiZANidine (ZANAFLEX) 4 MG tablet Take 4 mg by mouth at bedtime.    . topiramate (TOPAMAX) 100 MG tablet Take 100 mg by mouth 2 (two) times daily.    . metoprolol succinate (TOPROL-XL) 25 MG 24 hr tablet Take 1 tablet (25 mg total) by mouth daily. (Patient not taking:  Reported on 09/01/2015) 30 tablet 3  . [DISCONTINUED] gabapentin (NEURONTIN) 300 MG capsule Take 1 capsule (300 mg total) by mouth 2 (two) times daily. (Patient not taking: Reported on 04/29/2015) 60 capsule 2   No current facility-administered medications for this visit.    Allergies  Allergen Reactions  . Sulfa Antibiotics Anaphylaxis  . Gabapentin Swelling    Patient reports facial swelling    Family History  Problem Relation Age of Onset  . Heart disease Mother   . Thyroid disease Sister   . Diabetes Brother   . Epilepsy Son   . Early death Father   . Pulmonary embolism Father   . Colon cancer Neg Hx     Social History   Social History  . Marital Status: Married    Spouse Name: N/A  . Number of Children: N/A  . Years of Education: N/A   Occupational History  . 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 Smoking status: Current Some Day Smoker -- 0.50 packs/day  . Smokeless tobacco: Never Used  . Alcohol Use: No  . Drug Use: No  . Sexual Activity: Yes   Other Topics Concern  . Not on file   Social History Narrative   Husband is currently undergoing chemotherapy for lung cancer. Son  has recently been diagnosed with optic neuritis and has multi-handicaps.     ROS ALL NEGATIVE EXCEPT THOSE NOTED IN HPI  PE  General Appearance: well developed, well nourished in no acute distress, thin HEENT: symmetrical face, PERRLA, good dentition  Neck: no JVD, thyromegaly, or adenopathy, trachea midline Chest: symmetric without deformity Cardiac: PMI non-displaced, RRR, normal S1, S2, no gallop or murmur Lung: clear to ausculation and percussion Vascular: all pulses full without bruits  Abdominal: nondistended, nontender, good bowel sounds, no HSM, no bruits Extremities: no cyanosis, clubbing or edema, no sign of DVT, no varicosities  Skin: normal color, no rashes Neuro: alert and oriented x 3, non-focal Pysch: normal  affect  EKG EKG reviewed, normal sinus rhythm, no ST segment changes. BMET    Component Value Date/Time   NA 138 04/26/2015 1151   K 4.2 04/26/2015 1151   CL 103 04/26/2015 1151   CO2 24 04/26/2015 1151   GLUCOSE 90 04/26/2015 1151   BUN 10 04/26/2015 1151   CREATININE 0.68 04/26/2015 1151   CREATININE 0.68 08/13/2014 1050   CALCIUM 9.4 04/26/2015 1151   GFRNONAA >90 08/13/2014 1050   GFRAA >90 08/13/2014 1050    Lipid Panel     Component Value Date/Time   CHOL 237* 07/12/2015 1446   TRIG 61 07/12/2015 1446   HDL 65 07/12/2015 1446   CHOLHDL 3.6 07/12/2015 1446   VLDL 12 07/12/2015 1446   LDLCALC 160* 07/12/2015 1446    CBC    Component Value Date/Time   WBC 8.8 04/26/2015 1151   WBC 7.1 04/20/2014 0915   RBC 4.78 04/26/2015 1151   RBC 4.97 04/20/2014 0915   HGB 14.8 04/26/2015 1151   HGB 15.8 04/20/2014 0915   HCT 44.0 04/26/2015 1151   HCT 46.6 04/20/2014 0915   PLT 334 04/26/2015 1151   MCV 92.1 04/26/2015 1151   MCV 93.7 04/20/2014 0915   MCH 31.0 04/26/2015 1151   MCH 31.8* 04/20/2014 0915   MCHC 33.6 04/26/2015 1151   MCHC 33.9 04/20/2014 0915   RDW 13.3 04/26/2015 1151   LYMPHSABS 3.1 04/26/2015 1151   MONOABS 0.9 04/26/2015 1151   EOSABS 0.1 04/26/2015 1151   BASOSABS 0.0 04/26/2015 1151

## 2015-09-01 NOTE — Assessment & Plan Note (Signed)
She is having exertional angina with one bad episode about 3 weeks ago. She has multiple risk factors. She will continue her Lopressor, statin, and aspirin. I have recommended a cardiac [facility]. Indications, risks, potential benefit has been discussed. She agrees with the plan.

## 2015-09-01 NOTE — Progress Notes (Signed)
Pt referred by [facility] for persistent chest pain. Pt reports discomfort in her chest that comes and go rated at a 1. Pt reports chest pain that has been present for a few months. Pt states the pain happens anytime and described it as tightness and pressure. Pain radiates to arm sometimes. Pt reports that last month around 8/16 she was out running errands with her son and experienced crushing pressure in her mid chest that radiated to her neck, both arms, and her jaw. Pt states this was the first and only time she has experience this and she was extremely tired after. Pt reports SOB which occurs anytime due to her having COPD. Pt reports wheezing sometimes when it is hot outside. Pt reports edema in her ankles and feet sometimes. Pt states she has nerve damage in her left foot so she thinks that contributes to her edema. Pt has taken her medications today and does not need refills. Pt questions if she should be taking metoprolol since she does not HTN. Pt reports she has not been taking it because she was concerned it would drop her BP too low.

## 2015-09-01 NOTE — Patient Instructions (Addendum)
Thank you for visiting with Dr. Verl Blalock today. Please arrive to your [facility] appointment at 7:00am. Nothing by mouth after 12:00am on 9/13 except for a sip of water with medications.

## 2015-09-02 LAB — PROTIME-INR
INR: 0.91 (ref <1.50)
Prothrombin Time: 12.4 seconds (ref 11.6–15.2)

## 2015-09-07 ENCOUNTER — Encounter (HOSPITAL_COMMUNITY): Payer: Self-pay | Admitting: Interventional Cardiology

## 2015-09-07 ENCOUNTER — Ambulatory Visit (HOSPITAL_COMMUNITY)
Admission: RE | Admit: 2015-09-07 | Discharge: 2015-09-07 | Disposition: A | Discharge status: Home / Self Care | Payer: Self-pay | Source: Ambulatory Visit | Attending: Interventional Cardiology | Admitting: Interventional Cardiology

## 2015-09-07 ENCOUNTER — Encounter (HOSPITAL_COMMUNITY)
Admission: RE | Disposition: A | Discharge status: Home / Self Care | Payer: Self-pay | Source: Ambulatory Visit | Attending: Interventional Cardiology

## 2015-09-07 DIAGNOSIS — C3490 Malignant neoplasm of unspecified part of unspecified bronchus or lung: Secondary | ICD-10-CM | POA: Insufficient documentation

## 2015-09-07 DIAGNOSIS — I2510 Atherosclerotic heart disease of native coronary artery without angina pectoris: Secondary | ICD-10-CM | POA: Insufficient documentation

## 2015-09-07 DIAGNOSIS — Z8551 Personal history of malignant neoplasm of bladder: Secondary | ICD-10-CM | POA: Insufficient documentation

## 2015-09-07 DIAGNOSIS — Z79899 Other long term (current) drug therapy: Secondary | ICD-10-CM | POA: Insufficient documentation

## 2015-09-07 DIAGNOSIS — R072 Precordial pain: Secondary | ICD-10-CM | POA: Insufficient documentation

## 2015-09-07 DIAGNOSIS — J439 Emphysema, unspecified: Secondary | ICD-10-CM | POA: Insufficient documentation

## 2015-09-07 DIAGNOSIS — Z7982 Long term (current) use of aspirin: Secondary | ICD-10-CM | POA: Insufficient documentation

## 2015-09-07 DIAGNOSIS — F17210 Nicotine dependence, cigarettes, uncomplicated: Secondary | ICD-10-CM | POA: Insufficient documentation

## 2015-09-07 HISTORY — PX: CARDIAC CATHETERIZATION: SHX172

## 2015-09-07 SURGERY — LEFT HEART CATH AND CORONARY ANGIOGRAPHY
Anesthesia: LOCAL

## 2015-09-07 MED ORDER — SODIUM CHLORIDE 0.9 % IJ SOLN: 3.0000 mL | INTRAMUSCULAR | Status: DC | PRN

## 2015-09-07 MED ORDER — FENTANYL CITRATE (PF) 100 MCG/2ML IJ SOLN
INTRAMUSCULAR | Status: AC
  Filled 2015-09-07: qty 4

## 2015-09-07 MED ORDER — ASPIRIN 81 MG PO CHEW
CHEWABLE_TABLET | ORAL | Status: AC
  Administered 2015-09-07: 81 mg via ORAL
  Filled 2015-09-07: qty 1

## 2015-09-07 MED ORDER — ASPIRIN 81 MG PO CHEW
81.0000 mg | CHEWABLE_TABLET | ORAL | Status: AC
  Administered 2015-09-07: 81 mg via ORAL

## 2015-09-07 MED ORDER — SODIUM CHLORIDE 0.9 % IJ SOLN: 3.0000 mL | Freq: Two times a day (BID) | INTRAMUSCULAR | Status: DC

## 2015-09-07 MED ORDER — LIDOCAINE HCL (PF) 1 % IJ SOLN
INTRAMUSCULAR | Status: AC
  Filled 2015-09-07: qty 30

## 2015-09-07 MED ORDER — NITROGLYCERIN 0.4 MG SL SUBL: 0.4000 mg | SUBLINGUAL_TABLET | SUBLINGUAL | Status: DC | PRN

## 2015-09-07 MED ORDER — SODIUM CHLORIDE 0.9 % WEIGHT BASED INFUSION: 3.0000 mL/kg/h | INTRAVENOUS | Status: AC

## 2015-09-07 MED ORDER — NITROGLYCERIN 1 MG/10 ML FOR IR/CATH LAB
INTRA_ARTERIAL | Status: AC
  Filled 2015-09-07: qty 10

## 2015-09-07 MED ORDER — MIDAZOLAM HCL 2 MG/2ML IJ SOLN
INTRAMUSCULAR | Status: DC | PRN
  Administered 2015-09-07 (×2): 1 mg via INTRAVENOUS

## 2015-09-07 MED ORDER — SODIUM CHLORIDE 0.9 % WEIGHT BASED INFUSION: 1.0000 mL/kg/h | INTRAVENOUS | Status: DC

## 2015-09-07 MED ORDER — IOHEXOL 350 MG/ML SOLN
INTRAVENOUS | Status: DC | PRN
  Administered 2015-09-07: 55 mL via INTRAVENOUS

## 2015-09-07 MED ORDER — SODIUM CHLORIDE 0.9 % WEIGHT BASED INFUSION
3.0000 mL/kg/h | INTRAVENOUS | Status: AC
Start: 2015-09-07 — End: 2015-09-07
  Administered 2015-09-07: 3 mL/kg/h via INTRAVENOUS

## 2015-09-07 MED ORDER — SODIUM CHLORIDE 0.9 % IV SOLN: 250.0000 mL | INTRAVENOUS | Status: DC | PRN

## 2015-09-07 MED ORDER — FENTANYL CITRATE (PF) 100 MCG/2ML IJ SOLN
INTRAMUSCULAR | Status: DC | PRN
  Administered 2015-09-07 (×2): 25 ug via INTRAVENOUS

## 2015-09-07 MED ORDER — MIDAZOLAM HCL 2 MG/2ML IJ SOLN
INTRAMUSCULAR | Status: AC
  Filled 2015-09-07: qty 4

## 2015-09-07 MED ORDER — HEPARIN (PORCINE) IN NACL 2-0.9 UNIT/ML-% IJ SOLN
INTRAMUSCULAR | Status: AC
Start: 2015-09-07 — End: 2015-09-07
  Filled 2015-09-07: qty 1000

## 2015-09-07 MED ORDER — IOHEXOL 300 MG/ML  SOLN
INTRAMUSCULAR | Status: DC | PRN
  Administered 2015-09-07: 55 mL via INTRAVENOUS

## 2015-09-07 SURGICAL SUPPLY — 10 items
CATH INFINITI 5FR MULTPACK ANG (CATHETERS) ×1 IMPLANT
CATH INFINITI JR4 5F (CATHETERS) ×2 IMPLANT
DEVICE RAD COMP TR BAND LRG (VASCULAR PRODUCTS) ×2 IMPLANT
KIT HEART LEFT (KITS) ×2 IMPLANT
PACK CARDIAC CATHETERIZATION (CUSTOM PROCEDURE TRAY) ×2 IMPLANT
SHEATH PINNACLE 5F 10CM (SHEATH) ×1 IMPLANT
SYR MEDRAD MARK V 150ML (SYRINGE) ×2 IMPLANT
TRANSDUCER W/STOPCOCK (MISCELLANEOUS) ×2 IMPLANT
WIRE EMERALD 3MM-J .035X150CM (WIRE) ×1 IMPLANT
WIRE SAFE-T 1.5MM-J .035X260CM (WIRE) ×2 IMPLANT

## 2015-09-07 NOTE — Discharge Instructions (Signed)

## 2015-09-07 NOTE — H&P (View-Only) (Signed)
HPI Mrs Candace Middleton is referred today by Chari Manning for the evaluation and management of chest discomfort. This occurred about 3 weeks ago while walking in a store with her son. She had severe chest pressure that radiated into both shoulders into her jaw. Her son says she looked pale. She had no other associated symptoms other than some shortness of breath. She has emphysema.  It went away after a few minutes.  She was evaluated in the clinic a couple days later. EKG showed some minimal ST segment changes but overall normal.  She has multiple cardiac risk factors including advancing age, heavy tobacco use since she was a teenager, and LDL 160 lower HDL of 65.   She's had no rest angina. She's had a couple episodes of chest pressure into her arm with minimal activity.  She was started on Lopressor, aspirin and Lipitor.   Past Medical History  Diagnosis Date  . Neuromuscular disorder 2014    RSD - nerve blocks in back for treatment   . Cancer     bladder ca    Current Outpatient Prescriptions  Medication Sig Dispense Refill  . albuterol (PROVENTIL HFA;VENTOLIN HFA) 108 (90 BASE) MCG/ACT inhaler Inhale 2 puffs into the lungs every 4 (four) hours as needed for wheezing (cough, shortness of breath or wheezing.). 3 Inhaler 3  . aspirin EC 81 MG tablet Take 1 tablet (81 mg total) by mouth daily. 30 tablet 3  . atorvastatin (LIPITOR) 20 MG tablet Take 1 tablet (20 mg total) by mouth daily. 30 tablet 3  . oxybutynin (DITROPAN) 5 MG tablet Take 5 mg by mouth 3 (three) times daily.    Marland Kitchen tiotropium (SPIRIVA HANDIHALER) 18 MCG inhalation capsule Place 1 capsule (18 mcg total) into inhaler and inhale Nightly. 90 capsule 3  . tiZANidine (ZANAFLEX) 4 MG tablet Take 4 mg by mouth at bedtime.    . topiramate (TOPAMAX) 100 MG tablet Take 100 mg by mouth 2 (two) times daily.    . metoprolol succinate (TOPROL-XL) 25 MG 24 hr tablet Take 1 tablet (25 mg total) by mouth daily. (Patient not taking:  Reported on 09/01/2015) 30 tablet 3  . [DISCONTINUED] gabapentin (NEURONTIN) 300 MG capsule Take 1 capsule (300 mg total) by mouth 2 (two) times daily. (Patient not taking: Reported on 04/29/2015) 60 capsule 2   No current facility-administered medications for this visit.    Allergies  Allergen Reactions  . Sulfa Antibiotics Anaphylaxis  . Gabapentin Swelling    Patient reports facial swelling    Family History  Problem Relation Age of Onset  . Heart disease Mother   . Thyroid disease Sister   . Diabetes Brother   . Epilepsy Son   . Early death Father   . Pulmonary embolism Father   . Colon cancer Neg Hx     Social History   Social History  . Marital Status: Married    Spouse Name: N/A  . Number of Children: N/A  . Years of Education: N/A   Occupational History  . 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 Smoking status: Current Some Day Smoker -- 0.50 packs/day  . Smokeless tobacco: Never Used  . Alcohol Use: No  . Drug Use: No  . Sexual Activity: Yes   Other Topics Concern  . Not on file   Social History Narrative   Husband is currently undergoing chemotherapy for lung cancer. Son  has recently been diagnosed with optic neuritis and has multi-handicaps.     ROS ALL NEGATIVE EXCEPT THOSE NOTED IN HPI  PE  General Appearance: well developed, well nourished in no acute distress, thin HEENT: symmetrical face, PERRLA, good dentition  Neck: no JVD, thyromegaly, or adenopathy, trachea midline Chest: symmetric without deformity Cardiac: PMI non-displaced, RRR, normal S1, S2, no gallop or murmur Lung: clear to ausculation and percussion Vascular: all pulses full without bruits  Abdominal: nondistended, nontender, good bowel sounds, no HSM, no bruits Extremities: no cyanosis, clubbing or edema, no sign of DVT, no varicosities  Skin: normal color, no rashes Neuro: alert and oriented x 3, non-focal Pysch: normal  affect  EKG EKG reviewed, normal sinus rhythm, no ST segment changes. BMET    Component Value Date/Time   NA 138 04/26/2015 1151   K 4.2 04/26/2015 1151   CL 103 04/26/2015 1151   CO2 24 04/26/2015 1151   GLUCOSE 90 04/26/2015 1151   BUN 10 04/26/2015 1151   CREATININE 0.68 04/26/2015 1151   CREATININE 0.68 08/13/2014 1050   CALCIUM 9.4 04/26/2015 1151   GFRNONAA >90 08/13/2014 1050   GFRAA >90 08/13/2014 1050    Lipid Panel     Component Value Date/Time   CHOL 237* 07/12/2015 1446   TRIG 61 07/12/2015 1446   HDL 65 07/12/2015 1446   CHOLHDL 3.6 07/12/2015 1446   VLDL 12 07/12/2015 1446   LDLCALC 160* 07/12/2015 1446    CBC    Component Value Date/Time   WBC 8.8 04/26/2015 1151   WBC 7.1 04/20/2014 0915   RBC 4.78 04/26/2015 1151   RBC 4.97 04/20/2014 0915   HGB 14.8 04/26/2015 1151   HGB 15.8 04/20/2014 0915   HCT 44.0 04/26/2015 1151   HCT 46.6 04/20/2014 0915   PLT 334 04/26/2015 1151   MCV 92.1 04/26/2015 1151   MCV 93.7 04/20/2014 0915   MCH 31.0 04/26/2015 1151   MCH 31.8* 04/20/2014 0915   MCHC 33.6 04/26/2015 1151   MCHC 33.9 04/20/2014 0915   RDW 13.3 04/26/2015 1151   LYMPHSABS 3.1 04/26/2015 1151   MONOABS 0.9 04/26/2015 1151   EOSABS 0.1 04/26/2015 1151   BASOSABS 0.0 04/26/2015 1151

## 2015-09-07 NOTE — Interval H&P Note (Signed)
Cath Lab Visit (complete for each Cath Lab visit)  Clinical Evaluation Leading to the Procedure:   ACS: No.  Non-ACS:    Anginal Classification: CCS III  Anti-ischemic medical therapy: Minimal Therapy (1 class of medications)  Non-Invasive Test Results: No non-invasive testing performed  Prior CABG: No previous CABG  New onset severe angina    History and Physical Interval Note:  09/07/2015 9:55 AM  Candace Middleton  has presented today for surgery, with the diagnosis of cp  The various methods of treatment have been discussed with the patient and family. After consideration of risks, benefits and other options for treatment, the patient has consented to  Procedure(s): Left Heart Cath and Coronary Angiography (N/A) as a surgical intervention .  The patient's history has been reviewed, patient examined, no change in status, stable for surgery.  I have reviewed the patient's chart and labs.  Questions were answered to the patient's satisfaction.     Doniqua Saxby S.

## 2015-09-08 MED FILL — Heparin Sodium (Porcine) 2 Unit/ML in Sodium Chloride 0.9%: INTRAMUSCULAR | Qty: 500 | Status: AC

## 2015-09-08 MED FILL — Lidocaine HCl Local Preservative Free (PF) Inj 1%: INTRAMUSCULAR | Qty: 30 | Status: AC

## 2015-09-08 MED FILL — Nitroglycerin IV Soln 100 MCG/ML in D5W: INTRA_ARTERIAL | Qty: 10 | Status: AC

## 2015-09-21 ENCOUNTER — Encounter: Payer: Self-pay | Admitting: Internal Medicine

## 2015-09-22 ENCOUNTER — Encounter: Payer: Self-pay | Admitting: Cardiology

## 2015-09-22 ENCOUNTER — Ambulatory Visit: Payer: Self-pay | Attending: Cardiology | Admitting: Cardiology

## 2015-09-22 VITALS — BP 111/70 | HR 59 | Temp 97.9°F | Resp 16 | Ht 60.0 in | Wt 96.6 lb

## 2015-09-22 DIAGNOSIS — E785 Hyperlipidemia, unspecified: Secondary | ICD-10-CM | POA: Insufficient documentation

## 2015-09-22 DIAGNOSIS — F17200 Nicotine dependence, unspecified, uncomplicated: Secondary | ICD-10-CM

## 2015-09-22 DIAGNOSIS — I201 Angina pectoris with documented spasm: Secondary | ICD-10-CM | POA: Insufficient documentation

## 2015-09-22 DIAGNOSIS — Z720 Tobacco use: Secondary | ICD-10-CM | POA: Insufficient documentation

## 2015-09-22 DIAGNOSIS — I2510 Atherosclerotic heart disease of native coronary artery without angina pectoris: Secondary | ICD-10-CM | POA: Insufficient documentation

## 2015-09-22 DIAGNOSIS — I2583 Coronary atherosclerosis due to lipid rich plaque: Secondary | ICD-10-CM

## 2015-09-22 NOTE — Assessment & Plan Note (Signed)
She has nonobstructive plaque by recent cardiac catheterization. She has abnormal coronary vasodilatation to intracoronary nitroglycerin. She smokes and probably has coronary vasospasm. I strongly advised to continue to carry nitroglycerin, to stop smoking, and will get follow-up lipids on atorvastatin which was started in the hospital. I will see her back when necessary.

## 2015-09-22 NOTE — Patient Instructions (Signed)
Thank you for visiting with Dr. Verl Blalock today.  Keep your 6 month follow-up with Primary Care Provider. Schedule appointment for Fasting Lipid Panel ASAP.

## 2015-09-22 NOTE — Progress Notes (Signed)
Mrs Grothaus returns today after having a cardiac cath for exertional angina. She has nonobstructive plaque but no vasodilatation with intracoronary nitroglycerin. She most likely has vasospasm on top of plaque. She also is a heavy smoker. She is cut way back on her cigarettes. She was placed on a statin and beta blocker. Her chest tightness has been better on the beta blocker. She has had no further classic exertional angina.  Her exam today is unchanged.

## 2015-09-22 NOTE — Progress Notes (Signed)
Pt here for follow up for CHF. Pt denies any pain today. Pt reports she still has chest pain spontaneously regardless of what she is doing. Pt describes the chest pain as pressure and tightness. Pt states that after being on her beta-blocker, it seems as if she doesn't have the constant pressure all the time like she use to and does not get SOB quite as often. Pt reports SOB sometimes when she is doing something. Pt denies any wheezing. Pt reports edema in her ankles sometimes and reports her legs feel heavy sometimes. Pt smokes .5 packs of cigarettes/day. Pt has not taken her medications today due to taking them at night.

## 2015-09-23 ENCOUNTER — Ambulatory Visit: Payer: Self-pay | Attending: Internal Medicine

## 2015-09-23 DIAGNOSIS — E785 Hyperlipidemia, unspecified: Secondary | ICD-10-CM

## 2015-09-23 LAB — LIPID PANEL
Cholesterol: 145 mg/dL (ref 125–200)
HDL: 65 mg/dL (ref >=46)
LDL Cholesterol: 72 mg/dL (ref <130)
Total CHOL/HDL Ratio: 2.2 Ratio (ref <=5.0)
Triglycerides: 38 mg/dL (ref <150)
VLDL: 8 mg/dL (ref <30)

## 2015-09-30 ENCOUNTER — Telehealth: Payer: Self-pay | Admitting: *Deleted

## 2015-09-30 ENCOUNTER — Ambulatory Visit: Payer: Self-pay | Attending: Internal Medicine

## 2015-09-30 DIAGNOSIS — Z23 Encounter for immunization: Secondary | ICD-10-CM | POA: Insufficient documentation

## 2015-09-30 DIAGNOSIS — Z0000 Encounter for general adult medical examination without abnormal findings: Secondary | ICD-10-CM

## 2015-09-30 NOTE — Telephone Encounter (Signed)
-----   Message from Renella Cunas, MD sent at 09/30/2015 10:08 AM EDT ----- Excellent numbers. No change in treatment.

## 2015-09-30 NOTE — Progress Notes (Signed)
Pt's here for flu shot. Pt reports no fever or felling sick today.

## 2015-09-30 NOTE — Telephone Encounter (Signed)
Patient returned phone call. Please f/u

## 2015-09-30 NOTE — Telephone Encounter (Signed)
Medical Assistant left message on patient's home and cell voicemail. Voicemail states to give a call back to Liliahna Cudd with [facility] at [phone].  

## 2015-09-30 NOTE — Telephone Encounter (Signed)
Patient here for flu shot, patient verified date of birth. Patient asking for results and recommendations. Patient aware of excellent results and no change in medications.  Patient voices understanding and has no further questions at this time.

## 2015-10-04 ENCOUNTER — Telehealth: Payer: Self-pay

## 2015-10-04 NOTE — Telephone Encounter (Signed)
Patient has been seen by Dr. Verl Blalock with cardiology on 09/22/15.

## 2015-10-04 NOTE — Telephone Encounter (Signed)
-----   Message from Brayton Caves, [place] sent at 08/13/2015  5:51 PM EDT ----- Please call her on Monday and see how she feels. I saw her on Friday for some concerning chest pain. I ordered cardiac enzymes, check the result before calling her. If she is still having CP or has abnormalities in her bloodwork, please alert Dr. Doreene Burke. i gave him a call about her on Friday afternoon.

## 2015-10-05 ENCOUNTER — Ambulatory Visit: Payer: Self-pay | Admitting: Urology

## 2015-10-06 ENCOUNTER — Ambulatory Visit: Payer: Self-pay | Attending: Family Medicine

## 2015-11-03 ENCOUNTER — Encounter: Payer: Self-pay | Admitting: Internal Medicine

## 2015-11-03 ENCOUNTER — Encounter: Payer: Self-pay | Admitting: Cardiovascular Disease

## 2015-11-12 ENCOUNTER — Ambulatory Visit (HOSPITAL_COMMUNITY)
Admission: RE | Admit: 2015-11-12 | Discharge: 2015-11-12 | Disposition: A | Discharge status: Home / Self Care | Payer: Self-pay | Source: Ambulatory Visit | Attending: Internal Medicine | Admitting: Internal Medicine

## 2015-11-12 ENCOUNTER — Encounter: Payer: Self-pay | Admitting: Internal Medicine

## 2015-11-12 ENCOUNTER — Other Ambulatory Visit: Payer: Self-pay

## 2015-11-12 ENCOUNTER — Ambulatory Visit: Payer: Self-pay | Attending: Internal Medicine | Admitting: Internal Medicine

## 2015-11-12 VITALS — BP 124/82 | HR 90 | Temp 98.8°F | Resp 17 | Wt 97.0 lb

## 2015-11-12 DIAGNOSIS — R05 Cough: Secondary | ICD-10-CM | POA: Insufficient documentation

## 2015-11-12 DIAGNOSIS — R062 Wheezing: Secondary | ICD-10-CM | POA: Insufficient documentation

## 2015-11-12 DIAGNOSIS — R059 Cough, unspecified: Secondary | ICD-10-CM

## 2015-11-12 DIAGNOSIS — R918 Other nonspecific abnormal finding of lung field: Secondary | ICD-10-CM | POA: Insufficient documentation

## 2015-11-12 DIAGNOSIS — J069 Acute upper respiratory infection, unspecified: Secondary | ICD-10-CM

## 2015-11-12 DIAGNOSIS — R0602 Shortness of breath: Secondary | ICD-10-CM | POA: Insufficient documentation

## 2015-11-12 MED ORDER — FLUTICASONE PROPIONATE 50 MCG/ACT NA SUSP: 2.0000 | Freq: Every day | NASAL | Status: DC

## 2015-11-12 MED ORDER — AMOXICILLIN-POT CLAVULANATE 875-125 MG PO TABS: 1.0000 | ORAL_TABLET | Freq: Two times a day (BID) | ORAL | Status: DC

## 2015-11-12 NOTE — Progress Notes (Signed)
Patient ID: Candace Middleton, female   DOB: 05-Aug-1961, 54 y.o.   MRN: [id number]  CC:  HPI: Candace Middleton is a 54 y.o. female here today for a follow up visit.  Patient has past medical history of bladder cancer and tobacco use. Patient reports that 2 weeks ago she noticed symptoms of sore throat, otalgia, cough, and chest congestion. She states that now her sore throat and otalgia have improved. Several of her family members have all had the same symptoms. She was told by her husbands oncologist that she needs to get treatment. She denies fevers but has noticed some chills. Now her biggest concern is SOB, cough, and head congestion. No mucous production. She feels lightheaded when she takes a deep breath and "feels like someone has punched me in the face". Has tried Theraflu.   Patient has No headache, No chest pain, No abdominal pain - No Nausea, No new weakness tingling or numbness, No Cough - SOB.  Allergies  Allergen Reactions  . Sulfa Antibiotics Anaphylaxis  . Gabapentin Swelling    Patient reports facial swelling   Past Medical History  Diagnosis Date  . Neuromuscular disorder (HCC) 2014    RSD - nerve blocks in back for treatment   . Cancer [facility])     bladder ca   Current Outpatient Prescriptions on File Prior to Visit  Medication Sig Dispense Refill  . acetaminophen (TYLENOL) 325 MG tablet Take 325 mg by mouth every 6 (six) hours as needed for mild pain or moderate pain.    Marland Kitchen albuterol (PROVENTIL HFA;VENTOLIN HFA) 108 (90 BASE) MCG/ACT inhaler Inhale 2 puffs into the lungs every 4 (four) hours as needed for wheezing (cough, shortness of breath or wheezing.). 3 Inhaler 3  . aspirin EC 81 MG tablet Take 1 tablet (81 mg total) by mouth daily. 30 tablet 3  . atorvastatin (LIPITOR) 20 MG tablet Take 1 tablet (20 mg total) by mouth daily. (Patient taking differently: Take 20 mg by mouth daily at 6 PM. ) 30 tablet 3  . ibuprofen (ADVIL,MOTRIN) 200 MG tablet Take 200 mg by mouth  every 6 (six) hours as needed for headache, mild pain or moderate pain.    . metoprolol succinate (TOPROL-XL) 25 MG 24 hr tablet Take 1 tablet (25 mg total) by mouth daily. (Patient taking differently: Take 25 mg by mouth daily at 6 PM. ) 30 tablet 3  . nitroGLYCERIN (NITROSTAT) 0.4 MG SL tablet Place 1 tablet (0.4 mg total) under the tongue every 5 (five) minutes as needed for chest pain. 25 tablet 12  . oxybutynin (DITROPAN) 5 MG tablet Take 5 mg by mouth 3 (three) times daily.    Marland Kitchen tiotropium (SPIRIVA HANDIHALER) 18 MCG inhalation capsule Place 1 capsule (18 mcg total) into inhaler and inhale Nightly. 90 capsule 3  . tiZANidine (ZANAFLEX) 4 MG tablet Take 4 mg by mouth at bedtime.    . topiramate (TOPAMAX) 100 MG tablet Take 100 mg by mouth 2 (two) times daily.    . [DISCONTINUED] gabapentin (NEURONTIN) 300 MG capsule Take 1 capsule (300 mg total) by mouth 2 (two) times daily. (Patient not taking: Reported on 04/29/2015) 60 capsule 2   No current facility-administered medications on file prior to visit.   Family History  Problem Relation Age of Onset  . Heart disease Mother   . Thyroid disease Sister   . Diabetes Brother   . Epilepsy Son   . Early death Father   . Pulmonary embolism Father   .  Colon cancer Neg Hx    Social History   Social History  . Marital Status: Married    Spouse Name: N/A  . Number of Children: N/A  . Years of Education: N/A   Occupational History  . 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 Smoking status: Current Some Day Smoker -- 0.50 packs/day  . Smokeless tobacco: Never Used  . Alcohol Use: No  . Drug Use: No  . Sexual Activity: Yes   Other Topics Concern  . Not on file   Social History Narrative   Husband is currently undergoing chemotherapy for lung cancer. Son has recently been diagnosed with optic neuritis and has multi-handicaps.     Review of Systems: Other than what is stated in  HPI, all other systems are negative.   Objective:   Filed Vitals:   11/12/15 1425  BP: 124/82  Pulse: 90  Temp: 98.8 F (37.1 C)  Resp: 17    Physical Exam  Constitutional: No distress.  HENT:  Right Ear: External ear normal.  Left Ear: External ear normal.  Mouth/Throat: Oropharynx is clear and moist.  Frontal sinus tenderness  Eyes: Conjunctivae are normal. Right eye exhibits no discharge. Left eye exhibits no discharge.  Cardiovascular: Normal rate, regular rhythm and normal heart sounds.   Pulmonary/Chest: Effort normal and breath sounds normal. She has no wheezes. She exhibits tenderness.  Lymphadenopathy:    She has no cervical adenopathy.  Skin: Skin is warm. She is not diaphoretic.     Lab Results  Component Value Date   WBC 8.0 09/01/2015   HGB 14.3 09/01/2015   HCT 43.2 09/01/2015   MCV 90.0 09/01/2015   PLT 319 09/01/2015   Lab Results  Component Value Date   CREATININE 0.64 09/01/2015   BUN 12 09/01/2015   NA 140 09/01/2015   K 4.9 09/01/2015   CL 106 09/01/2015   CO2 26 09/01/2015    No results found for: HGBA1C Lipid Panel     Component Value Date/Time   CHOL 145 09/23/2015 0905   TRIG 38 09/23/2015 0905   HDL 65 09/23/2015 0905   CHOLHDL 2.2 09/23/2015 0905   VLDL 8 09/23/2015 0905   LDLCALC 72 09/23/2015 0905       Assessment and plan:   Candace Middleton was seen today for cough.  Diagnoses and all orders for this visit:  Cough Related to URI  SOB (shortness of breath) -     DG Chest 2 View; Future  URI (upper respiratory infection) -     amoxicillin-clavulanate (AUGMENTIN) 875-125 MG tablet; Take 1 tablet by mouth 2 (two) times daily. -     fluticasone (FLONASE) 50 MCG/ACT nasal spray; Place 2 sprays into both nostrils daily. Will treat empirically with Augmentin since she has had these symptoms for 2 weeks and no improvement with OTC medications.    Return if symptoms worsen or fail to improve.       Ambrose Finland,  NP-C [facility] [phone] 11/12/2015, 2:55 PM

## 2015-11-12 NOTE — Progress Notes (Signed)
Patient complains of cough congestion and some wheezing that started about Two weeks ago

## 2015-11-16 ENCOUNTER — Telehealth: Payer: Self-pay

## 2015-11-16 NOTE — Telephone Encounter (Signed)
Patient not available Message left on voice mail to return our call

## 2015-11-16 NOTE — Telephone Encounter (Signed)
Returned patient phone call Patient not available Left message on voice mail to return our call 

## 2015-11-16 NOTE — Telephone Encounter (Signed)
-----   Message from Lance Bosch, NP sent at 11/15/2015  2:36 PM EST ----- Chest xray without infection

## 2015-11-16 NOTE — Telephone Encounter (Signed)
Patient returned phone call, please f/u with pt.    °

## 2015-11-17 NOTE — Telephone Encounter (Signed)
Pt returning call, please f/u with pt.

## 2015-11-22 ENCOUNTER — Encounter: Payer: Self-pay | Admitting: Internal Medicine

## 2015-11-22 NOTE — Telephone Encounter (Signed)
Pt. Came in requesting her x-ray results. Pt. Also stated she is not feeling well. Please f/u with pt.

## 2015-11-25 ENCOUNTER — Telehealth: Payer: Self-pay

## 2015-11-25 NOTE — Telephone Encounter (Signed)
Patient returned phone call and is aware of her x ray results Patient states she is still having some coughing and congestion Call transferred to front desk to try to schedule a follow up app

## 2015-11-29 ENCOUNTER — Telehealth: Payer: Self-pay

## 2015-12-21 NOTE — Telephone Encounter (Signed)
error 

## 2016-10-23 ENCOUNTER — Telehealth (INDEPENDENT_AMBULATORY_CARE_PROVIDER_SITE_OTHER): Payer: Self-pay | Admitting: Physical Medicine and Rehabilitation

## 2016-10-25 ENCOUNTER — Encounter (HOSPITAL_COMMUNITY): Payer: Self-pay

## 2016-10-25 ENCOUNTER — Emergency Department (HOSPITAL_COMMUNITY): Payer: Self-pay

## 2016-10-25 ENCOUNTER — Emergency Department (HOSPITAL_COMMUNITY)
Admission: EM | Admit: 2016-10-25 | Discharge: 2016-10-25 | Disposition: A | Discharge status: Home / Self Care | Payer: Self-pay | Attending: Emergency Medicine | Admitting: Emergency Medicine

## 2016-10-25 DIAGNOSIS — J441 Chronic obstructive pulmonary disease with (acute) exacerbation: Secondary | ICD-10-CM | POA: Insufficient documentation

## 2016-10-25 DIAGNOSIS — J40 Bronchitis, not specified as acute or chronic: Secondary | ICD-10-CM | POA: Insufficient documentation

## 2016-10-25 DIAGNOSIS — I2510 Atherosclerotic heart disease of native coronary artery without angina pectoris: Secondary | ICD-10-CM | POA: Insufficient documentation

## 2016-10-25 DIAGNOSIS — F17200 Nicotine dependence, unspecified, uncomplicated: Secondary | ICD-10-CM | POA: Insufficient documentation

## 2016-10-25 DIAGNOSIS — Z7982 Long term (current) use of aspirin: Secondary | ICD-10-CM | POA: Insufficient documentation

## 2016-10-25 DIAGNOSIS — Z8551 Personal history of malignant neoplasm of bladder: Secondary | ICD-10-CM | POA: Insufficient documentation

## 2016-10-25 LAB — CBC
HCT: 45 % (ref 36.0–46.0)
Hemoglobin: 15.6 g/dL — ABNORMAL HIGH (ref 12.0–15.0)
MCH: 31.3 pg (ref 26.0–34.0)
MCHC: 34.7 g/dL (ref 30.0–36.0)
MCV: 90.2 fL (ref 78.0–100.0)
Platelets: 318 10*3/uL (ref 150–400)
RBC: 4.99 MIL/uL (ref 3.87–5.11)
RDW: 13.9 % (ref 11.5–15.5)
WBC: 6.1 10*3/uL (ref 4.0–10.5)

## 2016-10-25 LAB — BASIC METABOLIC PANEL
Anion gap: 7 (ref 5–15)
BUN: 10 mg/dL (ref 6–20)
CO2: 25 mmol/L (ref 22–32)
Calcium: 9.2 mg/dL (ref 8.9–10.3)
Chloride: 106 mmol/L (ref 101–111)
Creatinine, Ser: 0.68 mg/dL (ref 0.44–1.00)
GFR calc Af Amer: >60 mL/min (ref >60)
GFR calc non Af Amer: >60 mL/min (ref >60)
Glucose, Bld: 112 mg/dL — ABNORMAL HIGH (ref 65–99)
Potassium: 4.4 mmol/L (ref 3.5–5.1)
Sodium: 138 mmol/L (ref 135–145)

## 2016-10-25 LAB — I-STAT TROPONIN, ED
Troponin i, poc: 0.01 ng/mL (ref 0.00–0.08)
Troponin i, poc: 0 ng/mL (ref 0.00–0.08)

## 2016-10-25 MED ORDER — ALBUTEROL SULFATE HFA 108 (90 BASE) MCG/ACT IN AERS
2.0000 | INHALATION_SPRAY | Freq: Once | RESPIRATORY_TRACT | Status: AC
  Administered 2016-10-25: 2 via RESPIRATORY_TRACT
  Filled 2016-10-25: qty 6.7

## 2016-10-25 MED ORDER — PREDNISONE 20 MG PO TABS
60.0000 mg | ORAL_TABLET | Freq: Once | ORAL | Status: AC
  Administered 2016-10-25: 60 mg via ORAL
  Filled 2016-10-25: qty 3

## 2016-10-25 MED ORDER — AZITHROMYCIN 250 MG PO TABS: 250.0000 mg | ORAL_TABLET | Freq: Every day | ORAL | 0 refills | Status: DC

## 2016-10-25 MED ORDER — IPRATROPIUM-ALBUTEROL 0.5-2.5 (3) MG/3ML IN SOLN
3.0000 mL | Freq: Once | RESPIRATORY_TRACT | Status: AC
  Administered 2016-10-25: 3 mL via RESPIRATORY_TRACT
  Filled 2016-10-25: qty 3

## 2016-10-25 MED ORDER — PREDNISONE 20 MG PO TABS: 40.0000 mg | ORAL_TABLET | Freq: Every day | ORAL | 0 refills | Status: DC

## 2016-10-25 NOTE — Discharge Instructions (Signed)
Inhaler 2 puffs every 4 hrs for shortness of breath. Take prednisone as prescribed until all gone, next dose tomorrow. Take zithromax as prescribed until all gone. Follow up with family doctor. Return if worsening.

## 2016-10-25 NOTE — ED Notes (Signed)
Pt in xray

## 2016-10-25 NOTE — ED Triage Notes (Signed)
Patient complains of increased cough and congestion with chest heaviness x 3 weeks. States due to financial reasons unable to afford her heart and COPD meds. Speaking complete sentences, NAD

## 2016-10-25 NOTE — ED Provider Notes (Signed)
[place] DEPT Provider Note   CSN: [id number] Arrival date & time: 10/25/16  0854     History   Chief Complaint Chief Complaint  Patient presents with  . Shortness of Breath  . Chest Pain    HPI Candace Middleton is a 55 y.o. female.  HPI Candace Middleton is a 55 y.o. female with history of bladder cancer, RSD, COPD, presents to emergency department complaining of cough, chest pain, shortness of breath. Patient reports persistent and worsening cough over the last 3 weeks. She states in the last several days, she has developed increased chest tightness and shortness of breath. She states she is coughing up thick green sputum. She denies any fever or chills. Denies any abdominal pain, nausea, vomiting, diarrhea. She states she has had some nasal congestion. She currently does not have a primary care doctor and does not take any medications for her COPD. She also reports history of "spasms in my blood vessels of the heart, which required catheterization." She states she is supposed to be taking medications for this as well but is not currently on anything. She denies any treatment for her cough or shortness of breath prior to coming in. She denies any swelling in her legs. No recent surgeries or travel, no recent trauma. No other complaints.  Past Medical History:  Diagnosis Date  . Cancer ([place])    bladder ca  . Neuromuscular disorder ([place]) 2014   RSD - nerve blocks in back for treatment     Patient Active Problem List   Diagnosis Date Noted  . Coronary artery vasospasm ([place]) 09/22/2015  . Precordial pain   . Coronary artery disease 09/01/2015  . Hyperlipidemia LDL goal <100 09/01/2015  . Tobacco use disorder 09/01/2015  . History of bladder cancer 04/26/2015  . Nicotine addiction 06/24/2013  . RSD (reflex sympathetic dystrophy) 06/24/2013  . Complex regional pain syndrome of left lower extremity 06/24/2013    Past Surgical History:  Procedure Laterality Date  .  bladder cancer  2006  . BREAST SURGERY  "long time ago"   lumpectomy   . CARDIAC CATHETERIZATION N/A 09/07/2015   Procedure: Left Heart Cath and Coronary Angiography;  Surgeon: Jettie Booze, MD;  Location: [facility];  Service: Cardiovascular;  Laterality: N/A;  . CESAREAN SECTION  1982    OB History    No data available       Home Medications    Prior to Admission medications   Medication Sig Start Date End Date Taking? Authorizing Provider  Ascorbic Acid (VITAMIN C) 100 MG tablet Take 100 mg by mouth daily.   Yes Historical Provider, MD  aspirin-sod bicarb-citric acid (ALKA-SELTZER) 325 MG TBEF tablet Take 325 mg by mouth every 6 (six) hours as needed (cold symptoms).   Yes Historical Provider, MD  nitroGLYCERIN (NITROSTAT) 0.4 MG SL tablet Place 1 tablet (0.4 mg total) under the tongue every 5 (five) minutes as needed for chest pain. 09/07/15  Yes Jettie Booze, MD  Pseudoeph-CPM-DM-APAP (TYLENOL COLD) 30-2-15-325 MG TABS Take 1 tablet by mouth every 6 (six) hours as needed (pain).   Yes Historical Provider, MD  albuterol (PROVENTIL HFA;VENTOLIN HFA) 108 (90 BASE) MCG/ACT inhaler Inhale 2 puffs into the lungs every 4 (four) hours as needed for wheezing (cough, shortness of breath or wheezing.). Patient not taking: Reported on 10/25/2016 05/19/15   Tresa Garter, MD  amoxicillin-clavulanate (AUGMENTIN) 875-125 MG tablet Take 1 tablet by mouth 2 (two) times daily. Patient not  taking: Reported on 10/25/2016 11/12/15   Lance Bosch, NP  aspirin EC 81 MG tablet Take 1 tablet (81 mg total) by mouth daily. Patient not taking: Reported on 10/25/2016 08/13/15   Brayton Caves, PA-C  atorvastatin (LIPITOR) 20 MG tablet Take 1 tablet (20 mg total) by mouth daily. Patient not taking: Reported on 10/25/2016 08/13/15   Brayton Caves, PA-C  fluticasone [facility]) 50 MCG/ACT nasal spray Place 2 sprays into both nostrils daily. Patient not taking: Reported on 10/25/2016 11/12/15    Lance Bosch, NP  metoprolol succinate (TOPROL-XL) 25 MG 24 hr tablet Take 1 tablet (25 mg total) by mouth daily. Patient not taking: Reported on 10/25/2016 08/13/15   Brayton Caves, PA-C  tiotropium (SPIRIVA HANDIHALER) 18 MCG inhalation capsule Place 1 capsule (18 mcg total) into inhaler and inhale Nightly. Patient not taking: Reported on 10/25/2016 05/19/15   Tresa Garter, MD    Family History Family History  Problem Relation Age of Onset  . Heart disease Mother   . Thyroid disease Sister   . Diabetes Brother   . Epilepsy Son   . Early death Father   . Pulmonary embolism Father   . Colon cancer Neg Hx     Social History Social History  Substance Use Topics  . Smoking status: Current Some Day Smoker    Packs/day: 0.50  . Smokeless tobacco: Never Used  . Alcohol use No     Allergies   Sulfa antibiotics and Gabapentin   Review of Systems Review of Systems  Constitutional: Negative for chills and fever.  Respiratory: Positive for cough, chest tightness and shortness of breath.   Cardiovascular: Positive for chest pain. Negative for palpitations and leg swelling.  Gastrointestinal: Negative for abdominal pain, diarrhea, nausea and vomiting.  Genitourinary: Negative for dysuria and flank pain.  Musculoskeletal: Negative for arthralgias, myalgias, neck pain and neck stiffness.  Skin: Negative for rash.  Neurological: Negative for dizziness, weakness and headaches.  All other systems reviewed and are negative.    Physical Exam Updated Vital Signs BP 135/87 (BP Location: Right Arm)   Pulse 67   Temp 97.7 F (36.5 C) (Oral)   Resp 20   Ht 5' (1.524 m)   Wt 42.2 kg   SpO2 98%   BMI 18.16 kg/m   Physical Exam  Constitutional: She appears well-developed and well-nourished. No distress.  HENT:  Head: Normocephalic.  Right Ear: External ear normal.  Left Ear: External ear normal.  Nose: Nose normal.  Mouth/Throat: Oropharynx is clear and moist.  Eyes:  Conjunctivae are normal.  Neck: Neck supple.  Cardiovascular: Normal rate, regular rhythm and normal heart sounds.   Pulmonary/Chest: No respiratory distress. She has no wheezes. She has no rales.  Prolonged expirations, decreased air movement bilaterally  Abdominal: Soft. Bowel sounds are normal. She exhibits no distension. There is no tenderness. There is no rebound.  Musculoskeletal: She exhibits no edema.  Neurological: She is alert.  Skin: Skin is warm and dry.  Psychiatric: She has a normal mood and affect. Her behavior is normal.  Nursing note and vitals reviewed.    ED Treatments / Results  Labs (all labs ordered are listed, but only abnormal results are displayed) Labs Reviewed  BASIC METABOLIC PANEL - Abnormal; Notable for the following:       Result Value   Glucose, Bld 112 (*)    All other components within normal limits  CBC - Abnormal; Notable for the following:  Hemoglobin 15.6 (*)    All other components within normal limits  I-STAT TROPOININ, ED  Randolm Idol, ED    EKG  EKG Interpretation None       Radiology Dg Chest 2 View  Result Date: 10/25/2016 CLINICAL DATA:  Shortness of breath.  Chest pain. EXAM: CHEST  2 VIEW COMPARISON:  11/12/2015 . FINDINGS: Mediastinum and hilar structures are normal. COPD. Lungs are clear. No pleural effusion or pneumothorax. Heart size normal. Densities noted over the left neck and chest most likely extrinsic to the patient. Thoracolumbar spine scoliosis. IMPRESSION: COPD cannot be excluded. No acute cardiopulmonary disease. Chest is stable from prior exam. Electronically Signed   By: Marcello Moores  Register   On: 10/25/2016 09:46    Procedures Procedures (including critical care time)  Medications Ordered in ED Medications  ipratropium-albuterol (DUONEB) 0.5-2.5 (3) MG/3ML nebulizer solution 3 mL (3 mLs Nebulization Given 10/25/16 1033)  predniSONE (DELTASONE) tablet 60 mg (60 mg Oral Given 10/25/16 1032)    ipratropium-albuterol (DUONEB) 0.5-2.5 (3) MG/3ML nebulizer solution 3 mL (3 mLs Nebulization Given 10/25/16 1325)  albuterol (PROVENTIL HFA;VENTOLIN HFA) 108 (90 Base) MCG/ACT inhaler 2 puff (2 puffs Inhalation Given 10/25/16 1421)     Initial Impression / Assessment and Plan / ED Course  I have reviewed the triage vital signs and the nursing notes.  Pertinent labs & imaging results that were available during my care of the patient were reviewed by me and considered in my medical decision making (see chart for details).  Clinical Course   Pt seen and examined pt with productive cough, sob, chest tightness. Hx of COPD. Will get labs, CXR, predinisone and duoneb ordered.   Patient's chest x-ray and labs unremarkable. She feels better after 2 breathing treatments. Given cough for several weeks with productive sputum, will start on Z-Pak. Home with prednisone burst, inhaler provided, follow-up with primary care doctor. Patient's vital signs are normal type of discharge. She is not hypoxic. No respiratory distress. Stable to go home with close follow-up.  Vitals:   10/25/16 1300 10/25/16 1330 10/25/16 1400 10/25/16 1408  BP: 116/76 106/73 106/59 106/59  Pulse: 76 75 96 82  Resp: '14 12 21 13  '$ Temp:    97.5 F (36.4 C)  TempSrc:    Oral  SpO2: 100% 100% 92% 99%  Weight:      Height:         Final Clinical Impressions(s) / ED Diagnoses   Final diagnoses:  COPD exacerbation ([place])  Bronchitis    New Prescriptions Discharge Medication List as of 10/25/2016  1:59 PM    START taking these medications   Details  azithromycin (ZITHROMAX) 250 MG tablet Take 1 tablet (250 mg total) by mouth daily. Take first 2 tablets together, then 1 every day until finished., Starting Wed 10/25/2016, Print    predniSONE (DELTASONE) 20 MG tablet Take 2 tablets (40 mg total) by mouth daily., Starting Wed 10/25/2016, Print         Jeannett Senior, PA-C 10/25/16 1549    Julianne Rice,  MD 10/27/16 1020

## 2016-11-10 NOTE — Telephone Encounter (Signed)
No action required.

## 2018-01-16 ENCOUNTER — Emergency Department (HOSPITAL_COMMUNITY)
Admission: EM | Admit: 2018-01-16 | Discharge: 2018-01-16 | Disposition: A | Discharge status: Home / Self Care | Payer: Medicare HMO | Attending: Emergency Medicine | Admitting: Emergency Medicine

## 2018-01-16 ENCOUNTER — Emergency Department (HOSPITAL_COMMUNITY): Payer: Medicare HMO

## 2018-01-16 ENCOUNTER — Encounter (HOSPITAL_COMMUNITY): Payer: Self-pay | Admitting: Emergency Medicine

## 2018-01-16 DIAGNOSIS — R600 Localized edema: Secondary | ICD-10-CM | POA: Diagnosis not present

## 2018-01-16 DIAGNOSIS — Z7982 Long term (current) use of aspirin: Secondary | ICD-10-CM | POA: Insufficient documentation

## 2018-01-16 DIAGNOSIS — R072 Precordial pain: Secondary | ICD-10-CM | POA: Diagnosis not present

## 2018-01-16 DIAGNOSIS — F17210 Nicotine dependence, cigarettes, uncomplicated: Secondary | ICD-10-CM | POA: Diagnosis not present

## 2018-01-16 DIAGNOSIS — J441 Chronic obstructive pulmonary disease with (acute) exacerbation: Secondary | ICD-10-CM

## 2018-01-16 DIAGNOSIS — Z8551 Personal history of malignant neoplasm of bladder: Secondary | ICD-10-CM | POA: Insufficient documentation

## 2018-01-16 DIAGNOSIS — Z79899 Other long term (current) drug therapy: Secondary | ICD-10-CM | POA: Insufficient documentation

## 2018-01-16 DIAGNOSIS — I2510 Atherosclerotic heart disease of native coronary artery without angina pectoris: Secondary | ICD-10-CM | POA: Insufficient documentation

## 2018-01-16 DIAGNOSIS — R0602 Shortness of breath: Secondary | ICD-10-CM | POA: Diagnosis present

## 2018-01-16 LAB — CBC
HCT: 48.4 % — ABNORMAL HIGH (ref 36.0–46.0)
Hemoglobin: 16.4 g/dL — ABNORMAL HIGH (ref 12.0–15.0)
MCH: 30.8 pg (ref 26.0–34.0)
MCHC: 33.9 g/dL (ref 30.0–36.0)
MCV: 90.8 fL (ref 78.0–100.0)
Platelets: 360 10*3/uL (ref 150–400)
RBC: 5.33 MIL/uL — ABNORMAL HIGH (ref 3.87–5.11)
RDW: 13.5 % (ref 11.5–15.5)
WBC: 7.3 10*3/uL (ref 4.0–10.5)

## 2018-01-16 LAB — BASIC METABOLIC PANEL
Anion gap: 11 (ref 5–15)
BUN: 10 mg/dL (ref 6–20)
CO2: 23 mmol/L (ref 22–32)
Calcium: 9.6 mg/dL (ref 8.9–10.3)
Chloride: 104 mmol/L (ref 101–111)
Creatinine, Ser: 0.84 mg/dL (ref 0.44–1.00)
GFR calc Af Amer: >60 mL/min (ref >60)
GFR calc non Af Amer: >60 mL/min (ref >60)
Glucose, Bld: 118 mg/dL — ABNORMAL HIGH (ref 65–99)
Potassium: 5.7 mmol/L — ABNORMAL HIGH (ref 3.5–5.1)
Sodium: 138 mmol/L (ref 135–145)

## 2018-01-16 LAB — I-STAT TROPONIN, ED: Troponin i, poc: 0 ng/mL (ref 0.00–0.08)

## 2018-01-16 LAB — D-DIMER, QUANTITATIVE: D-Dimer, Quant: 0.39 ug/mL-FEU (ref 0.00–0.50)

## 2018-01-16 LAB — POTASSIUM: Potassium: 3.9 mmol/L (ref 3.5–5.1)

## 2018-01-16 LAB — I-STAT BETA HCG BLOOD, ED (MC, WL, AP ONLY): I-stat hCG, quantitative: <5 m[IU]/mL (ref <5)

## 2018-01-16 MED ORDER — IPRATROPIUM-ALBUTEROL 0.5-2.5 (3) MG/3ML IN SOLN
3.0000 mL | Freq: Once | RESPIRATORY_TRACT | Status: AC
  Administered 2018-01-16: 3 mL via RESPIRATORY_TRACT
  Filled 2018-01-16: qty 3

## 2018-01-16 MED ORDER — PREDNISONE 10 MG PO TABS: 40.0000 mg | ORAL_TABLET | Freq: Every day | ORAL | 0 refills | Status: DC

## 2018-01-16 MED ORDER — ALBUTEROL SULFATE HFA 108 (90 BASE) MCG/ACT IN AERS
2.0000 | INHALATION_SPRAY | Freq: Four times a day (QID) | RESPIRATORY_TRACT | Status: DC
  Administered 2018-01-16: 2 via RESPIRATORY_TRACT
  Filled 2018-01-16: qty 6.7

## 2018-01-16 MED ORDER — METHYLPREDNISOLONE SODIUM SUCC 125 MG IJ SOLR
125.0000 mg | Freq: Once | INTRAMUSCULAR | Status: AC
  Administered 2018-01-16: 125 mg via INTRAVENOUS
  Filled 2018-01-16: qty 2

## 2018-01-16 NOTE — ED Triage Notes (Signed)
Pt to ER for evaluation of right chest pressure radiating to back worse with inspiration with shortness of breath. States cp onset yesterday at 11 am, shortness of breath x months r/t to COPD. Pt VSS. States husband passed recently and is under a large amount of stress

## 2018-01-16 NOTE — ED Provider Notes (Signed)
[facility] EMERGENCY DEPARTMENT Provider Note   CSN: [id number] Arrival date & time: 01/16/18  6195     History   Chief Complaint Chief Complaint  Patient presents with  . Chest Pain  . Shortness of Breath    HPI ALLONA GONDEK is a 57 y.o. female.  Patient with a known history of COPD.  Patient also states that she has had some swelling to her left leg long-term.  Secondary to old injury.  Patient with a complaint of worsening shortness of breath and she has had substernal chest pain that radiates to her left arm and back area.  Only lasts seconds is been intermittent.  It can go away for hours.  Not brought on by anything.  Patient had been seen by cardiology in the past but it was several years ago.  No known cardiac disease.  Patient currently does not have a primary care provider.  Patient states that it seems to be difficult to move air in and out.      Past Medical History:  Diagnosis Date  . Cancer ([place])    bladder ca  . Neuromuscular disorder ([place]) 2014   RSD - nerve blocks in back for treatment     Patient Active Problem List   Diagnosis Date Noted  . Coronary artery vasospasm ([place]) 09/22/2015  . Precordial pain   . Coronary artery disease 09/01/2015  . Hyperlipidemia LDL goal <100 09/01/2015  . Tobacco use disorder 09/01/2015  . History of bladder cancer 04/26/2015  . Nicotine addiction 06/24/2013  . RSD (reflex sympathetic dystrophy) 06/24/2013  . Complex regional pain syndrome of left lower extremity 06/24/2013    Past Surgical History:  Procedure Laterality Date  . bladder cancer  2006  . BREAST SURGERY  "long time ago"   lumpectomy   . CARDIAC CATHETERIZATION N/A 09/07/2015   Procedure: Left Heart Cath and Coronary Angiography;  Surgeon: Jettie Booze, MD;  Location: [facility];  Service: Cardiovascular;  Laterality: N/A;  . CESAREAN SECTION  1982    OB History    No data available       Home  Medications    Prior to Admission medications   Medication Sig Start Date End Date Taking? Authorizing Provider  albuterol (PROVENTIL HFA;VENTOLIN HFA) 108 (90 BASE) MCG/ACT inhaler Inhale 2 puffs into the lungs every 4 (four) hours as needed for wheezing (cough, shortness of breath or wheezing.). Patient not taking: Reported on 10/25/2016 05/19/15   Tresa Garter, MD  amoxicillin-clavulanate (AUGMENTIN) 875-125 MG tablet Take 1 tablet by mouth 2 (two) times daily. Patient not taking: Reported on 10/25/2016 11/12/15   Lance Bosch, NP  Ascorbic Acid (VITAMIN C) 100 MG tablet Take 100 mg by mouth daily.    [provider]  aspirin EC 81 MG tablet Take 1 tablet (81 mg total) by mouth daily. Patient not taking: Reported on 10/25/2016 08/13/15   Brayton Caves, PA-C  aspirin-sod bicarb-citric acid (ALKA-SELTZER) 325 MG TBEF tablet Take 325 mg by mouth every 6 (six) hours as needed (cold symptoms).    [provider]  atorvastatin (LIPITOR) 20 MG tablet Take 1 tablet (20 mg total) by mouth daily. Patient not taking: Reported on 10/25/2016 08/13/15   Brayton Caves, PA-C  azithromycin (ZITHROMAX) 250 MG tablet Take 1 tablet (250 mg total) by mouth daily. Take first 2 tablets together, then 1 every day until finished. Patient not taking: Reported on 01/16/2018 10/25/16  Kirichenko, Tatyana, PA-C  fluticasone (FLONASE) 50 MCG/ACT nasal spray Place 2 sprays into both nostrils daily. Patient not taking: Reported on 10/25/2016 11/12/15   Lance Bosch, NP  metoprolol succinate (TOPROL-XL) 25 MG 24 hr tablet Take 1 tablet (25 mg total) by mouth daily. Patient not taking: Reported on 10/25/2016 08/13/15   Brayton Caves, PA-C  nitroGLYCERIN (NITROSTAT) 0.4 MG SL tablet Place 1 tablet (0.4 mg total) under the tongue every 5 (five) minutes as needed for chest pain. Patient not taking: Reported on 01/16/2018 09/07/15   Jettie Booze, MD  predniSONE (DELTASONE) 10 MG tablet Take 4  tablets (40 mg total) by mouth daily. 01/16/18   Fredia Sorrow, MD  predniSONE (DELTASONE) 20 MG tablet Take 2 tablets (40 mg total) by mouth daily. Patient not taking: Reported on 01/16/2018 10/25/16   Jeannett Senior, PA-C  Pseudoeph-CPM-DM-APAP (TYLENOL COLD) 30-2-15-325 MG TABS Take 1 tablet by mouth every 6 (six) hours as needed (pain).    [provider]  tiotropium (SPIRIVA HANDIHALER) 18 MCG inhalation capsule Place 1 capsule (18 mcg total) into inhaler and inhale Nightly. Patient not taking: Reported on 01/16/2018 05/19/15   Tresa Garter, MD    Family History Family History  Problem Relation Age of Onset  . Heart disease Mother   . Thyroid disease Sister   . Diabetes Brother   . Epilepsy Son   . Early death Father   . Pulmonary embolism Father   . Colon cancer Neg Hx     Social History Social History   Tobacco Use  . Smoking status: Current Some Day Smoker    Packs/day: 0.50  . Smokeless tobacco: Never Used  Substance Use Topics  . Alcohol use: No  . Drug use: No     Allergies   Sulfa antibiotics and Gabapentin   Review of Systems Review of Systems  Constitutional: Negative for fever.  HENT: Negative for congestion.   Eyes: Negative for redness.  Respiratory: Positive for shortness of breath.   Cardiovascular: Positive for chest pain and leg swelling.  Gastrointestinal: Negative for abdominal pain, nausea and vomiting.  Genitourinary: Negative for dysuria.  Musculoskeletal: Negative for back pain.  Skin: Negative for rash.  Neurological: Negative for syncope and headaches.  Hematological: Does not bruise/bleed easily.  Psychiatric/Behavioral: Negative for confusion.     Physical Exam Updated Vital Signs BP 118/78   Pulse 83   Temp 97.7 F (36.5 C) (Oral)   Resp 18   SpO2 96%   Physical Exam  Constitutional: She is oriented to person, place, and time. She appears well-developed and well-nourished. No distress.  HENT:  Head:  Normocephalic and atraumatic.  Mouth/Throat: Oropharynx is clear and moist.  Eyes: Conjunctivae and EOM are normal. Pupils are equal, round, and reactive to light.  Neck: Neck supple.  Cardiovascular: Normal rate and regular rhythm.  Pulmonary/Chest: Effort normal. She has no wheezes.  Poor air movement bilaterally  Abdominal: Soft. Bowel sounds are normal. There is no tenderness.  Musculoskeletal: Normal range of motion. She exhibits edema.  Slightly increased swelling to left ankle area  Neurological: She is alert and oriented to person, place, and time. No cranial nerve deficit or sensory deficit. She exhibits normal muscle tone. Coordination normal.  Skin: Skin is warm. No rash noted.  Nursing note and vitals reviewed.    ED Treatments / Results  Labs (all labs ordered are listed, but only abnormal results are displayed) Labs Reviewed  BASIC METABOLIC PANEL - Abnormal;  Notable for the following components:      Result Value   Potassium 5.7 (*)    Glucose, Bld 118 (*)    All other components within normal limits  CBC - Abnormal; Notable for the following components:   RBC 5.33 (*)    Hemoglobin 16.4 (*)    HCT 48.4 (*)    All other components within normal limits  POTASSIUM  D-DIMER, QUANTITATIVE (NOT AT [facility])  I-STAT TROPONIN, ED  I-STAT BETA HCG BLOOD, ED ([facility], [facility], [facility] ONLY)    EKG  EKG Interpretation  Date/Time:  Wednesday January 16 2018 09:12:45 EST Ventricular Rate:  74 PR Interval:  144 QRS Duration: 88 QT Interval:  394 QTC Calculation: 437 R Axis:   88 Text Interpretation:  Normal sinus rhythm Normal ECG Confirmed by Fredia Sorrow [phone]) on 01/16/2018 9:47:41 AM       Radiology Dg Chest 2 View  Result Date: 01/16/2018 CLINICAL DATA:  Chest pain EXAM: CHEST  2 VIEW COMPARISON:  10/25/2016 FINDINGS: There is hyperinflation of the lungs compatible with COPD. Heart and mediastinal contours are within normal limits. No focal opacities or effusions. No  acute bony abnormality. IMPRESSION: Hyperinflation/COPD.  No active cardiopulmonary disease. Electronically Signed   By: Rolm Baptise M.D.   On: 01/16/2018 10:15    Procedures Procedures (including critical care time)  Medications Ordered in ED Medications  albuterol (PROVENTIL HFA;VENTOLIN HFA) 108 (90 Base) MCG/ACT inhaler 2 puff (2 puffs Inhalation Given 01/16/18 1319)  ipratropium-albuterol (DUONEB) 0.5-2.5 (3) MG/3ML nebulizer solution 3 mL (3 mLs Nebulization Given 01/16/18 1019)  methylPREDNISolone sodium succinate (SOLU-MEDROL) 125 mg/2 mL injection 125 mg (125 mg Intravenous Given 01/16/18 1055)     Initial Impression / Assessment and Plan / ED Course  I have reviewed the triage vital signs and the nursing notes.  Pertinent labs & imaging results that were available during my care of the patient were reviewed by me and considered in my medical decision making (see chart for details).     Patient with a complaint of intermittent chest pain lasting only seconds main complaint was worsening shortness of breath.  On initial exam patient not moving air well.  But no wheezing.  Patient given albuterol nebulizer treatment and Solu-Medrol.  Patient with much better air movement and she feels better.  Still no wheezing.  Symptoms most likely exacerbation of COPD.  Cardiac workup negative d-dimer negative no concern for pulmonary embolus.  Chest x-ray consistent with COPD.  EKG without any acute changes.  Patient currently does not have primary care doctor so will be given referral to wellness clinic.  Patient given albuterol inhaler here to go and will be continued on prednisone for the next 5 days.  Patient will return for development of any chest pain lasting 15 minutes or longer.  Patient did have some increased swelling to left ankle area.  Patient states that that is chronic.  But d-dimer was also negative.  Final Clinical Impressions(s) / ED Diagnoses   Final diagnoses:  COPD  exacerbation [facility])    ED Discharge Orders        Ordered    predniSONE (DELTASONE) 10 MG tablet  Daily     01/16/18 1334       Fredia Sorrow, MD 01/16/18 1341

## 2018-01-16 NOTE — Discharge Instructions (Signed)
Use the albuterol inhaler 2 puffs every 6 hours at least for the next 7 days then as needed.  Continue the prednisone for the next 5 days.  Make an appointment to follow-up with the wellness clinic.  Return for any new or worse symptoms.  Return for any chest pain that lasted 15 minutes or longer.

## 2018-02-04 ENCOUNTER — Encounter: Payer: Self-pay | Admitting: Family Medicine

## 2018-02-04 ENCOUNTER — Ambulatory Visit (INDEPENDENT_AMBULATORY_CARE_PROVIDER_SITE_OTHER): Payer: Medicare HMO | Admitting: Family Medicine

## 2018-02-04 ENCOUNTER — Other Ambulatory Visit: Payer: Self-pay

## 2018-02-04 VITALS — BP 128/76 | HR 83 | Temp 97.9°F | Ht 60.0 in | Wt 102.2 lb

## 2018-02-04 DIAGNOSIS — F17218 Nicotine dependence, cigarettes, with other nicotine-induced disorders: Secondary | ICD-10-CM

## 2018-02-04 DIAGNOSIS — Z1239 Encounter for other screening for malignant neoplasm of breast: Secondary | ICD-10-CM

## 2018-02-04 DIAGNOSIS — G90522 Complex regional pain syndrome I of left lower limb: Secondary | ICD-10-CM | POA: Diagnosis not present

## 2018-02-04 DIAGNOSIS — Z1231 Encounter for screening mammogram for malignant neoplasm of breast: Secondary | ICD-10-CM

## 2018-02-04 DIAGNOSIS — Z8551 Personal history of malignant neoplasm of bladder: Secondary | ICD-10-CM | POA: Diagnosis not present

## 2018-02-04 DIAGNOSIS — E785 Hyperlipidemia, unspecified: Secondary | ICD-10-CM

## 2018-02-04 DIAGNOSIS — F331 Major depressive disorder, recurrent, moderate: Secondary | ICD-10-CM

## 2018-02-04 DIAGNOSIS — I201 Angina pectoris with documented spasm: Secondary | ICD-10-CM | POA: Diagnosis not present

## 2018-02-04 DIAGNOSIS — J42 Unspecified chronic bronchitis: Secondary | ICD-10-CM | POA: Diagnosis not present

## 2018-02-04 DIAGNOSIS — Z1159 Encounter for screening for other viral diseases: Secondary | ICD-10-CM

## 2018-02-04 MED ORDER — ATORVASTATIN CALCIUM 20 MG PO TABS: 20.0000 mg | ORAL_TABLET | Freq: Every day | ORAL | 3 refills | Status: DC

## 2018-02-04 MED ORDER — DULOXETINE HCL 30 MG PO CPEP: 30.0000 mg | ORAL_CAPSULE | Freq: Every day | ORAL | 3 refills | Status: DC

## 2018-02-04 MED ORDER — TIOTROPIUM BROMIDE MONOHYDRATE 18 MCG IN CAPS: 18.0000 ug | ORAL_CAPSULE | Freq: Every day | RESPIRATORY_TRACT | 12 refills | Status: DC

## 2018-02-04 MED ORDER — ALBUTEROL SULFATE 1.25 MG/3ML IN NEBU: 1.0000 | INHALATION_SOLUTION | Freq: Four times a day (QID) | RESPIRATORY_TRACT | 12 refills | Status: DC | PRN

## 2018-02-04 NOTE — Patient Instructions (Addendum)
Our fax is [phone] for Dr. Zettie Pho note.   It was a pleasure to see you today! Thank you for choosing [facility] for your primary care. Candace Middleton was seen for new patient.   Our plans for today were:  Start the duloxetine. Increase to 60mg  in 1 week or so if you are not having any side effects.   Restart the aspirin and lipitor.   Please get your mammogram.    You should return to our clinic to see Dr. Lindell Noe in 1 month for mood.   Best,  Dr. Lindell Noe

## 2018-02-04 NOTE — Progress Notes (Signed)
CC: new patient  HPI  Referred by: mom and brother come here, recently regained insurance. Used to have orange card and go to [facility].   Medical history:  COPD not on oxygen, supposed to be PRN albuterol and spiriva. Out of inhalers x >1 year.  -recent ED visit with COPD exacerbation Complex regional pain syndrome - saw Dr. Ernestina Patches at [facility] ortho for this (reports nerve block), didn't get to followup after husband's illness and subsequent death.   -lyrica made her feel badly, facial swelling and rash with gabapentin  -daily, in her entire L leg up into her back  -reports L foot is colder than right always  -occasional swelling in L foot  -turns purple/red occasionally   -hx of L patellar fracture  -tried tizanidine and topomax from ortho, helped some  -no advil, tylenol bc they don't help   -no hx of surgeries Coronary vasospasm - saw cards for this, no CP currently.  Depression - started many years ago, sees a grief counselor at [place]. No hx of meds or SI or HI or other psych diagnoses or hospitalization.  Bladder cancer - hx of surgical removal; BCG treatment; in remission. Going Monday to see Dr. Tresa Moore interstital cystitis - had a cystoscopy for this years ago and found above cancer  Surgical history: hx of LTCS, endometrial ablation in her 74s, cardiac cath, lumpectomy nonmalignant per her report  Social history:  Lives with: adult son with intellectual disability, does not give physical care to him Occupation: no job, on disability from complex regional pain syndrome Tobacco use: smokes cigarettesm started as a teenager; quit for a few years; now about 1ppd Alcohol use: last drink in her 3s Drug use: no drugs  Tried elavil in the past for sleep, no side effects.   ROS: denies CP, endorses some SOB with activity, denies abd pain, endorses constant LLE pain. Denies dysuria or changes in BMs.   CC, SH/smoking status, and VS noted  Objective: BP 128/76 (BP Location:  Right Arm, Patient Position: Sitting, Cuff Size: Normal)   Pulse 83   Temp 97.9 F (36.6 C) (Oral)   Ht 5' (1.524 m)   Wt 102 lb 3.2 oz (46.4 kg)   SpO2 98%   BMI 19.96 kg/m  Gen: NAD, alert, cooperative, and pleasant thin female.  HEENT: NCAT, EOMI, PERRL CV: RRR, no murmur Resp: coarse breath sounds in bases bilaterally, no wheezes, non-labored Ext: No edema, warm. Bilateral LEs with normal strength and sensation.  Neuro: Alert and oriented, Speech clear, No gross deficits  Assessment and plan:  Coronary artery vasospasm Statin indicated in this condition. Restart lipitor 20. Likely will need to uptitrate. Check lipids today. Reviewed guidelines, no clear indicator for BB. Will not restart metoprolol at present.   Complex regional pain syndrome of left lower extremity Hx of nerve blocks but lost insurance. Given pain with concomitant depression, will start duloxetine at 23m. Patient to titrate up in 1-2 weeks if no SEs. Recheck in 1 month.   Nicotine addiction Patient wants to decrease smoking. Will address at next visit given many problems today. Has quit before.   History of bladder cancer Patient without symptoms, has scheduled f/u with urologist on Monday. Asked for them to fax records, pt given fax number.   COPD (chronic obstructive pulmonary disease) (HCC) Likely 2/2 extensive tobacco abuse. Patient wants to discuss cessation at next visit. Refilled spiriva and albuterol.   Depression Patient seeing counselor who suggested meds may be appropriate.  PHQ and GAD scores as below. Start duloxetine for dual benefit of complex pain and depression. No SI. Continue therapy, recheck in 1 month.  GAD 7 : Generalized Anxiety Score 02/04/2018  Nervous, Anxious, on Edge 3  Control/stop worrying 3  Worry too much - different things 3  Trouble relaxing 3  Restless 2  Easily annoyed or irritable 3  Afraid - awful might happen 3  Total GAD 7 Score 20      Depression screen [facility]  2/9 02/04/2018 02/04/2018 08/13/2015 07/12/2015  Decreased Interest _0 Down, Depressed, Hopeless _1 PHQ - 2 Score _2 Altered sleeping 3 - 1 2  Tired, decreased energy 3 - 1 2  Change in appetite 2 - 1 1  Feeling bad or failure about yourself  3 - 1 2  Trouble concentrating 3 - 1 2  Moving slowly or fidgety/restless 2 - 1 1  Suicidal thoughts 0 - 0 0  PHQ-9 Score 22 - 8 12  Difficult doing work/chores Extremely dIfficult - - -      Orders Placed This Encounter  Procedures  . MM Digital Screening    Standing Status:   Future    Standing Expiration Date:   04/05/2019    Order Specific Question:   Reason for Exam (SYMPTOM  OR DIAGNOSIS REQUIRED)    Answer:   screening for breast cancer    Order Specific Question:   Is the patient pregnant?    Answer:   No    Order Specific Question:   Preferred imaging location?    Answer:   [facility]  . CBC with Differential  . CMP14+EGFR  . Lipid panel  . HIV antibody  . Hepatitis C antibody    Meds ordered this encounter  Medications  . DULoxetine (CYMBALTA) 30 MG capsule    Sig: Take 1 capsule (30 mg total) by mouth daily.    Dispense:  60 capsule    Refill:  3  . albuterol (ACCUNEB) 1.25 MG/3ML nebulizer solution    Sig: Take 3 mLs (1.25 mg total) by nebulization every 6 (six) hours as needed for wheezing.    Dispense:  75 mL    Refill:  12  . tiotropium (SPIRIVA) 18 MCG inhalation capsule    Sig: Place 1 capsule (18 mcg total) into inhaler and inhale daily.    Dispense:  30 capsule    Refill:  12  . atorvastatin (LIPITOR) 20 MG tablet    Sig: Take 1 tablet (20 mg total) by mouth daily.    Dispense:  30 tablet    Refill:  3   Health maintenance - ordered HIV and hep C screening. Patient would like to get repeat mammo, sent referral.   Ralene Ok, MD, PGY2 02/05/2018 9:40 AM

## 2018-02-05 ENCOUNTER — Telehealth: Payer: Self-pay | Admitting: Family Medicine

## 2018-02-05 DIAGNOSIS — F32A Depression, unspecified: Secondary | ICD-10-CM | POA: Insufficient documentation

## 2018-02-05 DIAGNOSIS — J449 Chronic obstructive pulmonary disease, unspecified: Secondary | ICD-10-CM | POA: Insufficient documentation

## 2018-02-05 DIAGNOSIS — J441 Chronic obstructive pulmonary disease with (acute) exacerbation: Secondary | ICD-10-CM | POA: Insufficient documentation

## 2018-02-05 DIAGNOSIS — F329 Major depressive disorder, single episode, unspecified: Secondary | ICD-10-CM | POA: Insufficient documentation

## 2018-02-05 LAB — CMP14+EGFR
ALT: 15 IU/L (ref 0–32)
AST: 13 IU/L (ref 0–40)
Albumin/Globulin Ratio: 2.1 (ref 1.2–2.2)
Albumin: 4.5 g/dL (ref 3.5–5.5)
Alkaline Phosphatase: 71 IU/L (ref 39–117)
BUN/Creatinine Ratio: 12 (ref 9–23)
BUN: 9 mg/dL (ref 6–24)
Bilirubin Total: 0.3 mg/dL (ref 0.0–1.2)
CO2: 24 mmol/L (ref 20–29)
Calcium: 10 mg/dL (ref 8.7–10.2)
Chloride: 100 mmol/L (ref 96–106)
Creatinine, Ser: 0.74 mg/dL (ref 0.57–1.00)
GFR calc Af Amer: 103 mL/min/{1.73_m2} (ref >59)
GFR calc non Af Amer: 90 mL/min/{1.73_m2} (ref >59)
Globulin, Total: 2.1 g/dL (ref 1.5–4.5)
Glucose: 85 mg/dL (ref 65–99)
Potassium: 4.8 mmol/L (ref 3.5–5.2)
Sodium: 140 mmol/L (ref 134–144)
Total Protein: 6.6 g/dL (ref 6.0–8.5)

## 2018-02-05 LAB — HEPATITIS C ANTIBODY: Hep C Virus Ab: <0.1 s/co ratio (ref 0.0–0.9)

## 2018-02-05 LAB — CBC WITH DIFFERENTIAL/PLATELET
Basophils Absolute: 0 10*3/uL (ref 0.0–0.2)
Basos: 0 %
EOS (ABSOLUTE): 0.1 10*3/uL (ref 0.0–0.4)
Eos: 1 %
Hematocrit: 46.7 % — ABNORMAL HIGH (ref 34.0–46.6)
Hemoglobin: 15.8 g/dL (ref 11.1–15.9)
Immature Grans (Abs): 0 10*3/uL (ref 0.0–0.1)
Immature Granulocytes: 0 %
Lymphocytes Absolute: 3.1 10*3/uL (ref 0.7–3.1)
Lymphs: 45 %
MCH: 30.7 pg (ref 26.6–33.0)
MCHC: 33.8 g/dL (ref 31.5–35.7)
MCV: 91 fL (ref 79–97)
Monocytes Absolute: 0.8 10*3/uL (ref 0.1–0.9)
Monocytes: 11 %
Neutrophils Absolute: 3.1 10*3/uL (ref 1.4–7.0)
Neutrophils: 43 %
Platelets: 351 10*3/uL (ref 150–379)
RBC: 5.14 x10E6/uL (ref 3.77–5.28)
RDW: 13.9 % (ref 12.3–15.4)
WBC: 7.1 10*3/uL (ref 3.4–10.8)

## 2018-02-05 LAB — LIPID PANEL
Chol/HDL Ratio: 3.9 ratio (ref 0.0–4.4)
Cholesterol, Total: 280 mg/dL — ABNORMAL HIGH (ref 100–199)
HDL: 72 mg/dL (ref >39)
LDL Calculated: 191 mg/dL — ABNORMAL HIGH (ref 0–99)
Triglycerides: 85 mg/dL (ref 0–149)
VLDL Cholesterol Cal: 17 mg/dL (ref 5–40)

## 2018-02-05 LAB — HIV ANTIBODY (ROUTINE TESTING W REFLEX): HIV Screen 4th Generation wRfx: NONREACTIVE

## 2018-02-05 MED ORDER — ALBUTEROL SULFATE HFA 108 (90 BASE) MCG/ACT IN AERS: 2.0000 | INHALATION_SPRAY | Freq: Four times a day (QID) | RESPIRATORY_TRACT | 2 refills | Status: DC | PRN

## 2018-02-05 NOTE — Telephone Encounter (Signed)
Patient given message about inhaler. States she has only ever had neb treatments at hospital and does not have a home nebulizer. That med can be discontinued if agreeable with PCP. Danley Danker, RN [facility] RN)

## 2018-02-05 NOTE — Assessment & Plan Note (Signed)
Hx of nerve blocks but lost insurance. Given pain with concomitant depression, will start duloxetine at 30mg . Patient to titrate up in 1-2 weeks if no SEs. Recheck in 1 month.

## 2018-02-05 NOTE — Assessment & Plan Note (Signed)
Likely 2/2 extensive tobacco abuse. Patient wants to discuss cessation at next visit. Refilled spiriva and albuterol.

## 2018-02-05 NOTE — Assessment & Plan Note (Signed)
Patient wants to decrease smoking. Will address at next visit given many problems today. Has quit before.

## 2018-02-05 NOTE — Assessment & Plan Note (Addendum)
Statin indicated in this condition. Restart lipitor 20. Likely will need to uptitrate. Check lipids today. Reviewed guidelines, no clear indicator for BB. Will not restart metoprolol at present.

## 2018-02-05 NOTE — Telephone Encounter (Signed)
LVM for pt to call the office. If pt calls, please give her the information below. Ottis Stain, CMA

## 2018-02-05 NOTE — Assessment & Plan Note (Signed)
Patient without symptoms, has scheduled f/u with urologist on Monday. Asked for them to fax records, pt given fax number.

## 2018-02-05 NOTE — Telephone Encounter (Signed)
Pt was seen yesterday by Lindell Noe and got her meds refilled. She went to the pharmacy and they told her the nebulizer was filled not her inhaler. Pt only used the nebulizer one time when she went to the ER, but hasn't used since. She wanted the inhaler refilled and it looks like Lindell Noe may have just refilled the wrong one. Pt needs refill for her albuterol/proventil inhaler sent to [facility]. Please advise

## 2018-02-05 NOTE — Telephone Encounter (Signed)
Please let her know I resent the inhaler, and give her my apologies. Thanks, team!

## 2018-02-05 NOTE — Assessment & Plan Note (Signed)
Patient seeing counselor who suggested meds may be appropriate. PHQ and GAD scores as below. Start duloxetine for dual benefit of complex pain and depression. No SI. Continue therapy, recheck in 1 month.  GAD 7 : Generalized Anxiety Score 02/04/2018  Nervous, Anxious, on Edge 3  Control/stop worrying 3  Worry too much - different things 3  Trouble relaxing 3  Restless 2  Easily annoyed or irritable 3  Afraid - awful might happen 3  Total GAD 7 Score 20      Depression screen [facility] 2/9 02/04/2018 02/04/2018 08/13/2015 07/12/2015  Decreased Interest 3 3 1 1   Down, Depressed, Hopeless 3 3 1 1   PHQ - 2 Score 6 6 2 2   Altered sleeping 3 - 1 2  Tired, decreased energy 3 - 1 2  Change in appetite 2 - 1 1  Feeling bad or failure about yourself  3 - 1 2  Trouble concentrating 3 - 1 2  Moving slowly or fidgety/restless 2 - 1 1  Suicidal thoughts 0 - 0 0  PHQ-9 Score 22 - 8 12  Difficult doing work/chores Extremely dIfficult - - -

## 2018-02-11 DIAGNOSIS — R350 Frequency of micturition: Secondary | ICD-10-CM | POA: Diagnosis not present

## 2018-02-11 DIAGNOSIS — C679 Malignant neoplasm of bladder, unspecified: Secondary | ICD-10-CM | POA: Diagnosis not present

## 2018-03-04 ENCOUNTER — Telehealth: Payer: Self-pay

## 2018-03-04 NOTE — Telephone Encounter (Signed)
Pt called, states her rx for duloxetine did not specify to take 2 a day, and she is 6 tablets short- she took 1 tab the first week then increased to 2 tabs QD. Requesting new rx be sent to [facility]. Pt call back [phone] Wallace Cullens, RN

## 2018-03-05 ENCOUNTER — Other Ambulatory Visit: Payer: Self-pay | Admitting: Family Medicine

## 2018-03-05 DIAGNOSIS — R921 Mammographic calcification found on diagnostic imaging of breast: Secondary | ICD-10-CM

## 2018-03-06 MED ORDER — DULOXETINE HCL 30 MG PO CPEP: 60.0000 mg | ORAL_CAPSULE | Freq: Every day | ORAL | 3 refills | Status: DC

## 2018-03-06 NOTE — Telephone Encounter (Signed)
Edited rx and resent to [facility].

## 2018-03-08 ENCOUNTER — Other Ambulatory Visit: Payer: Self-pay

## 2018-03-08 ENCOUNTER — Encounter: Payer: Self-pay | Admitting: Family Medicine

## 2018-03-08 ENCOUNTER — Ambulatory Visit (INDEPENDENT_AMBULATORY_CARE_PROVIDER_SITE_OTHER): Payer: Medicare HMO | Admitting: Family Medicine

## 2018-03-08 VITALS — BP 120/82 | HR 95 | Temp 97.9°F | Ht 60.0 in | Wt 100.2 lb

## 2018-03-08 DIAGNOSIS — F17218 Nicotine dependence, cigarettes, with other nicotine-induced disorders: Secondary | ICD-10-CM

## 2018-03-08 DIAGNOSIS — F411 Generalized anxiety disorder: Secondary | ICD-10-CM | POA: Diagnosis not present

## 2018-03-08 DIAGNOSIS — L820 Inflamed seborrheic keratosis: Secondary | ICD-10-CM | POA: Diagnosis not present

## 2018-03-08 DIAGNOSIS — F331 Major depressive disorder, recurrent, moderate: Secondary | ICD-10-CM | POA: Diagnosis not present

## 2018-03-08 DIAGNOSIS — Z8551 Personal history of malignant neoplasm of bladder: Secondary | ICD-10-CM

## 2018-03-08 DIAGNOSIS — L989 Disorder of the skin and subcutaneous tissue, unspecified: Secondary | ICD-10-CM

## 2018-03-08 MED ORDER — DULOXETINE HCL 30 MG PO CPEP: 60.0000 mg | ORAL_CAPSULE | Freq: Every day | ORAL | 3 refills | Status: DC

## 2018-03-08 NOTE — Progress Notes (Signed)
   CC: f/u mood, leg lesion  HPI Anxiety: Started after her fall in 2014. Basically changed her identity after fall (unable to work anymore) and then husbands cancer and subsequent death. Still seeing her therapist.   Cymbalta didn't help the complex regional pain syndrome. 5-7 pain now.   Mammogram scheduled   Smoking, she will let us know when she is ready   Leg lesion has been there for several weeks, growing.  ROS: denies CP, SOB, abd pain, and dysuria.   CC, SH/smoking status, and VS noted  Objective: BP 120/82   Pulse 95   Temp 97.9 F (36.6 C) (Oral)   Ht 5' (1.524 m)   Wt 100 lb 3.2 oz (45.5 kg)   SpO2 98%   BMI 19.57 kg/m  Gen: NAD, alert, cooperative, and pleasant. HEENT: NCAT, EOMI, PERRL CV: RRR, no murmur Resp: CTAB, no wheezes, non-labored Ext: No edema, warm. L posterior leg 0.5cm lesion, shave biopsied.  Neuro: Alert and oriented, Speech clear, No gross deficits  Shave Biopsy Procedure Note: After written informed consent was obtained, the skin was prepped with alcohol and betadine x3; local anesthesia was obtained with 1% lidocaine with epinephrine. A shave biopsy into the dermis was performed with a razor and hemostasis was achieved with pressure. The biopsy specimen was submitted to pathology in formalin. A dressing was applied. Wound instructions were given. There were no complications. The patient tolerated the procedure well.  Assessment and plan:  Depression PHQ 9 and GAD 7 improved, patient feels much better. Continue cymbalta at current dose. Continue therapy.  History of bladder cancer Recent f/u with urology, cleared to return with PRN concerns.  Nicotine addiction Still not quite ready to quit, encouraged her to reach out when ready.   Leg lesion: biopsied today, will call with path results.   No orders of the defined types were placed in this encounter.   Meds ordered this encounter  Medications  . DULoxetine (CYMBALTA) 30 MG  capsule    Sig: Take 2 capsules (60 mg total) by mouth daily.    Dispense:  60 capsule    Refill:  3     Ralene Ok, MD, PGY2 03/13/2018 8:59 AM

## 2018-03-08 NOTE — Patient Instructions (Signed)
It was a pleasure to see you today! Thank you for choosing [facility] for your primary care. Candace Middleton was seen for mood, skin lesion.   Our plans for today were:  Keep your mood medication the same, so glad it is helping you!   You should return to our clinic to see Dr. Lindell Noe in 6 months for mood, smoking cessation.   Best,  Dr. Lindell Noe

## 2018-03-11 ENCOUNTER — Ambulatory Visit
Admission: RE | Admit: 2018-03-11 | Discharge: 2018-03-11 | Disposition: A | Discharge status: Home / Self Care | Payer: Medicare HMO | Source: Ambulatory Visit | Attending: Family Medicine | Admitting: Family Medicine

## 2018-03-11 DIAGNOSIS — R921 Mammographic calcification found on diagnostic imaging of breast: Secondary | ICD-10-CM | POA: Diagnosis not present

## 2018-03-13 NOTE — Assessment & Plan Note (Signed)
Still not quite ready to quit, encouraged her to reach out when ready.

## 2018-03-13 NOTE — Assessment & Plan Note (Signed)
Recent f/u with urology, cleared to return with PRN concerns.

## 2018-03-13 NOTE — Assessment & Plan Note (Signed)
PHQ 9 and GAD 7 improved, patient feels much better. Continue cymbalta at current dose. Continue therapy.

## 2018-06-06 ENCOUNTER — Other Ambulatory Visit: Payer: Self-pay | Admitting: Family Medicine

## 2018-06-08 ENCOUNTER — Other Ambulatory Visit: Payer: Self-pay | Admitting: Family Medicine

## 2018-07-28 ENCOUNTER — Other Ambulatory Visit: Payer: Self-pay | Admitting: Family Medicine

## 2018-08-28 ENCOUNTER — Other Ambulatory Visit: Payer: Self-pay | Admitting: Family Medicine

## 2018-09-25 ENCOUNTER — Encounter: Payer: Self-pay | Admitting: Family Medicine

## 2018-09-25 ENCOUNTER — Other Ambulatory Visit: Payer: Self-pay

## 2018-09-25 ENCOUNTER — Ambulatory Visit (INDEPENDENT_AMBULATORY_CARE_PROVIDER_SITE_OTHER): Payer: Medicare HMO | Admitting: Family Medicine

## 2018-09-25 VITALS — BP 122/80 | HR 81 | Temp 98.0°F | Wt 103.8 lb

## 2018-09-25 DIAGNOSIS — F331 Major depressive disorder, recurrent, moderate: Secondary | ICD-10-CM

## 2018-09-25 DIAGNOSIS — R5383 Other fatigue: Secondary | ICD-10-CM | POA: Diagnosis not present

## 2018-09-25 DIAGNOSIS — I2583 Coronary atherosclerosis due to lipid rich plaque: Secondary | ICD-10-CM

## 2018-09-25 DIAGNOSIS — Z122 Encounter for screening for malignant neoplasm of respiratory organs: Secondary | ICD-10-CM

## 2018-09-25 DIAGNOSIS — I2510 Atherosclerotic heart disease of native coronary artery without angina pectoris: Secondary | ICD-10-CM

## 2018-09-25 DIAGNOSIS — E785 Hyperlipidemia, unspecified: Secondary | ICD-10-CM

## 2018-09-25 DIAGNOSIS — G90522 Complex regional pain syndrome I of left lower limb: Secondary | ICD-10-CM

## 2018-09-25 DIAGNOSIS — Z23 Encounter for immunization: Secondary | ICD-10-CM | POA: Diagnosis not present

## 2018-09-25 MED ORDER — CYCLOBENZAPRINE HCL 10 MG PO TABS: 10.0000 mg | ORAL_TABLET | Freq: Three times a day (TID) | ORAL | 0 refills | Status: DC | PRN

## 2018-09-25 MED ORDER — DULOXETINE HCL 30 MG PO CPEP: 60.0000 mg | ORAL_CAPSULE | Freq: Every day | ORAL | 4 refills | Status: DC

## 2018-09-25 NOTE — Assessment & Plan Note (Signed)
She accurately assesses that her chronic pain is likely exacerbating this. Continuing outpatient therapy as well. Continue cymbalta and consider adding adjunct therapy in 1 month if no improvement after referral for pain.

## 2018-09-25 NOTE — Progress Notes (Signed)
CC: mood, chronic pain  HPI  Mood - still taking 60mg  cymbalta daily, no changes there. Pain is contributing to her depression and this is keeping her from being able to go to lunch with her friends. Continuing talk therapy at [facility] with Joelene Millin.   Pain is getting worse. Neck tension, hip pain on both sides in her back and then moving around to her hip bones on both sides. Worse pain with sitting and standing. Started in 2014 after a fall and broken knee cap, started with ortho, did PT  And when to Dr. Isaiah Blakes ([place]) who did 13 nerve blocks. And then went to pain management at [facility]. He wanted to put nerve stimulator in her back and she didn't do this bc she was afraid to do it.   At night when she is trying to go to sleep, she sometimes feels like her arms and legs are going to "flip out." a few times per week. Can be sitting in her chair too. Doesn't usually see movements, but feels movements.   Sister and brother have thyroid issues. She feels more tired lately and has put on some weight. Uncle and one of her cousins had thyroid cancer.    Requests Low dose Ct, she understands risks of false positive and unnecessary intervention.   ROS: Denies CP, SOB, abdominal pain, dysuria, changes in BMs.   CC, SH/smoking status, and VS noted  Objective: BP 122/80   Pulse 81   Temp 98 F (36.7 C) (Oral)   Wt 103 lb 12.8 oz (47.1 kg)   SpO2 97%   BMI 20.27 kg/m  Gen: NAD, alert, cooperative, and pleasant. HEENT: NCAT, EOMI, PERRL CV: RRR, no murmur Resp: CTAB, no wheezes, non-labored MSK: TTP over trapezius bilaterally, muscles feel tight, TTP over lumbar region bilaterally, hyperalgesia to L hip and LE with light touch Ext: No edema, warm Neuro: Alert and oriented, Speech clear, No gross deficits  Assessment and plan:  Depression She accurately assesses that her chronic pain is likely exacerbating this. Continuing outpatient therapy as well. Continue cymbalta and consider  adding adjunct therapy in 1 month if no improvement after referral for pain.   Complex regional pain syndrome of left lower extremity Placed referral to our excellent sports medicine team to consider alternative therapy vs further diagnostics - maybe topical nitro patches?   Hyperlipidemia LDL goal <100 Recheck lipids, consider increasing to 40mg .   Neck spasm - trial sparing use of flexeril for this. Counseled to take before bed and avoid driving. Return if no help or worsening.   Requests Low dose Ct, she understands risks of false positive and unnecessary intervention. She smoked for many years, briefly quit, and is now smoking again.   Orders Placed This Encounter  Procedures  . CT CHEST LUNG CA SCREEN LOW DOSE W/O CM    Standing Status:   Future    Standing Expiration Date:   11/26/2019    Order Specific Question:   ** REASON FOR EXAM (FREE TEXT)    Answer:   shared decision making, patient elects for test    Order Specific Question:   Reason for Exam (SYMPTOM  OR DIAGNOSIS REQUIRED)    Answer:   screening for lung cancer    Order Specific Question:   Preferred Imaging Location?    Answer:   [facility]    Order Specific Question:   Is patient pregnant?    Answer:   No    Order Specific Question:  Radiology Contrast Protocol - do NOT remove file path    Answer:   [email or web address]  . Flu Vaccine QUAD 36+ mos IM  . CBC  . Basic metabolic panel  . Lipid panel  . TSH  . Ambulatory referral to Sports Medicine    Referral Priority:   Routine    Referral Type:   Consultation    Number of Visits Requested:   1    Meds ordered this encounter  Medications  . cyclobenzaprine (FLEXERIL) 10 MG tablet    Sig: Take 1 tablet (10 mg total) by mouth 3 (three) times daily as needed for muscle spasms.    Dispense:  30 tablet    Refill:  0  . DULoxetine (CYMBALTA) 30 MG capsule    Sig: Take 2 capsules (60 mg total) by mouth daily.    Dispense:  60  capsule    Refill:  4    Ralene Ok, MD, PGY3 09/25/2018 1:48 PM

## 2018-09-25 NOTE — Assessment & Plan Note (Signed)
Placed referral to our excellent sports medicine team to consider alternative therapy vs further diagnostics - maybe topical nitro patches?

## 2018-09-25 NOTE — Assessment & Plan Note (Signed)
Recheck lipids, consider increasing to 40mg .

## 2018-09-25 NOTE — Patient Instructions (Signed)
It was a pleasure to see you today! Thank you for choosing [facility] for your primary care. Real Cons Kalish was seen for pain, mood.   Our plans for today were:  Let's try the sports medicine office for their input on the pain.   If you would like, we could consider adding another mood medicine next month if you feel pain help doesn't change your mood.   I ordered the CT scan for lung cancer screening.   You should return to our clinic to see Dr. Lindell Noe in 1 month for pain and mood.   Best,  Dr. Lindell Noe

## 2018-09-26 LAB — CBC
Hematocrit: 45.6 % (ref 34.0–46.6)
Hemoglobin: 15.2 g/dL (ref 11.1–15.9)
MCH: 30.2 pg (ref 26.6–33.0)
MCHC: 33.3 g/dL (ref 31.5–35.7)
MCV: 91 fL (ref 79–97)
Platelets: 331 10*3/uL (ref 150–450)
RBC: 5.04 x10E6/uL (ref 3.77–5.28)
RDW: 13 % (ref 12.3–15.4)
WBC: 8.2 10*3/uL (ref 3.4–10.8)

## 2018-09-26 LAB — BASIC METABOLIC PANEL
BUN/Creatinine Ratio: 13 (ref 9–23)
BUN: 9 mg/dL (ref 6–24)
CO2: 25 mmol/L (ref 20–29)
Calcium: 9.7 mg/dL (ref 8.7–10.2)
Chloride: 99 mmol/L (ref 96–106)
Creatinine, Ser: 0.7 mg/dL (ref 0.57–1.00)
GFR calc Af Amer: 110 mL/min/{1.73_m2} (ref >59)
GFR calc non Af Amer: 95 mL/min/{1.73_m2} (ref >59)
Glucose: 96 mg/dL (ref 65–99)
Potassium: 4.4 mmol/L (ref 3.5–5.2)
Sodium: 139 mmol/L (ref 134–144)

## 2018-09-26 LAB — LIPID PANEL
Chol/HDL Ratio: 2.7 ratio (ref 0.0–4.4)
Cholesterol, Total: 181 mg/dL (ref 100–199)
HDL: 68 mg/dL (ref >39)
LDL Calculated: 97 mg/dL (ref 0–99)
Triglycerides: 78 mg/dL (ref 0–149)
VLDL Cholesterol Cal: 16 mg/dL (ref 5–40)

## 2018-09-26 LAB — TSH: TSH: 1.56 u[IU]/mL (ref 0.450–4.500)

## 2018-09-27 ENCOUNTER — Other Ambulatory Visit: Payer: Self-pay | Admitting: Family Medicine

## 2018-09-27 ENCOUNTER — Ambulatory Visit: Payer: Medicare HMO | Admitting: Sports Medicine

## 2018-09-27 ENCOUNTER — Encounter: Payer: Self-pay | Admitting: Sports Medicine

## 2018-09-27 VITALS — BP 122/82 | Ht 60.0 in | Wt 103.0 lb

## 2018-09-27 DIAGNOSIS — G90522 Complex regional pain syndrome I of left lower limb: Secondary | ICD-10-CM

## 2018-09-27 MED ORDER — NORTRIPTYLINE HCL 25 MG PO CAPS: 25.0000 mg | ORAL_CAPSULE | Freq: Three times a day (TID) | ORAL | 1 refills | Status: DC

## 2018-09-27 NOTE — Progress Notes (Signed)
Candace Middleton - 57 y.o. female MRN [id number]  Date of birth: 1961/03/22    SUBJECTIVE:      Chief Complaint:/ HPI:  57 year old female with history of diagnosis of CPRS in the left leg.  Patient reports pain from the knee to the foot.  This pain began in 2014 following an injury which resulted in the patellar fracture.  She spent a significant amount time in a knee immobilizer.  She went through physical therapy after this but continued to have pain.  Initially she reports having some contractures of her toes and flexion.  She was seen by pain management and received 13 nerve blocks in her back.  She was then referred to another provider who did a different type of injection but she is unsure exactly what.  Although these injections did not help her pain, they did relieve some of the contracture in her foot/ankle.  In addition to injections, she has been tried treatment with Lyrica she did not tolerate due to feeling so she was in a fog.  She reports allergy with facial swelling to gabapentin.   ROS:     See HPI  PERTINENT  PMH / PSH FH / / SH:  Past Medical, Surgical, Social, and Family History Reviewed & Updated in the EMR.    OBJECTIVE: BP 122/82   Ht 5' (1.524 m)   Wt 103 lb (46.7 kg)   BMI 20.12 kg/m   Physical Exam:  Vital signs are reviewed.  GEN: Alert and oriented, NAD Pulm: Breathing unlabored PSY: normal mood, congruent affect  MSK: Left lower extremity: -There is mild circumferential swelling at the ankle.  Otherwise no skin changes, bruising, erythema. -Mild diffuse tenderness to palpation along the medial lateral lower leg, ankle, and foot -Full range of motion of the knee.  Decreased plantarflexion and dorsiflexion at the ankle.  Decreased toe flexion - 4+/5 strength in knee flexion and extension.  4+/5 strength in ankle dorsiflexion and plantarflexion and EHL - There is slightly decreased sensation in the leg below the level of the knee.  This is described as  feeling of pins-and-needles.  It is worse in the ankle and foot. -2+ DTR symmetric bilaterally - No collateral or cruciate ligament laxity in the knee.  Negative meniscal testing.  Negative anterior drawer of the ankle.  Right lower extremity - Normal appearance.  No swelling or skin changes - No tenderness -Full range of motion of the knee, ankle and foot -5/5 strength - N/V intact  ASSESSMENT & PLAN:  1.  Complex regional pain syndrome-patient's history and symptoms consistent with diagnosis.  There is no evidence of isolated knee or ankle structural injury. - We will start on nortriptyline 25 mg 3 times daily -Range of motion exercises -Consider compression sleeve - We will obtain some blood work to include BMP, CBC, magnesium, ferritin, vitamin B12 - Patient will follow-up in 2 weeks

## 2018-09-27 NOTE — Telephone Encounter (Signed)
LVM to call office back to be sure that pt was able to pick up her Rx, it looks like this request came in from an old Rx with no refills.  Also contacted pharmacy to ask and they said pt did pick up an Rx and that the request came from an old Rx. Also the Rx that was picked up recently does have refills on it they said. Katharina Caper, April D, [place]

## 2018-09-27 NOTE — Telephone Encounter (Signed)
Please call patient and clarify. I sent this medication w #60 x 4 refills 10/3

## 2018-09-27 NOTE — Patient Instructions (Signed)
Thank you for seeing [place] today for your leg pain due to complex regional pain syndrome. Today we will order some lab work to check for underlying contributing factors.  The lab work with have ordered is a CBC, BMP, magnesium, vitamin B12, and ferritin. In order to attempt to better treat your pain, we will start you on nortriptyline 25 mg.  I want you to take this with once at night for the first week.  After the first week you will increase to 1 pill 3 times per day. I will have you follow-up in about 2 weeks.

## 2018-09-27 NOTE — Telephone Encounter (Signed)
Patient returned call and has already picked up prescription. Refused this one. Danley Danker, RN [facility] RN)

## 2018-09-28 LAB — BASIC METABOLIC PANEL
BUN/Creatinine Ratio: 8 — ABNORMAL LOW (ref 9–23)
BUN: 6 mg/dL (ref 6–24)
CO2: 24 mmol/L (ref 20–29)
Calcium: 9.6 mg/dL (ref 8.7–10.2)
Chloride: 101 mmol/L (ref 96–106)
Creatinine, Ser: 0.71 mg/dL (ref 0.57–1.00)
GFR calc Af Amer: 108 mL/min/{1.73_m2} (ref >59)
GFR calc non Af Amer: 94 mL/min/{1.73_m2} (ref >59)
Glucose: 107 mg/dL — ABNORMAL HIGH (ref 65–99)
Potassium: 4.8 mmol/L (ref 3.5–5.2)
Sodium: 140 mmol/L (ref 134–144)

## 2018-09-28 LAB — MAGNESIUM: Magnesium: 2.2 mg/dL (ref 1.6–2.3)

## 2018-09-28 LAB — CBC
Hematocrit: 46.9 % — ABNORMAL HIGH (ref 34.0–46.6)
Hemoglobin: 15.3 g/dL (ref 11.1–15.9)
MCH: 29.6 pg (ref 26.6–33.0)
MCHC: 32.6 g/dL (ref 31.5–35.7)
MCV: 91 fL (ref 79–97)
Platelets: 307 10*3/uL (ref 150–450)
RBC: 5.17 x10E6/uL (ref 3.77–5.28)
RDW: 13 % (ref 12.3–15.4)
WBC: 6.1 10*3/uL (ref 3.4–10.8)

## 2018-09-28 LAB — VITAMIN B12: Vitamin B-12: 303 pg/mL (ref 232–1245)

## 2018-09-28 LAB — FERRITIN: Ferritin: 192 ng/mL — ABNORMAL HIGH (ref 15–150)

## 2018-09-30 ENCOUNTER — Telehealth: Payer: Self-pay | Admitting: *Deleted

## 2018-09-30 NOTE — Telephone Encounter (Signed)
Pt informed of below. Zimmerman Rumple, Kadee Philyaw D, CMA  

## 2018-09-30 NOTE — Telephone Encounter (Signed)
-----   Message from Sela Hilding, MD sent at 09/30/2018  1:14 PM EDT ----- [facility] team, please let patient know that her cholesterol is much improved and her thyroid function is normal. Sports med office should call her about the results that they ordered as well soon.

## 2018-10-10 ENCOUNTER — Ambulatory Visit: Payer: Medicare HMO | Admitting: Sports Medicine

## 2018-10-17 ENCOUNTER — Encounter: Payer: Self-pay | Admitting: Sports Medicine

## 2018-10-17 ENCOUNTER — Ambulatory Visit (INDEPENDENT_AMBULATORY_CARE_PROVIDER_SITE_OTHER): Payer: Medicare HMO | Admitting: Sports Medicine

## 2018-10-17 VITALS — BP 125/79 | Ht 60.0 in | Wt 103.0 lb

## 2018-10-17 DIAGNOSIS — G90522 Complex regional pain syndrome I of left lower limb: Secondary | ICD-10-CM | POA: Diagnosis not present

## 2018-10-17 NOTE — Patient Instructions (Signed)
I am glad to hear you are tolerating medication well.  As we discussed, we will continue this and have you follow-up in about 4 to 6 weeks. Consider physical therapy if you decide you want to give that a try again. Incorporate the back exercises with a home exercise plan that you currently do. See you back in 4 to 6 weeks

## 2018-10-17 NOTE — Progress Notes (Signed)
Candace Middleton - 57 y.o. female MRN [id number]  Date of birth: 1961/08/27    SUBJECTIVE:      Chief Complaint:/ HPI:  57 year old female presents for follow-up for left lower leg pain.  She has a history of CRPS.  The previous visit she was started on nortriptyline 25 mg 3 times daily.  There was a delay in her picking up her medication from the pharmacy and as a result she is on been on the medication for now for about 1 week.  Overall, her pain has remained stable.  She has tolerated the medication well with no concerning adverse effects.  She denies any new concerning symptoms.   ROS:     See HPI  PERTINENT  PMH / PSH FH / / SH:  Past Medical, Surgical, Social, and Family History Reviewed & Updated in the EMR.    OBJECTIVE: BP 125/79   Ht 5' (1.524 m)   Wt 103 lb (46.7 kg)   BMI 20.12 kg/m   Physical Exam:  Vital signs are reviewed.  GEN: Alert and oriented, NAD Pulm: Breathing unlabored PSY: normal mood, congruent affect  MSK: Left foot/ankle: - Inspection: No obvious deformity.  No swelling there is bluish discoloration of the distal foot. - Palpation: No focal tenderness although mild diffuse tenderness with palpation - Strength: 4/5 strength with ankle dorsiflexion, inversion, and eversion.  5/5 strength with plantarflexion - ROM: Range of motion is decreased in all planes compared to the right - Neuro/vasc: Mild decrease in sensation to light touch with a mild sensation of pins-and-needles reported    ASSESSMENT & PLAN:  1.  CRPS- overall the patient is at baseline.  She is tolerating the nortriptyline well.  She is only been on it for 1 week so we will not make any changes in dosage at this time.  Overall, she understands that the goal of treatment is not to completely cure all her pain, but rather to help reduce the flares she has been having.  She will continue her home exercises.  We will have her follow-up in 4 to 6 weeks.  In the meantime she will consider  whether or not she wants to return to physical therapy.  I was the preceptor available for this visit. Reino Bellis, DO

## 2018-10-29 ENCOUNTER — Other Ambulatory Visit: Payer: Self-pay

## 2018-10-29 ENCOUNTER — Encounter: Payer: Self-pay | Admitting: Family Medicine

## 2018-10-29 ENCOUNTER — Ambulatory Visit (INDEPENDENT_AMBULATORY_CARE_PROVIDER_SITE_OTHER): Payer: Medicare HMO | Admitting: Family Medicine

## 2018-10-29 VITALS — BP 110/60 | HR 92 | Temp 98.1°F | Ht 60.0 in | Wt 108.0 lb

## 2018-10-29 DIAGNOSIS — G90522 Complex regional pain syndrome I of left lower limb: Secondary | ICD-10-CM

## 2018-10-29 DIAGNOSIS — F331 Major depressive disorder, recurrent, moderate: Secondary | ICD-10-CM | POA: Diagnosis not present

## 2018-10-29 DIAGNOSIS — K5900 Constipation, unspecified: Secondary | ICD-10-CM

## 2018-10-29 MED ORDER — DOCUSATE SODIUM 100 MG PO CAPS: 100.0000 mg | ORAL_CAPSULE | Freq: Two times a day (BID) | ORAL | 0 refills | Status: DC

## 2018-10-29 MED ORDER — POLYETHYLENE GLYCOL 3350 17 GM/SCOOP PO POWD: 17.0000 g | Freq: Two times a day (BID) | ORAL | 1 refills | Status: DC | PRN

## 2018-10-29 MED ORDER — TETANUS-DIPHTH-ACELL PERTUSSIS 5-2.5-18.5 LF-MCG/0.5 IM SUSP: 0.5000 mL | Freq: Once | INTRAMUSCULAR | 0 refills | Status: AC

## 2018-10-29 NOTE — Progress Notes (Addendum)
   Chief Concern: f/u mood, chronic pain  HPI [name] is a 57yo F w/Hx of depression, CRPS who is here for 4wk f/u after referral for pain. She was started on Nortriptyline 2wks ago nightly and was increased 1wk ago to TID. Since starting med, she reports the extremes of her pain are decreased and have become more manageable. She also reports her mood has improved as she now wakes up with greater motivation to do things besides get back in bed. She reports decreased BM over the last year, most recently having not had a BM in the past 5 days. She reports occasional mild lower quadrant abd pain which she attributes to the constipation.  ROS: Denies fever, CP, dysuria.   CC, SH/smoking status, and VS noted  Objective: BP 110/60 (BP Location: Right Arm, Patient Position: Sitting, Cuff Size: Normal)   Pulse 92   Temp 98.1 F (36.7 C) (Oral)   Ht 5' (1.524 m)   Wt 108 lb (49 kg)   SpO2 97%   BMI 21.09 kg/m  Gen: NAD, alert, cooperative, and pleasant. CV: RRR, no murmur Resp: CTAB, no wheezes, non-labored Abd: SNTND, BS present, no guarding or organomegaly Neuro: Alert and oriented, Speech clear, No gross deficits  Assessment and plan:  [name] is a 57yo F w/Hx of depression and CRPS recently started on nortriptyline who presents with improved mood and pain and reports continued constipation.   Depression She reports improved mood and motivation. Continue Cymbalta as prescribed.  CRPS She reports decreased extremes in pain. Continue Nortriptyline as prescribed by Sports Medicine.   Constipation She reports continued constipation, currently no BM in 5 days. She has tried Miralax in the past, will retry with increased frequency (BID). Also start taking colace 1 capsule BID. Adjust Miralax dosage as needed, but return to clinic if constipation continues.  No problem-specific Assessment & Plan notes found for this encounter.   No orders of the defined types were placed in  this encounter.   Meds ordered this encounter  Medications  . polyethylene glycol powder (GLYCOLAX/MIRALAX) powder    Sig: Take 17 g by mouth 2 (two) times daily as needed.    Dispense:  3350 g    Refill:  1  . docusate sodium (COLACE) 100 MG capsule    Sig: Take 1 capsule (100 mg total) by mouth 2 (two) times daily.    Dispense:  30 capsule    Refill:  0  . Tdap (BOOSTRIX) 5-2.5-18.5 LF-MCG/0.5 injection    Sig: Inject 0.5 mLs into the muscle once for 1 dose.    Dispense:  0.5 mL    Refill:  0    Health Maintenance reviewed - patient asked to schedule her pap smear, sent prescription for Tetanus booster.  Berlinda Last MS3 10/29/2018 3:13 PM  I examined and evaluated this patient in conjunction with the medical student author, and I agree with their assessment and plan.   Constipation - benign abd exam, titrate miralax to soft daily BMs.   CRS- improvement, we are both pleased, continue SM recs.   Depression - stable, continue current dose.  HM - tdap rx given, return for PAP.   Ralene Ok, MD

## 2018-10-29 NOTE — Patient Instructions (Addendum)
It was a pleasure to see you today! Thank you for choosing [facility] for your primary care. Candace Middleton was seen for pain, mood, constipation.   Our plans for today were:  Your mood seems to be doing well!   Keep up the same dose of the pain medicine, I'm glad this is getting better.   Use the miralax up to twice a day to achieve soft daily BMs. You can use colace as well. Also try to increase the amount of fiber (like leafy and tough vegetables) that you are eating.  The pharmacy should have your tetanus shot ready.   Best,  Dr. Lindell Noe

## 2018-11-28 ENCOUNTER — Encounter: Payer: Self-pay | Admitting: Sports Medicine

## 2018-11-28 ENCOUNTER — Ambulatory Visit: Payer: Medicare HMO | Admitting: Sports Medicine

## 2018-11-28 VITALS — BP 124/73 | Ht 60.0 in | Wt 107.0 lb

## 2018-11-28 DIAGNOSIS — G90522 Complex regional pain syndrome I of left lower limb: Secondary | ICD-10-CM | POA: Diagnosis not present

## 2018-11-28 MED ORDER — TRAMADOL HCL 50 MG PO TABS: 50.0000 mg | ORAL_TABLET | Freq: Three times a day (TID) | ORAL | 0 refills | Status: DC | PRN

## 2018-11-28 NOTE — Patient Instructions (Signed)
Today we refer you to physical therapy to address some of the weakness as well as her back pain. You may use Tylenol 500 mg 2 tablets up to 3 times a day Ibuprofen 400 to 600 mg 3 times a day Tramadol 50 mg every 8 hours for severe pain Follow-up in 6 weeks

## 2018-11-28 NOTE — Progress Notes (Signed)
ITZIA CRAYCRAFT - 57 y.o. female MRN [id number]  Date of birth: Dec 29, 1960    SUBJECTIVE:      Chief Complaint:/ HPI:  57 year old female returns for follow-up for CRPS of the left lower leg.  She has regularly been taking nortriptyline 25 mg 3 times daily.  She feels like this has been very helpful.  Over the last 6 weeks she has had only 3 exacerbations of her pain which she is pleased with.  She does note that when her pain flares she also develops pain throughout the leg and this affects her low back.  Recently, her pain has been particularly worsened due to the cold weather.  Otherwise she denies any new swelling, bruising, erythema.   ROS:     See HPI  PERTINENT  PMH / PSH FH / / SH:  Past Medical, Surgical, Social, and Family History Reviewed & Updated in the EMR.     OBJECTIVE: BP 124/73   Ht 5' (1.524 m)   Wt 107 lb (48.5 kg)   BMI 20.90 kg/m   Physical Exam:  Vital signs are reviewed.  GEN: Alert and oriented, NAD Pulm: Breathing unlabored PSY: normal mood, congruent affect  MSK: Left foot/ankle: - Inspection: mild intoeing of the left foot.  No swelling there is bluish discoloration of the distal foot. - Palpation: No focal tenderness although mild diffuse tenderness with palpation - Strength: 3+/5 eversion, 4/5 plantar flexion, dorsiflexion and inversion.   - ROM: Range of motion is decreased in all planes compared to the right - Neuro/vasc: Mild decrease in sensation to light touch with a mild sensation of pins-and-needles reported  ASSESSMENT & PLAN:  1. CRPS left lower leg/foot: Patient is overall doing very well after starting amitriptyline 25 mg 3 times daily.  We will continue this today at the current dose.  We will also refer her to physical therapy to help address some of the weakness in her ankle and I believe this ultimately caused mechanical issues and affecting her back.  Tylenol and Aleve as needed for pain.  Also give her limited supply of  tramadol for more severe pain so that she is able to fully engage with physical therapy.  Follow-up 6 weeks   I was the preceptor for this visit and was available for immediate consultation. Marsa Aris, DO

## 2018-12-12 ENCOUNTER — Other Ambulatory Visit: Payer: Self-pay

## 2018-12-12 MED ORDER — NORTRIPTYLINE HCL 25 MG PO CAPS: 25.0000 mg | ORAL_CAPSULE | Freq: Three times a day (TID) | ORAL | 2 refills | Status: DC

## 2018-12-16 DIAGNOSIS — M25672 Stiffness of left ankle, not elsewhere classified: Secondary | ICD-10-CM | POA: Diagnosis not present

## 2018-12-16 DIAGNOSIS — R262 Difficulty in walking, not elsewhere classified: Secondary | ICD-10-CM | POA: Diagnosis not present

## 2018-12-16 DIAGNOSIS — G90522 Complex regional pain syndrome I of left lower limb: Secondary | ICD-10-CM | POA: Diagnosis not present

## 2018-12-16 DIAGNOSIS — M6281 Muscle weakness (generalized): Secondary | ICD-10-CM | POA: Diagnosis not present

## 2018-12-23 DIAGNOSIS — G90522 Complex regional pain syndrome I of left lower limb: Secondary | ICD-10-CM | POA: Diagnosis not present

## 2018-12-23 DIAGNOSIS — M6281 Muscle weakness (generalized): Secondary | ICD-10-CM | POA: Diagnosis not present

## 2018-12-23 DIAGNOSIS — M25672 Stiffness of left ankle, not elsewhere classified: Secondary | ICD-10-CM | POA: Diagnosis not present

## 2018-12-23 DIAGNOSIS — R262 Difficulty in walking, not elsewhere classified: Secondary | ICD-10-CM | POA: Diagnosis not present

## 2018-12-27 DIAGNOSIS — M6281 Muscle weakness (generalized): Secondary | ICD-10-CM | POA: Diagnosis not present

## 2018-12-27 DIAGNOSIS — M25672 Stiffness of left ankle, not elsewhere classified: Secondary | ICD-10-CM | POA: Diagnosis not present

## 2018-12-27 DIAGNOSIS — G90522 Complex regional pain syndrome I of left lower limb: Secondary | ICD-10-CM | POA: Diagnosis not present

## 2018-12-27 DIAGNOSIS — R262 Difficulty in walking, not elsewhere classified: Secondary | ICD-10-CM | POA: Diagnosis not present

## 2018-12-30 DIAGNOSIS — M25672 Stiffness of left ankle, not elsewhere classified: Secondary | ICD-10-CM | POA: Diagnosis not present

## 2018-12-30 DIAGNOSIS — R262 Difficulty in walking, not elsewhere classified: Secondary | ICD-10-CM | POA: Diagnosis not present

## 2018-12-30 DIAGNOSIS — G90522 Complex regional pain syndrome I of left lower limb: Secondary | ICD-10-CM | POA: Diagnosis not present

## 2018-12-30 DIAGNOSIS — M6281 Muscle weakness (generalized): Secondary | ICD-10-CM | POA: Diagnosis not present

## 2019-01-01 DIAGNOSIS — G90522 Complex regional pain syndrome I of left lower limb: Secondary | ICD-10-CM | POA: Diagnosis not present

## 2019-01-01 DIAGNOSIS — M6281 Muscle weakness (generalized): Secondary | ICD-10-CM | POA: Diagnosis not present

## 2019-01-01 DIAGNOSIS — M25672 Stiffness of left ankle, not elsewhere classified: Secondary | ICD-10-CM | POA: Diagnosis not present

## 2019-01-01 DIAGNOSIS — R262 Difficulty in walking, not elsewhere classified: Secondary | ICD-10-CM | POA: Diagnosis not present

## 2019-01-06 DIAGNOSIS — M25672 Stiffness of left ankle, not elsewhere classified: Secondary | ICD-10-CM | POA: Diagnosis not present

## 2019-01-06 DIAGNOSIS — G90522 Complex regional pain syndrome I of left lower limb: Secondary | ICD-10-CM | POA: Diagnosis not present

## 2019-01-06 DIAGNOSIS — R262 Difficulty in walking, not elsewhere classified: Secondary | ICD-10-CM | POA: Diagnosis not present

## 2019-01-06 DIAGNOSIS — M6281 Muscle weakness (generalized): Secondary | ICD-10-CM | POA: Diagnosis not present

## 2019-01-08 DIAGNOSIS — R262 Difficulty in walking, not elsewhere classified: Secondary | ICD-10-CM | POA: Diagnosis not present

## 2019-01-08 DIAGNOSIS — G90522 Complex regional pain syndrome I of left lower limb: Secondary | ICD-10-CM | POA: Diagnosis not present

## 2019-01-08 DIAGNOSIS — M25672 Stiffness of left ankle, not elsewhere classified: Secondary | ICD-10-CM | POA: Diagnosis not present

## 2019-01-08 DIAGNOSIS — M6281 Muscle weakness (generalized): Secondary | ICD-10-CM | POA: Diagnosis not present

## 2019-01-13 DIAGNOSIS — R262 Difficulty in walking, not elsewhere classified: Secondary | ICD-10-CM | POA: Diagnosis not present

## 2019-01-13 DIAGNOSIS — M6281 Muscle weakness (generalized): Secondary | ICD-10-CM | POA: Diagnosis not present

## 2019-01-13 DIAGNOSIS — G90522 Complex regional pain syndrome I of left lower limb: Secondary | ICD-10-CM | POA: Diagnosis not present

## 2019-01-13 DIAGNOSIS — M25672 Stiffness of left ankle, not elsewhere classified: Secondary | ICD-10-CM | POA: Diagnosis not present

## 2019-01-15 DIAGNOSIS — M6281 Muscle weakness (generalized): Secondary | ICD-10-CM | POA: Diagnosis not present

## 2019-01-15 DIAGNOSIS — M25672 Stiffness of left ankle, not elsewhere classified: Secondary | ICD-10-CM | POA: Diagnosis not present

## 2019-01-15 DIAGNOSIS — R262 Difficulty in walking, not elsewhere classified: Secondary | ICD-10-CM | POA: Diagnosis not present

## 2019-01-15 DIAGNOSIS — G90522 Complex regional pain syndrome I of left lower limb: Secondary | ICD-10-CM | POA: Diagnosis not present

## 2019-01-20 DIAGNOSIS — M6281 Muscle weakness (generalized): Secondary | ICD-10-CM | POA: Diagnosis not present

## 2019-01-20 DIAGNOSIS — M25672 Stiffness of left ankle, not elsewhere classified: Secondary | ICD-10-CM | POA: Diagnosis not present

## 2019-01-20 DIAGNOSIS — G90522 Complex regional pain syndrome I of left lower limb: Secondary | ICD-10-CM | POA: Diagnosis not present

## 2019-01-20 DIAGNOSIS — R262 Difficulty in walking, not elsewhere classified: Secondary | ICD-10-CM | POA: Diagnosis not present

## 2019-01-22 DIAGNOSIS — G90522 Complex regional pain syndrome I of left lower limb: Secondary | ICD-10-CM | POA: Diagnosis not present

## 2019-01-22 DIAGNOSIS — M6281 Muscle weakness (generalized): Secondary | ICD-10-CM | POA: Diagnosis not present

## 2019-01-22 DIAGNOSIS — M25672 Stiffness of left ankle, not elsewhere classified: Secondary | ICD-10-CM | POA: Diagnosis not present

## 2019-01-22 DIAGNOSIS — R262 Difficulty in walking, not elsewhere classified: Secondary | ICD-10-CM | POA: Diagnosis not present

## 2019-01-25 ENCOUNTER — Other Ambulatory Visit: Payer: Self-pay | Admitting: Family Medicine

## 2019-01-27 DIAGNOSIS — M6281 Muscle weakness (generalized): Secondary | ICD-10-CM | POA: Diagnosis not present

## 2019-01-27 DIAGNOSIS — M25672 Stiffness of left ankle, not elsewhere classified: Secondary | ICD-10-CM | POA: Diagnosis not present

## 2019-01-27 DIAGNOSIS — G90522 Complex regional pain syndrome I of left lower limb: Secondary | ICD-10-CM | POA: Diagnosis not present

## 2019-01-27 DIAGNOSIS — R262 Difficulty in walking, not elsewhere classified: Secondary | ICD-10-CM | POA: Diagnosis not present

## 2019-03-12 ENCOUNTER — Other Ambulatory Visit: Payer: Self-pay | Admitting: Sports Medicine

## 2019-05-12 ENCOUNTER — Encounter: Payer: Self-pay | Admitting: Family Medicine

## 2019-05-12 ENCOUNTER — Ambulatory Visit (INDEPENDENT_AMBULATORY_CARE_PROVIDER_SITE_OTHER): Payer: Medicare HMO | Admitting: Family Medicine

## 2019-05-12 ENCOUNTER — Other Ambulatory Visit: Payer: Self-pay

## 2019-05-12 VITALS — Ht 60.0 in | Wt 110.0 lb

## 2019-05-12 DIAGNOSIS — Z13820 Encounter for screening for osteoporosis: Secondary | ICD-10-CM

## 2019-05-12 DIAGNOSIS — Z122 Encounter for screening for malignant neoplasm of respiratory organs: Secondary | ICD-10-CM

## 2019-05-12 DIAGNOSIS — F331 Major depressive disorder, recurrent, moderate: Secondary | ICD-10-CM

## 2019-05-12 DIAGNOSIS — F17200 Nicotine dependence, unspecified, uncomplicated: Secondary | ICD-10-CM

## 2019-05-12 DIAGNOSIS — Z0000 Encounter for general adult medical examination without abnormal findings: Secondary | ICD-10-CM

## 2019-05-12 NOTE — Progress Notes (Signed)
Subjective:   Candace Middleton is a 58 y.o. female who presents for Medicare Annual (Subsequent) preventive examination.  The patient consented to a virtual visit.  Review of Systems:  Denies CP, SOB, abdominal pain, dysuria, changes in BMs.   Cardiac Risk Factors include: dyslipidemia;smoking/ tobacco exposure     Objective:     Vitals: Ht 5' (1.524 m)   Wt 110 lb (49.9 kg)   BMI 21.48 kg/m   Body mass index is 21.48 kg/m.  Advanced Directives 05/12/2019 10/29/2018 03/08/2018 10/25/2016 09/22/2015 09/07/2015 09/01/2015  Does Patient Have a Medical Advance Directive? Yes No No No Yes Yes Yes  Type of Advance Directive Home;Living will Living will;Healthcare Power of Attorney  Does patient want to make changes to medical advance directive? No - Patient declined - - - - No - Patient declined -  Copy of [place] in Chart? No - copy requested - - - - - -  Would patient like information on creating a medical advance directive? - No - Patient declined No - Patient declined No - patient declined information - No - patient declined information No - patient declined information    Tobacco Social History   Tobacco Use  Smoking Status Current Some Day Smoker  . Packs/day: 0.50  Smokeless Tobacco Never Used     Ready to quit: Yes Counseling given: Yes   Clinical Intake:     Pain : 0-10 Pain Score: 7  Pain Type: Chronic pain, Neuropathic pain Pain Location: Leg Pain Orientation: Left Pain Descriptors / Indicators: Burning Pain Onset: More than a month ago Pain Frequency: Intermittent     Nutritional Status: BMI of 19-24  Normal  How often do you need to have someone help you when you read instructions, pamphlets, or other written materials from your doctor or pharmacy?: 1 - Never What is the last grade level you completed in school?: 12  Interpreter Needed?: No     Past Medical History:   Diagnosis Date  . Allergy   . Anxiety   . Arthritis   . Cancer ([place])    bladder ca  . COPD (chronic obstructive pulmonary disease) ([place])   . Depression   . GERD (gastroesophageal reflux disease)   . Hyperlipidemia   . Neuromuscular disorder ([place]) 2014   RSD - nerve blocks in back for treatment    Past Surgical History:  Procedure Laterality Date  . bladder cancer  2006  . BLADDER TUMOR EXCISION  12/25/2004  . BREAST SURGERY  "long time ago"   lumpectomy   . CARDIAC CATHETERIZATION N/A 09/07/2015   Procedure: Left Heart Cath and Coronary Angiography;  Surgeon: Jettie Booze, MD;  Location: [facility];  Service: Cardiovascular;  Laterality: N/A;  . [place]  . ENDOMETRIAL ABLATION  12/25/1993   Family History  Problem Relation Age of Onset  . Heart disease Mother   . Thyroid disease Sister   . Melanoma Sister   . Diabetes Brother   . Epilepsy Son   . Early death Father   . Pulmonary embolism Father   . Thyroid disease Brother   . Colon cancer Neg Hx    Social History   Socioeconomic History  . Marital status: Married    Spouse name: Not on file  . Number of children: Not on file  . Years of education: Not on file  . Highest education  level: Not on file  Occupational History  . 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 Financial resource strain: Not on file  . Food insecurity:    Worry: Not on file    Inability: Not on file  . Transportation needs:    Medical: Not on file    Non-medical: Not on file  Tobacco Use  . Smoking status: Current Some Day Smoker    Packs/day: 0.50  . Smokeless tobacco: Never Used  Substance and Sexual Activity  . Alcohol use: No  . Drug use: No  . Sexual activity: Yes  Lifestyle  . Physical activity:    Days per week: Not on file    Minutes per session: Not on file  . Stress: Not on file  Relationships  . Social connections:    Talks on  phone: Not on file    Gets together: Not on file    Attends religious service: Not on file    Active member of club or organization: Not on file    Attends meetings of clubs or organizations: Not on file    Relationship status: Not on file  Other Topics Concern  . Not on file  Social History Narrative   Husband is currently undergoing chemotherapy for lung cancer. Son has recently been diagnosed with optic neuritis and has multi-handicaps.     Outpatient Encounter Medications as of 05/12/2019  Medication Sig  . albuterol (PROVENTIL HFA;VENTOLIN HFA) 108 (90 Base) MCG/ACT inhaler Inhale 2 puffs into the lungs every 6 (six) hours as needed for wheezing or shortness of breath.  Marland Kitchen aspirin EC 81 MG tablet Take 1 tablet (81 mg total) by mouth daily.  Marland Kitchen atorvastatin (LIPITOR) 20 MG tablet TAKE 1 TABLET BY MOUTH ONCE DAILY  . docusate sodium (COLACE) 100 MG capsule Take 1 capsule (100 mg total) by mouth 2 (two) times daily.  . DULoxetine (CYMBALTA) 30 MG capsule TAKE 2 CAPSULES(60 MG) BY MOUTH DAILY  . nortriptyline (PAMELOR) 25 MG capsule TAKE ONE CAPSULE( 25 MG TOTAL) BY MOUTH THREE TIMES DAILY.  Marland Kitchen polyethylene glycol powder (GLYCOLAX/MIRALAX) powder Take 17 g by mouth 2 (two) times daily as needed.  . tiotropium (SPIRIVA) 18 MCG inhalation capsule Place 1 capsule (18 mcg total) into inhaler and inhale daily.  . traMADol (ULTRAM) 50 MG tablet Take 1 tablet (50 mg total) by mouth every 8 (eight) hours as needed for severe pain.  . [DISCONTINUED] cyclobenzaprine (FLEXERIL) 10 MG tablet Take 1 tablet (10 mg total) by mouth 3 (three) times daily as needed for muscle spasms.   No facility-administered encounter medications on file as of 05/12/2019.     Activities of Daily Living In your present state of health, do you have any difficulty performing the following activities: 05/12/2019  Hearing? Y  Vision? N  Difficulty concentrating or making decisions? Y  Walking or climbing stairs? Y  Comment  pain  Dressing or bathing? N  Doing errands, shopping? N  Preparing Food and eating ? N  Using the Toilet? N  In the past six months, have you accidently leaked urine? Y  Do you have problems with loss of bowel control? N  Managing your Medications? N  Managing your Finances? N  Housekeeping or managing your Housekeeping? N  Some recent data might be hidden    Patient Care Team: Sela Hilding, MD as PCP - General (Family Medicine) Alexis Frock, MD as Consulting Physician (  Urology) Coralyn Helling, DO as Resident (Sports Medicine)    Assessment:   This is a routine wellness examination for [place].  Exercise Activities and Dietary recommendations Current Exercise Habits: The patient does not participate in regular exercise at present, Exercise limited by: neurologic condition(s);respiratory conditions(s)  Goals    . Increase physical activity (pt-stated)     Wants to be able to walk around several times a week and be able to garden in her yard without leg pain. She also has SOB.     . Quit Smoking (pt-stated)     She is worried about gaining weight. Has quit in the past, last time she quit she weighed up to 140 lbs.        Fall Risk Fall Risk  05/12/2019 03/08/2018 02/04/2018 09/22/2015 09/01/2015  Falls in the past year? 1 No Yes No No  Number falls in past yr: 0 - 1 - -  Injury with Fall? 1 - Yes - -  Risk for fall due to : Impaired balance/gait - - - -  Follow up Falls prevention discussed - - - -   Is the patient's home free of loose throw rugs in walkways, pet beds, electrical cords, etc?   no      Grab bars in the bathroom? yes      Handrails on the stairs?   yes      Adequate lighting?   yes  Patient rating of health (0-10) scale: 7-8   Depression Screen PHQ 2/9 Scores 05/12/2019 10/29/2018 09/25/2018 03/08/2018  PHQ - 2 Score 2 2 4 2   PHQ- 9 Score 8 11 - 11     Cognitive Function     6CIT Screen 05/12/2019  What Year? 0 points  What month? 0 points   What time? 0 points  Count back from 20 0 points  Months in reverse 0 points  Repeat phrase 0 points  Total Score 0    Immunization History  Administered Date(s) Administered  . Influenza,inj,Quad PF,6+ Mos 09/30/2015, 09/25/2018  . Influenza-Unspecified 09/24/2017    Qualifies for Shingles Vaccine? Yes   Screening Tests Health Maintenance  Topic Date Due  . TETANUS/TDAP  02/06/1978  . PAP SMEAR-Modifier  07/11/2018  . INFLUENZA VACCINE  07/26/2019  . MAMMOGRAM  03/11/2020  . COLONOSCOPY  09/05/2023  . Hepatitis C Screening  Completed  . HIV Screening  Completed    Cancer Screenings: Lung: Low Dose CT Chest recommended if Age 25-80 years, 30 pack-year currently smoking OR have quit w/in 15years. Patient does qualify. Breast:  Up to date on Mammogram? No   Up to date of Bone Density/Dexa? No Colorectal: UTD, due 2024  Additional Screenings: : Hepatitis C Screening:  Complete - negative    Plan:  Chronic regional pain syndrome: Patient is really limited in her mobility and exercise due to this.  She is already on 75 mg/day nortriptyline and max dose Cymbalta.  I will research some alternative options for her and will discuss them at her visit in June. She will call back next week to schedule this visit once our June schedule is available.   Current smoker: She was scheduled for a virtual visit with Dr. Valentina Lucks later this week.  Health maintenance: She will call the breast center to schedule DEXA and mammogram.  Tdap: I will have the nurses pull this over from [place].  She did receive it.  Screening for lung cancer: I will reorder the low-dose chest CT.  This was ordered  and never scheduled, she does not think anyone called her.  Depression: This is largely related to her complex regional pain, which will address as above.  I have personally reviewed and noted the following in the patient's chart:   . Medical and social history . Use of alcohol, tobacco or illicit drugs   . Current medications and supplements . Functional ability and status . Nutritional status . Physical activity . Advanced directives . List of other physicians . Hospitalizations, surgeries, and ER visits in previous 12 months . Vitals . Screenings to include cognitive, depression, and falls . Referrals and appointments  In addition, I have reviewed and discussed with patient certain preventive protocols, quality metrics, and best practice recommendations. A written personalized care plan for preventive services as well as general preventive health recommendations were provided to patient.    This visit was conducted virtually in the setting of the [place] pandemic.    Ralene Ok, MD  05/12/2019

## 2019-05-12 NOTE — Patient Instructions (Signed)
Patient declined AVS, we discussed f/u plan for tobacco, HM item, and CRPS.

## 2019-05-15 ENCOUNTER — Other Ambulatory Visit: Payer: Self-pay

## 2019-05-15 ENCOUNTER — Telehealth: Payer: Self-pay | Admitting: Pharmacist

## 2019-05-15 ENCOUNTER — Telehealth: Payer: Medicare HMO | Admitting: Pharmacist

## 2019-05-15 DIAGNOSIS — F17218 Nicotine dependence, cigarettes, with other nicotine-induced disorders: Secondary | ICD-10-CM

## 2019-05-15 DIAGNOSIS — F17200 Nicotine dependence, unspecified, uncomplicated: Secondary | ICD-10-CM

## 2019-05-15 MED ORDER — NICOTINE POLACRILEX 2 MG MT LOZG: 2.0000 mg | LOZENGE | OROMUCOSAL | 2 refills | Status: DC | PRN

## 2019-05-15 MED ORDER — NORTRIPTYLINE HCL 50 MG PO CAPS: 50.0000 mg | ORAL_CAPSULE | Freq: Three times a day (TID) | ORAL | 2 refills | Status: DC

## 2019-05-15 MED ORDER — NICOTINE 14 MG/24HR TD PT24: 14.0000 mg | MEDICATED_PATCH | Freq: Every day | TRANSDERMAL | 2 refills | Status: DC

## 2019-05-15 NOTE — Telephone Encounter (Signed)
S: Patient contacted via telephone.  Verified date of birth.  Shared that phone call was on speaker phone and multiple people were on the line.  Patient verbalized understanding.   Patient visit for: tobacco dependence.    Patient was referred by Dr. Lindell Noe (PCP) on 05/12/2019. She notes that she is unable to afford Spiriva and albuterol, so she hasn't been using these regularly to extend her supply.  Notes that she did quit for ~7 years when she was diagnosed with bladder cancer, but restarted smoking when her husband was diagnosed with lung cancer in 2014. She used patches for a few days, but had significant nightmares.   Age when started using tobacco on a daily basis: started smoking at 39, smoking regularly at 18 Brand smoked Marlboro Uptralights. Number of cigarettes/day 1 ppd. Estimated nicotine content per cigarette (mg) 0.8.  Estimated nicotine intake per day 16mg .   Denies waking to smoke at night.    Expressed concern over weight gain.   Anniversary of husband's death (2017/05/22) is upcoming next week  [place] Score Question Scoring Patient Score  How soon after waking do you smoke your first cigarette? <5 mins (3) 5-30 mins (2) 31-60 mins (1) >60 mins (0) 3  Do you find it difficult NOT to smoke in places where you shouldn't? Yes(1) No (0) 0  Which cigarette would you most hate to give up? First one in AM (1)  Any other one (0)   0 - Patient unsure   How many cigarettes do you smoke/day? 10 or less (0) 11-20 (1) 21-30 (2) >30 (3) 1  Do you smoke more during the first few hours after waking? Yes (1) No (0) 0  Do you smoke if you are so ill you cannot get out of bed? Yes (1) No (0) 0   Total Score   Score interpretation:  low-to-moderate 3-4  Most recent quit attempt: a few years ago Longest time ever been tobacco free 2006 - 2014  Medications used in past cessation efforts include: NRT patches (vivid dreams/nightmares)  Has strength patches in her  possession  Rates IMPORTANCE of quitting tobacco on 1-10 scale of 10 Rates CONFIDENCE of quitting tobacco on 1-10 scale of 4  Most common triggers to use tobacco include: stress, habit  Motivation to quit: health, caring for her disabled son   Clinical ASCVD: No  The 10-year ASCVD risk score Mikey Bussing DC Jr., et al., 2013) is: 5.2%   Values used to calculate the score:     Age: 58 years     Sex: Female     Is Non-Hispanic African American: No     Diabetic: No     Tobacco smoker: Yes     Systolic Blood Pressure: 081 mmHg     Is BP treated: No     HDL Cholesterol: 68 mg/dL     Total Cholesterol: 181 mg/dL   A/P: Tobacco use disorder with moderate nicotine dependence of ~35 years duration in a patient who is fair candidate for success because of current level of commitment to quitting.     -Initiated nicotine replacement tx with Nicotine 14mg  patches  Patient counseled on purpose, proper use, and potential adverse effects, including night mares (patient will take patch OFF at night prior to going to bed).   Also started on: Nicotine lozenges 2mg  8-10 per day.  Reviewed proper use and potential adverse effects.  -Increased nortriptyline from 25mg  to 50mg  each night (starting today).   Patient counseled on  purpose, proper use, and potential adverse effects. -Provided information on 1 800-QUIT NOW support program. Quit date:  05/26/2019  Medication Access:  - Discussed patient and her son's social security income; she is over the income limit for Medicare Extra Help. She will qualify for Spiriva through [place] (no out of pocket requirement) and Proventil through [employer] (no out of pocket requirement). Will prepare applications; will collaborate with PCP Dr. Lindell Noe on provider portions, and will leave patient portion at the clinic front desk for patient to come by and sign. She will also leave copies of proof of income at the clinic. Once all portions are received, will submit to  respective companies.   F/U phone call anticipated on June 2nd (day after quit date). Total time in counseling 32 minutes.  Patient seen with Jackson Latino PharmD, PGY-1 resident. and Catie Darnelle Maffucci, PharmD,  PGY2 Pharmacy Resident.

## 2019-05-15 NOTE — Assessment & Plan Note (Signed)
Tobacco use disorder with moderate nicotine dependence of ~35 years duration in a patient who is fair candidate for success because of current level of commitment to quitting.     -Initiated nicotine replacement tx with Nicotine 14mg  patches  Patient counseled on purpose, proper use, and potential adverse effects, including night mares (patient will take patch OFF at night prior to going to bed).   Also started on: Nicotine lozenges 2mg  8-10 per day.  Reviewed proper use and potential adverse effects.  -Increased nortriptyline from 25mg  to 50mg  each night (starting today).   Patient counseled on purpose, proper use, and potential adverse effects. -Provided information on 1 800-QUIT NOW support program. Quit date:  05/26/2019

## 2019-05-22 ENCOUNTER — Other Ambulatory Visit: Payer: Self-pay | Admitting: Pharmacist

## 2019-05-22 NOTE — Patient Outreach (Signed)
[facility]) Care Management  05/22/2019  SADIE HAZELETT June 11, 1961 [id number]  Referral Reason: Medication Assistance - Spiriva and Proventil Insurance: [employer]   Working with Nicholas Lose, PharmD (see visit from 05/15/2019). Collected patient paperwork, provider paperwork, and proof of income. Faxed application for Spiriva to [employer]. Mailed Proventil application for [employer].   Will pass paperwork along to [employer], CPhT for follow up with the companies.   Catie Darnelle Maffucci, PharmD, [place] PGY2 Ambulatory Care Pharmacy Resident, [place] [facility] Phone: [phone]

## 2019-05-26 ENCOUNTER — Other Ambulatory Visit: Payer: Self-pay | Admitting: Family Medicine

## 2019-05-26 DIAGNOSIS — Z1231 Encounter for screening mammogram for malignant neoplasm of breast: Secondary | ICD-10-CM

## 2019-06-06 ENCOUNTER — Other Ambulatory Visit: Payer: Self-pay | Admitting: Pharmacy Technician

## 2019-06-06 NOTE — Patient Outreach (Signed)
[facility]) Care Management  06/06/2019  JALISHA ENNEKING September 06, 1961 [id number]  ADDENDUM  Unsuccessful outreach call placed to patient in regards to [facility] application for Spiriva and [employer] application for Proventil.  Unfortunately patient did not answer the phone, HIPAA compliant voicemail left.  Was calling patient to update her on the medication assistance applications.  Patient needs to call BI at [phone] as postal service said her address could not be found so her medication was returned to [facility].  Also was calling to update patient that [employer] had mailed out the attestation form so that she could outreach me when that has been received so we could go over it together.  Will followup with patient in 1-2 business days.  Janaiah Vetrano P. Jazyah Butsch, [place] Management [phone]

## 2019-06-06 NOTE — Patient Outreach (Signed)
[facility]) Care Management  06/06/2019  Candace Middleton 01/19/1961 [id number]    Care coordination call placed to [place] in regards to patient's application for Spiriva.  Spoke to [facility] who informed patient had been APPROVED 05/28/2019-12/25/2019. [name] informed that the medication was shipped out on 05/29/2019 but that the postal service had returned to sender saying location of address could not be located. Confirmed address on application was the address we had on file. Address also matched the dirver's license that had been scanned into her file. Informed [name] the information was correct. [name] informed that patient needs to call in to verify address again and that they could possible re ship out the medication.  Care coordination call placed to [employer] in regards to patient's application for Proventil.  Spoke to [place] who informed the application was received and the attestation form was mailed on 06/09/2019.  Will followup with patient with the above information.  Alfons Sulkowski P. Jelan Batterton, [place] Management [phone]

## 2019-06-09 ENCOUNTER — Other Ambulatory Visit: Payer: Self-pay | Admitting: Pharmacy Technician

## 2019-06-09 NOTE — Patient Outreach (Signed)
[facility]) Care Management  06/09/2019  Candace Middleton 09-26-1961 [id number]  ADDENDUM  Incoming call received from patient in regards to [facility] application for Spiriva and [employer] application for [employer] to patient, HIPAA identifiers verified.  Informed patient that BI representative had informed me on Friday that patient's medication had been returned to them.  Patient informed she received the medication on Friday. She informed she received 1 handihaler device and 3x30 count bottle of capsules (90days supply)  to put into the device. Informed patient that I am unsure why they said her medication had been sent back but was grateful that she received it.  Informed patient that [employer] was mailing out the attestation form and for patient to outreach me when she receives it so that we can discuss it together. Patient verbalized understanding.  Will followup with patient in 7-10 business days if patient has not called regarding the attestation letter.  Candace Middleton, [place] Management [phone]

## 2019-06-09 NOTE — Patient Outreach (Signed)
[facility]) Care Management  06/09/2019  KEELEIGH TERRIS May 10, 1961 [id number]  2nd Unsuccessful outreach call placed to patient in regards to [facility] application for Spiriva and [employer] application for Proventil.  Unfortunately patient did not answer the phone, HIPAA compliant voicemail left.  Was calling patient to inform that [employer] has mailed her out the attestation letter and BI needs patient to call in to verify her address for delivery of medication.  Will route note to [place] who is embedded in the clinic for assistance in contacting patient.  Yaira Bernardi P. Ruchi Stoney, [place] Management [phone]

## 2019-06-12 ENCOUNTER — Telehealth: Payer: Self-pay | Admitting: Pharmacist

## 2019-06-12 NOTE — Telephone Encounter (Signed)
Opened in error

## 2019-06-12 NOTE — Telephone Encounter (Signed)
Phone call follow-up for tobacco cessation and Spiriva inhaler  Patient reports abstaining from cigarettes most days and only smoking 5 cigarettes in the last few weeks.   She reported taking nortriptyline 25mg  QAM and at lunch.  She is also taking 50mg  dose (2 pills) at night.  She is also using 14mg  nicotine patch and nicotine gum ~ 3 pieces per day.  She plans to reduce to 7mg  patch in 5 days.    Encouraged continued abstinence.   Also reported receiving supply of Spiriva.    She was grateful for our assistance.  We agreed to follow up with her in 2 weeks.

## 2019-06-12 NOTE — Telephone Encounter (Signed)
-----   Message from De Hollingshead, [facility] sent at 06/12/2019  7:57 AM EDT ----- Received Candace Middleton patient assistance.   We need to call to f/u on smoking cessation

## 2019-06-20 ENCOUNTER — Other Ambulatory Visit: Payer: Self-pay | Admitting: Pharmacy Technician

## 2019-06-20 NOTE — Patient Outreach (Signed)
[facility]) Care Management  06/20/2019  Candace Middleton [birth year] [id number]  Unsuccessful followup call placed to patient in regards to [employer] application for Proventil HFA.  Unfortunately patient did not answer the phone, HIPAA compliant voicemail left.  Was calling patient to inquire if she has received the attestation form that [employer] mailed out on 06/09/2019.  Will followup with patient in 3-7 business days if call is not returned.  Corrin Sieling P. Bertrice Leder, [place] Management [phone]

## 2019-06-20 NOTE — Patient Outreach (Signed)
[facility]) Care Management  06/20/2019  Candace Middleton [birth year] [id number]   ADDENDUM  Incoming call received from patient in regards to [employer] application for Proventil.  Spoke to patient, HIPAA identifiers verified.  Patient was calling to inform that she received the attestation letter. Discussed the form with the patient. Patient verbalized understanding. Patient informed she would place in the mail for pickup tomorrow.   Will followup with [employer] in 7-10 business days to inquire if they received the attestation form and to inquire if they had made a determination.  [name] P. Lexani Corona, [place] Management [phone]

## 2019-06-24 ENCOUNTER — Other Ambulatory Visit: Payer: Self-pay

## 2019-06-26 ENCOUNTER — Telehealth: Payer: Self-pay | Admitting: Pharmacist

## 2019-06-26 NOTE — Telephone Encounter (Signed)
-----   Message from Leavy Cella, [facility] sent at 06/12/2019  1:19 PM EDT ----- Regarding: tobacco cessation f/u tobacco cessation with nortriptyline, patch (7mg ?) and gum

## 2019-06-26 NOTE — Telephone Encounter (Signed)
F/U tobacco cessation  Reports complete abstinence since last contact.    Continues on nortiptyline 25/25/50mg  daily.  Now taking 7mg  patch (supply is limited) and plans for tapering off were discussed.  Use of nicotine gum is minimal - encouraged to use after patch d/c if craving or W/D are significant.   F/U planned via phone in 2 weeks.

## 2019-07-09 ENCOUNTER — Other Ambulatory Visit: Payer: Self-pay | Admitting: Pharmacy Technician

## 2019-07-09 NOTE — Patient Outreach (Signed)
[facility]) Care Management  07/09/2019  NARIAH MORGANO July 26, 1961 [id number]  Care coordination call placed to [employer] patient assistance.  Spoke to [place] who informed they have not received attestation form yet.  Will followup with [employer] in 57-10 business days.  Zorana Brockwell P. Lilyanna Lunt, [place] Management [phone]

## 2019-07-11 ENCOUNTER — Ambulatory Visit
Admission: RE | Admit: 2019-07-11 | Discharge: 2019-07-11 | Disposition: A | Discharge status: Home / Self Care | Payer: Medicare HMO | Source: Ambulatory Visit | Attending: Family Medicine | Admitting: Family Medicine

## 2019-07-11 ENCOUNTER — Other Ambulatory Visit: Payer: Self-pay

## 2019-07-11 DIAGNOSIS — Z1231 Encounter for screening mammogram for malignant neoplasm of breast: Secondary | ICD-10-CM

## 2019-07-11 DIAGNOSIS — M810 Age-related osteoporosis without current pathological fracture: Secondary | ICD-10-CM | POA: Diagnosis not present

## 2019-07-11 DIAGNOSIS — F17200 Nicotine dependence, unspecified, uncomplicated: Secondary | ICD-10-CM

## 2019-07-11 DIAGNOSIS — Z13820 Encounter for screening for osteoporosis: Secondary | ICD-10-CM

## 2019-07-11 DIAGNOSIS — Z780 Asymptomatic menopausal state: Secondary | ICD-10-CM | POA: Diagnosis not present

## 2019-07-14 ENCOUNTER — Other Ambulatory Visit: Payer: Self-pay | Admitting: Pharmacy Technician

## 2019-07-14 NOTE — Patient Outreach (Signed)
[name]) Care Management  07/14/2019  Candace Middleton 1961-11-03 [id number]    Care coordination call placed to [employer] in regards to patient's application for Proventil.  Spoke to [name] who informed patient had been APPROVED 07/06/2019-12/25/2019.  [name] informed the pharmacy has 7-10 days to process the request and another 7-10 days to arrive to patient's home.  Will followup with patient in 14-21 business days to inquire if medication was received.  [name] P. Abbagayle Zaragoza, [place] Management [phone]

## 2019-07-15 ENCOUNTER — Telehealth: Payer: Self-pay | Admitting: Pharmacist

## 2019-07-15 NOTE — Telephone Encounter (Signed)
-----   Message from Leavy Cella, [facility] sent at 06/26/2019  1:39 PM EDT ----- Regarding: tobacco cessation

## 2019-07-15 NOTE — Telephone Encounter (Signed)
F/U on tobacco cessation.  Denies smoking  Patient has stopped patches and is only occasionally using gum.  Reports having only 5 pieces left and believes she will not refill this medication.   Congratulated on success with quitting.    Follow-up plan is by phone in 1 month.

## 2019-07-24 ENCOUNTER — Encounter: Payer: Self-pay | Admitting: Family Medicine

## 2019-07-24 ENCOUNTER — Other Ambulatory Visit: Payer: Self-pay

## 2019-07-24 MED ORDER — DULOXETINE HCL 30 MG PO CPEP: ORAL_CAPSULE | ORAL | 4 refills | Status: DC

## 2019-07-30 ENCOUNTER — Other Ambulatory Visit: Payer: Self-pay | Admitting: Family Medicine

## 2019-08-04 ENCOUNTER — Other Ambulatory Visit: Payer: Self-pay | Admitting: Pharmacy Technician

## 2019-08-04 NOTE — Patient Outreach (Signed)
[facility]) Care Management  08/04/2019  LEONTINE RADMAN [birth year] [id number]   Successful outreach call placed to patient in regards to [employer] application for Proventil HFA.  Spoke to patient, HIPAA identifiers verified.  Patient informed she had received 3 Proventil inhalers. Discussed with patient the refill procedure. Patient verbalized understanding and informed she had saved the package information that contained the phone number to place refills. Informed patient to call when she has either 2 week supply left or 1 inhaler. Patient verbalized understanding.  Informed patient again about the refill procedure for the Spiriva with BI. Again patient informed she saved the information that came with the inhaler that contains the phone number for refills. Patient informed she planned to call sometime next week for her refill. Informed patient to again place refill request when she has 2 week supply or 1 inhaler left. Patient verbalized understanding.  Patient denied having any other questions or concerns. Confirmed patient had name and number.  Will route note to [place] that patient assistance is completed and case can be closed. Will remove myself from care team.  Luiz Ochoa. Charnee Turnipseed, [facility] [phone]

## 2019-08-06 ENCOUNTER — Ambulatory Visit: Payer: Medicare HMO

## 2019-08-14 ENCOUNTER — Telehealth: Payer: Self-pay | Admitting: Pharmacist

## 2019-08-15 NOTE — Telephone Encounter (Signed)
Phone Call follow-up for tobacco cessation.   Patient reports smoking ~ 7 cigarettes over a few days early this month.  States she threw the rest of the pack away and has remained abstinent since.    She had several life stressors in early August and she recognizes that the cigarettes were a coping response.     Counseling provided for > 10 minutes.  She remains OFF of all NRT products at this time.  She continues to be an excellent candidate for long-term success with quitting.    I plan to follow-up again in ~ 5-6 weeks.

## 2019-09-05 ENCOUNTER — Ambulatory Visit (INDEPENDENT_AMBULATORY_CARE_PROVIDER_SITE_OTHER): Payer: Medicare HMO | Admitting: Family Medicine

## 2019-09-05 ENCOUNTER — Encounter: Payer: Self-pay | Admitting: Family Medicine

## 2019-09-05 ENCOUNTER — Other Ambulatory Visit: Payer: Self-pay

## 2019-09-05 ENCOUNTER — Other Ambulatory Visit (HOSPITAL_COMMUNITY)
Admission: RE | Admit: 2019-09-05 | Discharge: 2019-09-05 | Disposition: A | Discharge status: Home / Self Care | Payer: Medicare HMO | Source: Ambulatory Visit | Attending: Family Medicine | Admitting: Family Medicine

## 2019-09-05 VITALS — BP 110/60 | HR 88 | Ht 60.0 in | Wt 108.5 lb

## 2019-09-05 DIAGNOSIS — Z1151 Encounter for screening for human papillomavirus (HPV): Secondary | ICD-10-CM | POA: Diagnosis not present

## 2019-09-05 DIAGNOSIS — F331 Major depressive disorder, recurrent, moderate: Secondary | ICD-10-CM | POA: Diagnosis not present

## 2019-09-05 DIAGNOSIS — M810 Age-related osteoporosis without current pathological fracture: Secondary | ICD-10-CM

## 2019-09-05 DIAGNOSIS — I201 Angina pectoris with documented spasm: Secondary | ICD-10-CM | POA: Diagnosis not present

## 2019-09-05 DIAGNOSIS — J42 Unspecified chronic bronchitis: Secondary | ICD-10-CM

## 2019-09-05 DIAGNOSIS — G90522 Complex regional pain syndrome I of left lower limb: Secondary | ICD-10-CM | POA: Diagnosis not present

## 2019-09-05 DIAGNOSIS — Z124 Encounter for screening for malignant neoplasm of cervix: Secondary | ICD-10-CM | POA: Diagnosis not present

## 2019-09-05 HISTORY — DX: Age-related osteoporosis without current pathological fracture: M81.0

## 2019-09-05 MED ORDER — TRAMADOL-ACETAMINOPHEN 37.5-325 MG PO TABS: 2.0000 | ORAL_TABLET | Freq: Three times a day (TID) | ORAL | 0 refills | Status: DC | PRN

## 2019-09-05 MED ORDER — ALENDRONATE SODIUM 70 MG PO TABS: 70.0000 mg | ORAL_TABLET | ORAL | 5 refills | Status: DC

## 2019-09-05 NOTE — Progress Notes (Addendum)
Subjective:  Candace Middleton is a 58 y.o. female who presents to the [facility] today for a pa smear.   HPI: Patient reports she is doing well and is here for scheduled Pap smear.  Pap smear with cytology was done will need repeat in 5 years.  Complex regional pain syndrome Patient reports that her pain is not relieved by previously prescribed medications.  She is requesting something more to help with the pain that starts in her lower back and radiates around her left side.  She says that she was prescribed tramadol last December and try to take it but it provided minimal relief.  Reports that she occasionally takes ibuprofen or Tylenol to help with the pain but it does not provide much relief either.  Patient reports she takes her Cymbalta as well as nortriptyline.  She says that she cannot take the nortriptyline as prescribed because it makes her too sleepy so she takes 25 mg twice a day and 50 mg at night.  COPD Patient reports her shortness of breath is stable.  She says that she uses her Spiriva daily and requires her albuterol approximately twice a week.  She reports she notices a shortness of breath especially with exertion.  Depression Patient reports her depression is essentially stable but she notices it worsens when she is having the lower back pain.  She relates it to the fact that she is unable to do the things that she enjoys because of the back pain.  She takes Cymbalta 30 mg and nortriptyline 25 mg in the morning and at lunch and then 50 mg at night.  Objective:  Physical Exam: BP 110/60   Pulse 88   Ht 5' (1.524 m)   Wt 108 lb 8 oz (49.2 kg)   SpO2 97%   BMI 21.19 kg/m   General: NAD HEENT: Atraumatic. Normocephalic.   Neck: No cervical lymphadenopathy.  Cardiac: RRR, no m/r/g Respiratory: CTAB, normal work of breathing, decreased air movement in all lung fields Abdomen: soft, nontender, nondistended, bowel sounds normal GU: Labia, clitoris, urethral orifice all  Normal limits.  Cervix is pink, normal vaginal mucosa Skin: warm and dry, no rashes noted Neuro: alert and oriented  No results found for this or any previous visit (from the past 72 hour(s)).   Assessment/Plan:  Complex regional pain syndrome of left lower extremity Patient reports he is having extreme difficulty with this pain.  Says that she experiences the low back pain that radiates around her left side proximately 5 to 7 days a month.  She is followed all Dr. prescribed recommendations with little relief.  She uses heating packs as well as icy hot.  She was prescribed tramadol 50 mg as needed but says that she had relief from that. - Prescribed 8 doses of Ultracet 2 pills every 8 hours PRN -Continue heating and IcyHot/other topical pain relievers  COPD (chronic obstructive pulmonary disease) ([place]) Patient reports that her shortness of breath is pretty constant.  She takes her Spiriva nightly and requires her albuterol proximately 2 days a week. -Continue Spiriva nightly -Continue albuterol as needed  Depression Patient reports that her depression is stable most of the month but that it worsens when her back pain is increased. -Continue to monitor for worsening signs -Continue Cymbalta 30 mg daily -Continue nortriptyline 25 mg twice a day and 50 mg at night    Lab Orders  No laboratory test(s) ordered today    Meds ordered this encounter  Medications  . traMADol-acetaminophen (ULTRACET) 37.5-325 MG tablet    Sig: Take 2 tablets by mouth every 8 (eight) hours as needed.    Dispense:  16 tablet    Refill:  0  . alendronate (FOSAMAX) 70 MG tablet    Sig: Take 1 tablet (70 mg total) by mouth every 7 (seven) days. Take with a full glass of water on an empty stomach.    Dispense:  4 tablet    Refill:  5     Gifford Shave, MD  PGY-1, [facility]  09/05/19 2:51 PM

## 2019-09-05 NOTE — Assessment & Plan Note (Signed)
Patient reports that her depression is stable most of the month but that it worsens when her back pain is increased. -Continue to monitor for worsening signs -Continue Cymbalta 30 mg daily -Continue nortriptyline 25 mg twice a day and 50 mg at night

## 2019-09-05 NOTE — Assessment & Plan Note (Signed)
Patient reports he is having extreme difficulty with this pain.  Says that she experiences the low back pain that radiates around her left side proximately 5 to 7 days a month.  She is followed all Dr. prescribed recommendations with little relief.  She uses heating packs as well as icy hot.  She was prescribed tramadol 50 mg as needed but says that she had relief from that. - Prescribed 8 doses of Ultracet 2 pills every 8 hours PRN -Continue heating and IcyHot/other topical pain relievers

## 2019-09-05 NOTE — Patient Instructions (Addendum)
It was a pleasure to meet you today.  We did your Pap smear today and results will back to my chart.  You will need another Pap smear in 5 years.  Regarding your back pain I am going to prescribe Ultracet.  I want you to take 2 pills as needed for breakthrough pain every 8 hours.  You can continue to use your topical creams and heating pads as well.  We also discussed your new diagnosis of osteoporosis.  I am going to start you on a drug called alendronate.  It is a 70 mg tablet that you take once a week.  I would also like you to get Os-Cal D.  It is an over-the-counter calcium and vitamin D supplement and take it as directed on the bottle.  Regarding your constipation please continue to use the Colace and MiraLAX together as needed.  Continue to drink plenty of fluids as well.  If he has any questions or concerns please feel free to reach out and we can schedule an appointment

## 2019-09-05 NOTE — Assessment & Plan Note (Signed)
Patient reports that her shortness of breath is pretty constant.  She takes her Spiriva nightly and requires her albuterol proximately 2 days a week. -Continue Spiriva nightly -Continue albuterol as needed

## 2019-09-06 ENCOUNTER — Other Ambulatory Visit: Payer: Self-pay | Admitting: Family Medicine

## 2019-09-08 LAB — CYTOLOGY - PAP
Diagnosis: NEGATIVE
HPV: NOT DETECTED

## 2019-09-08 NOTE — Telephone Encounter (Signed)
Pt calling to let Dr. Caron Presume know that she called the pharmacy to update the medication directions but they said they need a new Rx. She is taking Nortriptyline as follows.   1- 25 mg in the am 1- 25 mg in the afternoon  1-50 mg in the eveing.  Ottis Stain, CMA

## 2019-09-09 NOTE — Progress Notes (Signed)
Pap smear results within normal limits.  Will need a repeat in 5 years.

## 2019-09-15 ENCOUNTER — Ambulatory Visit
Admission: RE | Admit: 2019-09-15 | Discharge: 2019-09-15 | Disposition: A | Discharge status: Home / Self Care | Payer: Medicare HMO | Source: Ambulatory Visit | Attending: Family Medicine | Admitting: Family Medicine

## 2019-09-15 ENCOUNTER — Other Ambulatory Visit: Payer: Self-pay

## 2019-09-15 DIAGNOSIS — Z87891 Personal history of nicotine dependence: Secondary | ICD-10-CM | POA: Diagnosis not present

## 2019-09-15 DIAGNOSIS — Z122 Encounter for screening for malignant neoplasm of respiratory organs: Secondary | ICD-10-CM

## 2019-09-16 ENCOUNTER — Telehealth: Payer: Self-pay | Admitting: Pharmacist

## 2019-09-16 NOTE — Telephone Encounter (Signed)
-----   Message from Leavy Cella, [facility] sent at 08/15/2019 10:56 AM EDT ----- Regarding: tobacco cessation relapsed in early august 2020 - quit again ~ 08/04/2019

## 2019-09-16 NOTE — Telephone Encounter (Signed)
Phone call follow-up of tobacco cessation.   Patient reports NO smoking since last contact ~ 1 month ago.  Currently has 6 continuous weeks of complete abstinence from tobacco AND any gum, patches.   She is confident in her potential for long-term success 8/10 for complete cessation until the end of the year. We reviewed risks of smoking even 1 puff if confronted with significant stress leading to cigarette craving. I encouraged her continued abstinence. We agreed I will attempt to call her again in 1 additional month.

## 2019-09-22 DIAGNOSIS — R1084 Generalized abdominal pain: Secondary | ICD-10-CM | POA: Diagnosis not present

## 2019-09-22 DIAGNOSIS — R140 Abdominal distension (gaseous): Secondary | ICD-10-CM | POA: Diagnosis not present

## 2019-09-22 DIAGNOSIS — K5900 Constipation, unspecified: Secondary | ICD-10-CM | POA: Diagnosis not present

## 2019-09-30 ENCOUNTER — Telehealth: Payer: Self-pay | Admitting: Family Medicine

## 2019-09-30 NOTE — Telephone Encounter (Signed)
Spoke with patient on the phone regarding CT results.  Explained her that she needs to discontinue her yearly CT screening.  She was agreeable to this she.  She also had questions regarding the description of atherosclerosis as well as calcifications.  I discussed with her what this meant.  She is on atorvastatin at this time.  She was on metoprolol at one point but that was discontinued.  Will address this at a later visit

## 2019-09-30 NOTE — Telephone Encounter (Signed)
Pt is calling and would like for someone to call her to discuss results from her CT on 09/21. She said she received the results on mychart but was having a hard time understanding them.   The best call back # is  [phone]

## 2019-10-01 ENCOUNTER — Other Ambulatory Visit: Payer: Self-pay

## 2019-10-01 ENCOUNTER — Ambulatory Visit (INDEPENDENT_AMBULATORY_CARE_PROVIDER_SITE_OTHER): Payer: Medicare HMO | Admitting: *Deleted

## 2019-10-01 VITALS — Temp 98.2°F

## 2019-10-01 DIAGNOSIS — Z23 Encounter for immunization: Secondary | ICD-10-CM | POA: Diagnosis not present

## 2019-10-16 ENCOUNTER — Telehealth: Payer: Self-pay | Admitting: Pharmacist

## 2019-10-16 MED ORDER — NORTRIPTYLINE HCL 25 MG PO CAPS: 50.0000 mg | ORAL_CAPSULE | Freq: Every day | ORAL | Status: DC

## 2019-10-16 NOTE — Telephone Encounter (Signed)
Contacted patient to follow-up on tobacco cessation.  Patient reports continued abstinence since last contact.   She has quit for approximately 3 months with 1 small "slip".  She is confident that she has "quit for good".  She is no longer taking Nicotine patches.  She is willing to taper down her nortriptyline (likely to improve her dry mouth).  She is taking nortiptyline 25mg  AM , 25mg  mid-day and 50mg  QPM.  Advised to stop the mid-day dose then in 1 week to discontinue the AM dose.  She can contnue the 50mg  QPM dose and we will reevaluate in 1 month.   Patient also shared she continues to take tiopropium (Spiriva) daily as well as albuterol multiple time most days.  We briefly discussed the potential use of a combination inhaler (LABA/LAMA) to evaluate if that improves her control throughout the day an minimizes the PRN albuterol to < 1x daily.   Stiolto Respimat (tiotropium Maurine Minister).  Plan phone follow-up in 1 month.

## 2019-10-16 NOTE — Telephone Encounter (Signed)
Noted and agree. 

## 2019-10-20 DIAGNOSIS — K5900 Constipation, unspecified: Secondary | ICD-10-CM | POA: Diagnosis not present

## 2019-10-20 DIAGNOSIS — R140 Abdominal distension (gaseous): Secondary | ICD-10-CM | POA: Diagnosis not present

## 2019-11-13 ENCOUNTER — Telehealth: Payer: Self-pay | Admitting: Pharmacist

## 2019-11-13 DIAGNOSIS — F17218 Nicotine dependence, cigarettes, with other nicotine-induced disorders: Secondary | ICD-10-CM

## 2019-11-13 NOTE — Telephone Encounter (Signed)
Contacted patient RE tobacco cessation.    She reports continued abstinence from smoking.  She also expressed happiness with quitting for multiple months.   She has quit smoking now for > 5 months with only 1 small "slip".  Appears committed to long-term quit status.   At this time she remains OFF of any tobacco cessation medication.  Encouraged to increase pulmonary exertion to improve lung function by walking faster or up steeper grades to "recondition her lungs" to maximal efficiency post cessation.   She realizes that her COPD is chronic and she has done the most important thing for her lung (quitting smoking).    I plan 1 more phone follow-up to her in ~ 6 weeks (early 2021).   If she continues to remain abstinent from cigarettes at that time, I anticipate changing her smoking status to "former/past" tobacco use.

## 2019-11-13 NOTE — Assessment & Plan Note (Signed)
She reports continued abstinence from smoking.  She also expressed happiness with quitting for multiple months.   She has quit smoking now for > 5 months with only 1 small "slip".  Appears committed to long-term quit status.   At this time she remains OFF of any tobacco cessation medication.  Encouraged to increase pulmonary exertion to improve lung function by walking faster or up steeper grades to "recondition her lungs" to maximal efficiency post cessation.   She realizes that her COPD is chronic and she has done the most important thing for her lung (quitting smoking).    I plan 1 more phone follow-up to her in ~ 6 weeks (early 2021).   If she continues to remain abstinent from cigarettes at that time, I anticipate changing her smoking status to "former/past" tobacco use.

## 2019-11-13 NOTE — Telephone Encounter (Signed)
Noted and agree. 

## 2019-11-17 DIAGNOSIS — K5900 Constipation, unspecified: Secondary | ICD-10-CM | POA: Diagnosis not present

## 2019-12-03 ENCOUNTER — Other Ambulatory Visit: Payer: Self-pay | Admitting: Family Medicine

## 2020-01-04 IMAGING — MG DIGITAL DIAGNOSTIC BILATERAL MAMMOGRAM WITH TOMO AND CAD
6 of 10 series · 6 of 26 positions shown · non-contrast
Comparison: Previous exam(s).

CLINICAL DATA: Follow-up probably benign calcifications in the
outer right breast evaluated in 1974. The patient did not return for
a recommended 6 month follow-up.

EXAM:
DIGITAL DIAGNOSTIC BILATERAL MAMMOGRAM WITH CAD AND TOMO

[R CC]
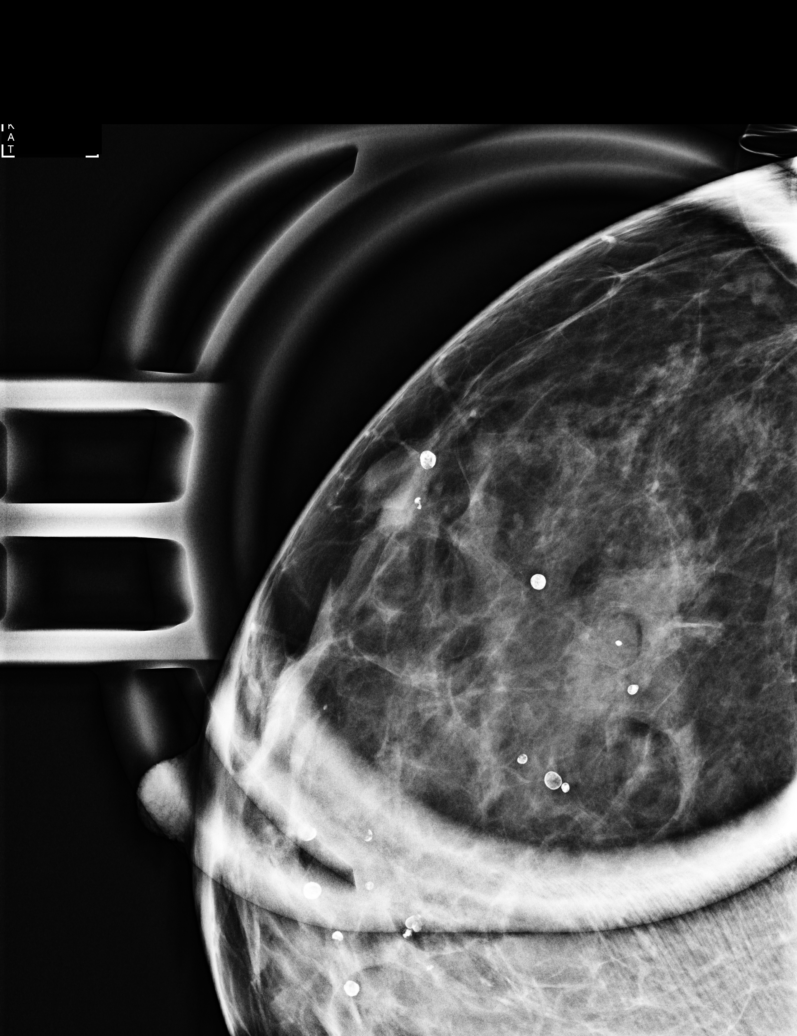

[R ML]
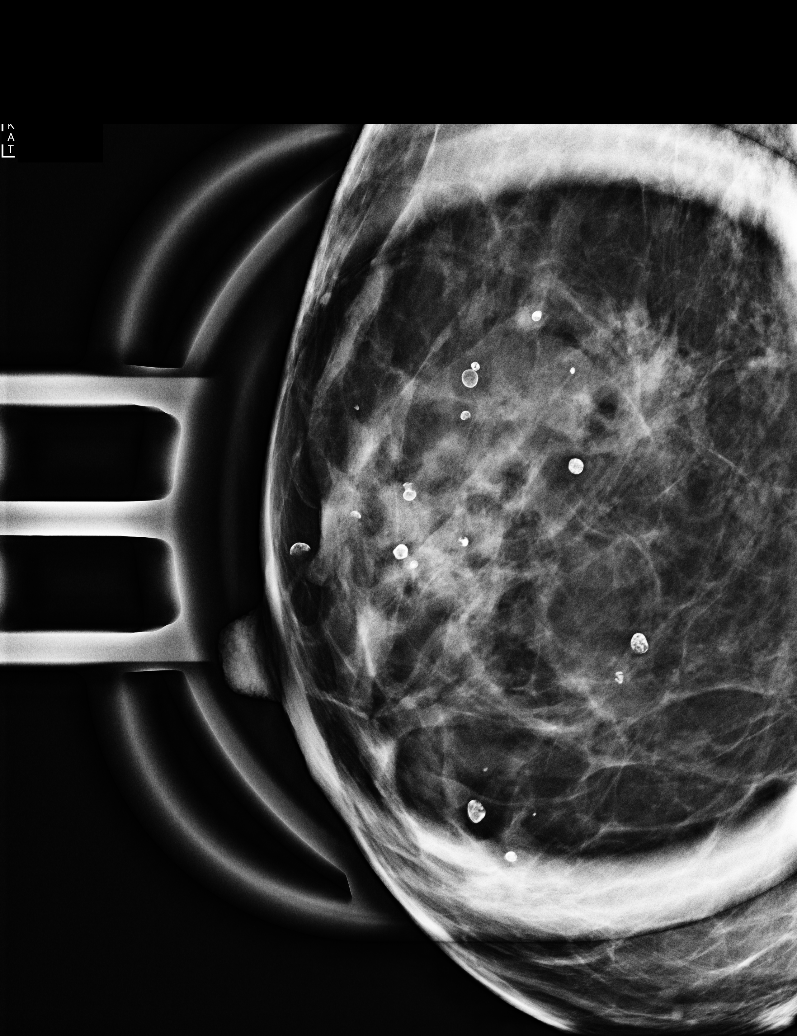

[R CC synth-2D]
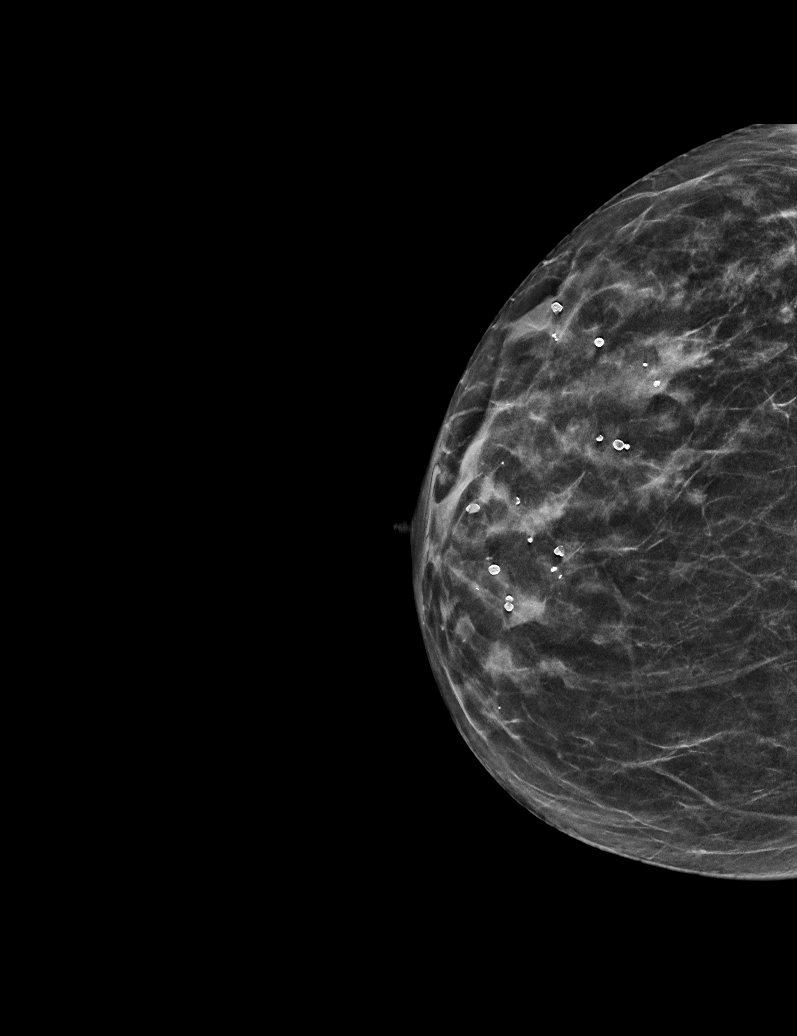

[L MLO synth-2D]
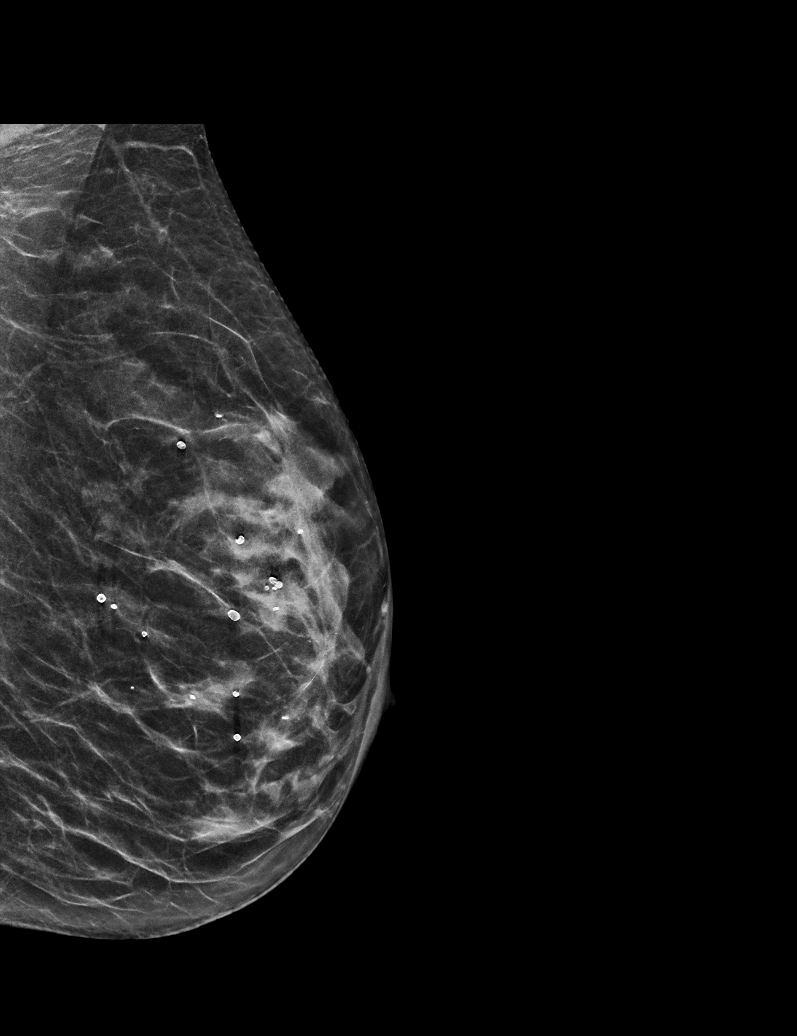

[R MLO synth-2D]
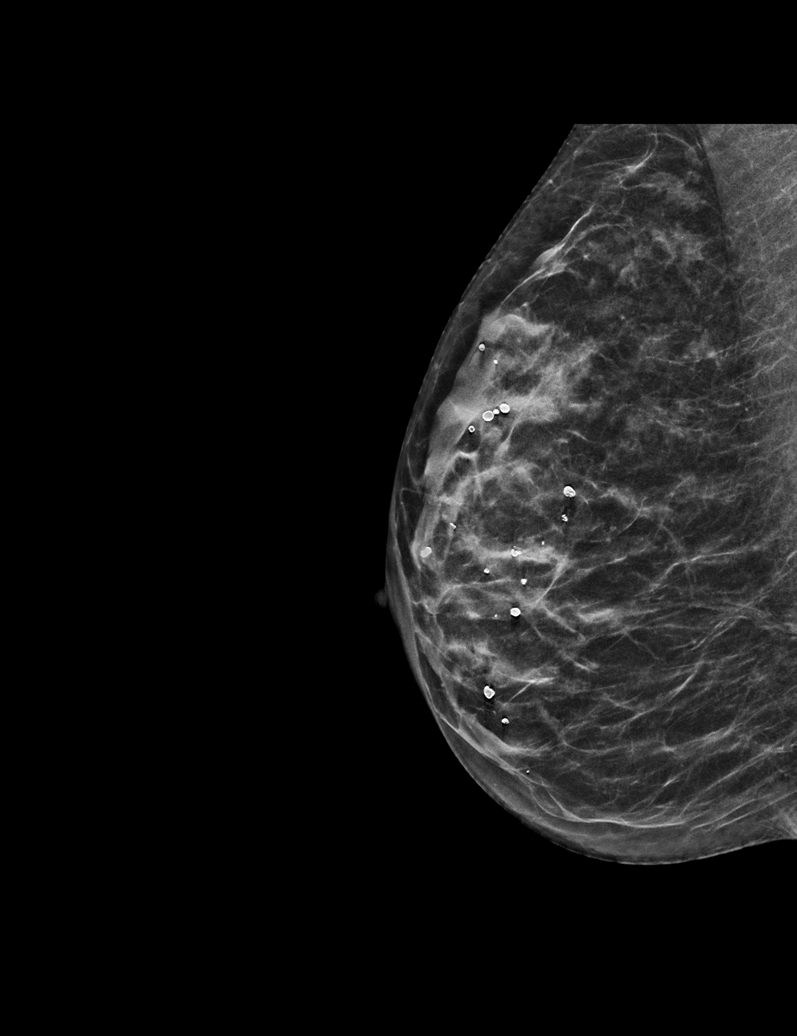

[L CC synth-2D]
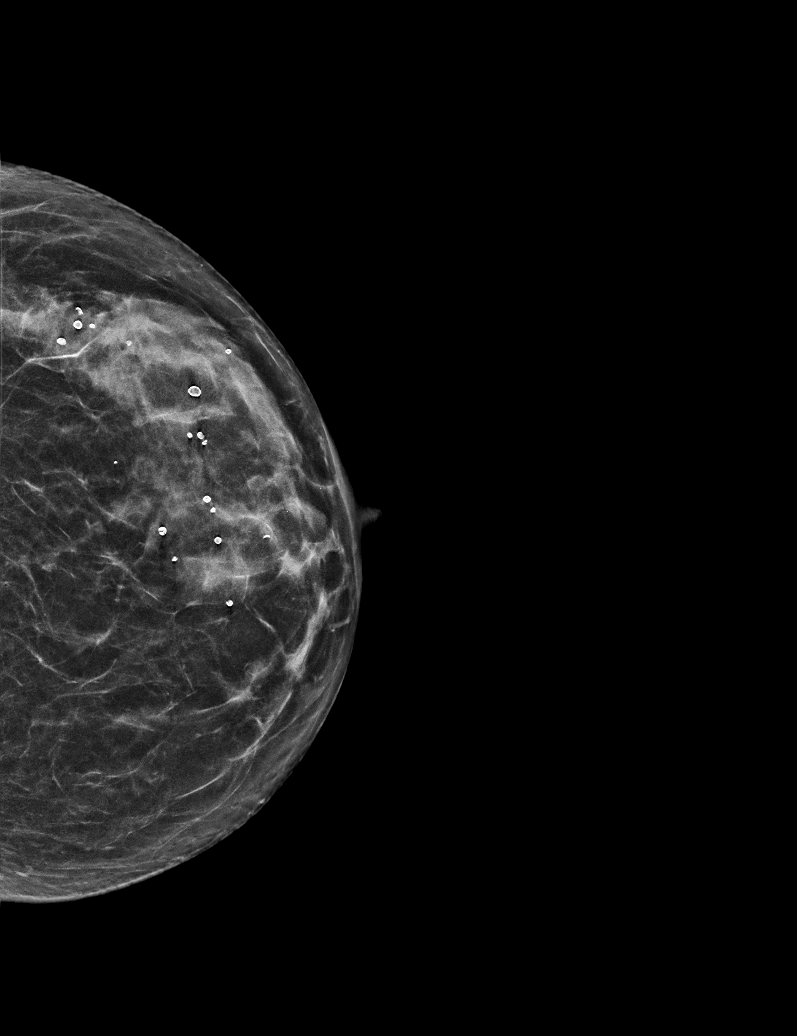

[6 of 26 positions shown; findings below may reference images not displayed]

ACR Breast Density Category c: The breast tissue is heterogeneously
dense, which may obscure small masses.
FINDINGS: The previously demonstrated probably benign calcifications in the
outer right breast have evolved into more typically benign
calcifications associated with fat necrosis and oil cyst formation.
Similar calcifications elsewhere in both breasts without significant
change. No interval findings suspicious for malignancy in either
breast.

Mammographic images were processed with CAD.
IMPRESSION: Benign bilateral breast calcifications.  No evidence of malignancy.

RECOMMENDATION:
Bilateral screening mammogram in 1 year.

I have discussed the findings and recommendations with the patient.
Results were also provided in writing at the conclusion of the
visit. If applicable, a reminder letter will be sent to the patient
regarding the next appointment.

BI-RADS CATEGORY  2: Benign.

## 2020-01-05 ENCOUNTER — Telehealth: Payer: Self-pay | Admitting: Pharmacist

## 2020-01-05 DIAGNOSIS — Z87891 Personal history of nicotine dependence: Secondary | ICD-10-CM

## 2020-01-05 NOTE — Assessment & Plan Note (Addendum)
Phone Call Follow-up for tobacco cessation evaluation.   Patient reports continued abstinence from tobacco use.   She denies any significant triggers or cravings.  She states high level of confidence for long-term cessation at this time.  We reviewed "getting through triggers" in the future and thinking of herself as a "non-smoker".    She was appreciative of the call.  Plan to follow-up 05/2020 for 1 year assessment of quit status.    Note:  STATUS of problem was changed to "history of tobacco abuse" as she is now quit for 6 months.

## 2020-01-05 NOTE — Telephone Encounter (Signed)
Phone Call Follow-up for tobacco cessation evaluation.   Patient reports continued abstinence from tobacco use.   She denies any significant triggers or cravings.  She states high level of confidence for long-term cessation at this time.  We reviewed "getting through triggers" in the future and thinking of herself as a "non-smoker".    She was appreciative of the call.  Plan to follow-up 05/2020 for 1 year assessment of quit status.

## 2020-01-05 NOTE — Telephone Encounter (Signed)
Noted and agree. 

## 2020-02-02 ENCOUNTER — Other Ambulatory Visit: Payer: Self-pay | Admitting: Family Medicine

## 2020-02-18 ENCOUNTER — Other Ambulatory Visit: Payer: Self-pay | Admitting: *Deleted

## 2020-02-18 MED ORDER — ALENDRONATE SODIUM 70 MG PO TABS: 70.0000 mg | ORAL_TABLET | ORAL | 5 refills | Status: DC

## 2020-03-08 ENCOUNTER — Other Ambulatory Visit: Payer: Self-pay | Admitting: Pharmacist

## 2020-03-08 ENCOUNTER — Other Ambulatory Visit: Payer: Self-pay | Admitting: Pharmacy Technician

## 2020-03-08 NOTE — Patient Outreach (Signed)
[facility]) Care Management  03/08/2020  Candace Middleton 1961/04/13 [id number]  Patient was called regarding medication assistance. Unfortunately, she did not answer her phone. HIPAA compliant message was left on her voicemail.  Plan: Call patient back in 5-7 business days.  [name], PharmD, [place] Clinical Pharmacist [phone]

## 2020-03-08 NOTE — Patient Outreach (Signed)
[facility]) Care Management  03/08/2020  Candace Middleton 1961/07/05 [id number]    Return call placed to patient regarding patient assistance application(s) for 4136 for Spiriva with BI and Proventil HFA with [employer] , HIPAA identifiers verified.   Patient was calling to  Inquire about 2021 patient assistance. Informed patient a referral would be placed today for a pharmacist to outreach patient. Patient was agreeable to this plan.  Patient also was calling to inquire about COVID19 Vaccine and upon return call , patient informed she had reached out to the [facility] Dept to schedule and ask her questions.  Follow up:  Referral placed in [place] [facility] for Medication Assistance.  Moris Ratchford P. Lilie Vezina, [place]  [phone]

## 2020-03-15 ENCOUNTER — Other Ambulatory Visit: Payer: Self-pay | Admitting: Pharmacist

## 2020-03-15 NOTE — Patient Outreach (Addendum)
[facility]) [place]   03/15/2020  Candace Middleton [birth year] [id number]  Reason for referral: medication assistance  Referral source: self/Jill Simcox Referral medication(s): Spiriva and Proventil HFA Current insurance:[employer]  HPI:  Patient was called regarding medication assistance. HIPAA identifiers were obtained. Patient is a 59 year old female with multiple medical conditions including but not limited to:  COPD, depression, CAD, osteoporosis, hyperlipidemia, and history of bladder cancer.   Objective: Allergies  Allergen Reactions  . Gabapentin Hives    Patient reports facial swelling, hives, facial involvement   . Sulfa Antibiotics Anaphylaxis    Onset at age 68, "almost died because I stopped breathing."    Medications Reviewed Today    Reviewed by [name], [facility] (Pharmacist) on 03/15/20 at 1046  Med List Status: <None>  Medication Order Taking? Sig Documenting Provider Last Dose Status Informant  albuterol (PROVENTIL HFA;VENTOLIN HFA) 108 (90 Base) MCG/ACT inhaler [id number] Yes Inhale 2 puffs into the lungs every 6 (six) hours as needed for wheezing or shortness of breath. Glenis Smoker, MD Taking Active   alendronate (FOSAMAX) 70 MG tablet [id number] Yes Take 1 tablet (70 mg total) by mouth every 7 (seven) days. Take with a full glass of water on an empty stomach. Gifford Shave, MD Taking Active   aspirin EC 81 MG tablet [id number] Yes Take 1 tablet (81 mg total) by mouth daily. Brayton Caves, PA-C Taking Active Self  atorvastatin (LIPITOR) 20 MG tablet [id number] Yes TAKE 1 TABLET BY MOUTH EVERY DAY Gifford Shave, MD Taking Active        Patient not taking:      Discontinued 03/15/20 1045 (Completed Course)   DULoxetine (CYMBALTA) 30 MG capsule [id number] Yes TAKE 2 CAPSULES(60 MG) BY MOUTH DAILY Gifford Shave, MD Taking Active   LINZESS 290 MCG CAPS capsule [id number] Yes Take 1 capsule by mouth daily.  [provider] Taking Active         Discontinued 02/02/20 [phone] (Reorder)   nortriptyline (PAMELOR) 25 MG capsule [id number] Yes TAKE 1 CAPSULE BY MOUTH AT BREAKFAST AND LUNCH AND 2 CAPSULES AT BEDTIME Gifford Shave, MD Taking Active        Patient not taking:      Discontinued 03/15/20 1046 (Change in therapy)   tiotropium (SPIRIVA) 18 MCG inhalation capsule [id number]  Place 1 capsule (18 mcg total) into inhaler and inhale daily. Glenis Smoker, MD  Active        Patient not taking:      Discontinued 03/15/20 1046 (Completed Course)           Medication Assistance Findings:  Medication assistance needs identified: [place].   Additional medication assistance options reviewed with patient as warranted:  No other options identified   Patient is over income for Extra Help  Plan: I will route patient assistance letter to [place] technician who will coordinate patient assistance program application process for medications listed above.  [facility] pharmacy technician will assist with obtaining all required documents from both patient and provider(s) and submit application(s) once completed.    Patient will be followed by [place] Technician, Susy Frizzle, CPhT  [name], PharmD, [place] Clinical Pharmacist [phone]

## 2020-03-18 ENCOUNTER — Other Ambulatory Visit: Payer: Self-pay | Admitting: Pharmacy Technician

## 2020-03-18 NOTE — Patient Outreach (Signed)
[place] [facility]) Care Management  03/18/2020  Candace Middleton [birth year] [id number]                                       Medication Assistance Referral  Referral From: [place]  Medication/Company: Rolan Lipa / [employer] Patient application portion:  Mailed Provider application portion: Faxed  to Dr. Carol Ada Provider address/fax verified via: Office website  Medication/Company: Stann Ore / [employer] Patient application portion:  Mailed Provider application portion: Faxed  to Dr. Gifford Shave Provider address/fax verified via: Office website  Medication/Company: Proventil HFA / [employer] Patient application portion:  Mailed Provider application portion: Interoffice Mailed to Dr. Gifford Shave Provider address/fax verified via: Office website   Follow up:  Will follow up with patient in 10-14 business days to confirm application(s) have been received.  Quamere Mussell P. Jahel Wavra, [place]  [phone]

## 2020-03-20 ENCOUNTER — Ambulatory Visit (INDEPENDENT_AMBULATORY_CARE_PROVIDER_SITE_OTHER): Payer: Medicare HMO

## 2020-03-20 ENCOUNTER — Ambulatory Visit (HOSPITAL_COMMUNITY)
Admission: EM | Admit: 2020-03-20 | Discharge: 2020-03-20 | Disposition: A | Discharge status: Home / Self Care | Payer: Medicare HMO | Attending: Urgent Care | Admitting: Urgent Care

## 2020-03-20 ENCOUNTER — Other Ambulatory Visit: Payer: Self-pay

## 2020-03-20 DIAGNOSIS — E785 Hyperlipidemia, unspecified: Secondary | ICD-10-CM | POA: Insufficient documentation

## 2020-03-20 DIAGNOSIS — Z820 Family history of epilepsy and other diseases of the nervous system: Secondary | ICD-10-CM | POA: Insufficient documentation

## 2020-03-20 DIAGNOSIS — Z882 Allergy status to sulfonamides status: Secondary | ICD-10-CM | POA: Diagnosis not present

## 2020-03-20 DIAGNOSIS — R05 Cough: Secondary | ICD-10-CM | POA: Insufficient documentation

## 2020-03-20 DIAGNOSIS — Z79899 Other long term (current) drug therapy: Secondary | ICD-10-CM | POA: Insufficient documentation

## 2020-03-20 DIAGNOSIS — F419 Anxiety disorder, unspecified: Secondary | ICD-10-CM | POA: Diagnosis not present

## 2020-03-20 DIAGNOSIS — Z8349 Family history of other endocrine, nutritional and metabolic diseases: Secondary | ICD-10-CM | POA: Insufficient documentation

## 2020-03-20 DIAGNOSIS — Z8249 Family history of ischemic heart disease and other diseases of the circulatory system: Secondary | ICD-10-CM | POA: Insufficient documentation

## 2020-03-20 DIAGNOSIS — Z7982 Long term (current) use of aspirin: Secondary | ICD-10-CM | POA: Diagnosis not present

## 2020-03-20 DIAGNOSIS — R0789 Other chest pain: Secondary | ICD-10-CM

## 2020-03-20 DIAGNOSIS — Z888 Allergy status to other drugs, medicaments and biological substances status: Secondary | ICD-10-CM | POA: Insufficient documentation

## 2020-03-20 DIAGNOSIS — I2510 Atherosclerotic heart disease of native coronary artery without angina pectoris: Secondary | ICD-10-CM | POA: Diagnosis not present

## 2020-03-20 DIAGNOSIS — Z20822 Contact with and (suspected) exposure to covid-19: Secondary | ICD-10-CM | POA: Insufficient documentation

## 2020-03-20 DIAGNOSIS — J449 Chronic obstructive pulmonary disease, unspecified: Secondary | ICD-10-CM

## 2020-03-20 DIAGNOSIS — R0602 Shortness of breath: Secondary | ICD-10-CM

## 2020-03-20 DIAGNOSIS — F329 Major depressive disorder, single episode, unspecified: Secondary | ICD-10-CM | POA: Insufficient documentation

## 2020-03-20 DIAGNOSIS — Z7983 Long term (current) use of bisphosphonates: Secondary | ICD-10-CM | POA: Diagnosis not present

## 2020-03-20 DIAGNOSIS — Z833 Family history of diabetes mellitus: Secondary | ICD-10-CM | POA: Diagnosis not present

## 2020-03-20 DIAGNOSIS — J439 Emphysema, unspecified: Secondary | ICD-10-CM | POA: Insufficient documentation

## 2020-03-20 DIAGNOSIS — Z808 Family history of malignant neoplasm of other organs or systems: Secondary | ICD-10-CM | POA: Insufficient documentation

## 2020-03-20 DIAGNOSIS — F17210 Nicotine dependence, cigarettes, uncomplicated: Secondary | ICD-10-CM | POA: Diagnosis not present

## 2020-03-20 DIAGNOSIS — R059 Cough, unspecified: Secondary | ICD-10-CM

## 2020-03-20 LAB — SARS CORONAVIRUS 2 (TAT 6-24 HRS): SARS Coronavirus 2: NEGATIVE

## 2020-03-20 MED ORDER — PROMETHAZINE-DM 6.25-15 MG/5ML PO SYRP: 5.0000 mL | ORAL_SOLUTION | Freq: Every evening | ORAL | 0 refills | Status: DC | PRN

## 2020-03-20 MED ORDER — PREDNISONE 20 MG PO TABS: ORAL_TABLET | ORAL | 0 refills | Status: DC

## 2020-03-20 MED ORDER — BENZONATATE 100 MG PO CAPS: 100.0000 mg | ORAL_CAPSULE | Freq: Three times a day (TID) | ORAL | 0 refills | Status: DC | PRN

## 2020-03-20 MED ORDER — ALBUTEROL SULFATE HFA 108 (90 BASE) MCG/ACT IN AERS
INHALATION_SPRAY | RESPIRATORY_TRACT | Status: AC
  Filled 2020-03-20: qty 6.7

## 2020-03-20 MED ORDER — ALBUTEROL SULFATE HFA 108 (90 BASE) MCG/ACT IN AERS
2.0000 | INHALATION_SPRAY | Freq: Once | RESPIRATORY_TRACT | Status: AC
  Administered 2020-03-20: 2 via RESPIRATORY_TRACT

## 2020-03-20 NOTE — ED Provider Notes (Addendum)
[facility]   MRN: [id number] DOB: 08-30-1961  Subjective:   Candace Middleton is a 59 y.o. female presenting for 2-day history of cute onset dry hacking cough that elicits chest pain, belly pain, back pain and shortness of breath.  Patient has a history of emphysema and is out of her inhalers.  She also has a history of CAD, coronary artery vasospasms.  She has an immunocompromise son at home, is on Humira and has multiple chronic conditions.  Both of them have gotten 1 dose of the vaccine.   Current Facility-Administered Medications:  .  albuterol (VENTOLIN HFA) 108 (90 Base) MCG/ACT inhaler 2 puff, 2 puff, Inhalation, Once, Wallis Bamberg, PA-C  Current Outpatient Medications:  .  albuterol (PROVENTIL HFA;VENTOLIN HFA) 108 (90 Base) MCG/ACT inhaler, Inhale 2 puffs into the lungs every 6 (six) hours as needed for wheezing or shortness of breath., Disp: 1 Inhaler, Rfl: 2 .  alendronate (FOSAMAX) 70 MG tablet, Take 1 tablet (70 mg total) by mouth every 7 (seven) days. Take with a full glass of water on an empty stomach., Disp: 4 tablet, Rfl: 5 .  aspirin EC 81 MG tablet, Take 1 tablet (81 mg total) by mouth daily., Disp: 30 tablet, Rfl: 3 .  atorvastatin (LIPITOR) 20 MG tablet, TAKE 1 TABLET BY MOUTH EVERY DAY, Disp: 90 tablet, Rfl: 3 .  DULoxetine (CYMBALTA) 30 MG capsule, TAKE 2 CAPSULES(60 MG) BY MOUTH DAILY, Disp: 60 capsule, Rfl: 4 .  LINZESS 290 MCG CAPS capsule, Take 1 capsule by mouth daily., Disp: , Rfl:  .  nortriptyline (PAMELOR) 25 MG capsule, TAKE 1 CAPSULE BY MOUTH AT BREAKFAST AND LUNCH AND 2 CAPSULES AT BEDTIME, Disp: 120 capsule, Rfl: 2 .  tiotropium (SPIRIVA) 18 MCG inhalation capsule, Place 1 capsule (18 mcg total) into inhaler and inhale daily., Disp: 30 capsule, Rfl: 12   Allergies  Allergen Reactions  . Gabapentin Hives    Patient reports facial swelling, hives, facial involvement   . Sulfa Antibiotics Anaphylaxis    Onset at age 26, "almost died because I  stopped breathing."    Past Medical History:  Diagnosis Date  . Allergy   . Anxiety   . Arthritis   . Cancer (HCC)    bladder ca  . COPD (chronic obstructive pulmonary disease) (HCC)   . Depression   . GERD (gastroesophageal reflux disease)   . Hyperlipidemia   . Neuromuscular disorder (HCC) 2014   RSD - nerve blocks in back for treatment   . Osteoporosis 09/05/2019     Past Surgical History:  Procedure Laterality Date  . bladder cancer  2006  . BLADDER TUMOR EXCISION  12/25/2004  . BREAST SURGERY  "long time ago"   lumpectomy   . CARDIAC CATHETERIZATION N/A 09/07/2015   Procedure: Left Heart Cath and Coronary Angiography;  Surgeon: Corky Crafts, MD;  Location: [facility] INVASIVE CV LAB;  Service: Cardiovascular;  Laterality: N/A;  . CESAREAN SECTION  1982  . ENDOMETRIAL ABLATION  12/25/1993    Family History  Problem Relation Age of Onset  . Heart disease Mother   . Thyroid disease Sister   . Melanoma Sister   . Diabetes Brother   . Epilepsy Son   . Early death Father   . Pulmonary embolism Father   . Thyroid disease Brother   . Colon cancer Neg Hx     Social History   Tobacco Use  . Smoking status: Current Some Day Smoker    Packs/day: 0.50  .  Smokeless tobacco: Never Used  Substance Use Topics  . Alcohol use: No  . Drug use: No    ROS   Objective:   Vitals: BP 130/75 (BP Location: Right Arm)   Pulse 92   Temp 98 F (36.7 C)   Resp 16   SpO2 100%   Physical Exam Constitutional:      General: She is not in acute distress.    Appearance: Normal appearance. She is well-developed. She is not ill-appearing, toxic-appearing or diaphoretic.  HENT:     Head: Normocephalic and atraumatic.     Nose: Nose normal.     Mouth/Throat:     Mouth: Mucous membranes are moist.  Eyes:     Extraocular Movements: Extraocular movements intact.     Pupils: Pupils are equal, round, and reactive to light.  Cardiovascular:     Rate and Rhythm: Normal rate and  regular rhythm.     Pulses: Normal pulses.     Heart sounds: Normal heart sounds. No murmur. No friction rub. No gallop.   Pulmonary:     Effort: Pulmonary effort is normal. No respiratory distress.     Breath sounds: No stridor. Examination of the left-middle field reveals wheezing. Wheezing present. No rhonchi or rales.  Skin:    General: Skin is warm and dry.     Findings: No rash.  Neurological:     Mental Status: She is alert and oriented to person, place, and time.  Psychiatric:        Mood and Affect: Mood normal. Mood is not anxious.        Behavior: Behavior normal. Behavior is not agitated.        Thought Content: Thought content normal.    Radiology report pending, emphysema changes noted but otherwise negative for pneumonia.  Assessment and Plan :   1. Cough   2. Atypical chest pain   3. Shortness of breath   4. Pulmonary emphysema, unspecified emphysema type (HCC)     Start prednisone course, use supportive medications, provided patient with albuterol inhaler in clinic. COVID 19 testing pending. Counseled patient on potential for adverse effects with medications prescribed/recommended today, ER and return-to-clinic precautions discussed, patient verbalized understanding.    Wallis Bamberg, PA-C 03/20/20 1201   UPDATE:  DG Chest 2 View  Result Date: 03/20/2020 CLINICAL DATA:  Patient with cough and tachycardia. EXAM: CHEST - 2 VIEW COMPARISON:  Chest radiograph 01/16/2018 FINDINGS: Normal cardiac and mediastinal contours. No consolidative pulmonary opacities. No pleural effusion or pneumothorax. Thoracic spine degenerative changes. IMPRESSION: No acute cardiopulmonary process. Electronically Signed   By: Annia Belt M.D.   On: 03/20/2020 12:05      Wallis Bamberg, PA-C 03/20/20 1210

## 2020-03-20 NOTE — ED Triage Notes (Signed)
Pt present coughing and congestion with SOB, symptom started on Thursday,  She has a history of COPD.

## 2020-03-20 NOTE — Discharge Instructions (Addendum)
For sore throat or cough try using a honey-based tea. Use 3 teaspoons of honey with juice squeezed from half lemon. Place shaved pieces of ginger into 1/2-1 cup of water and warm over stove top. Then mix the ingredients and repeat every 4 hours as needed. Please take Tylenol 500mg  every 6 hours. Hydrate very well with at least 2 liters of water. Eat light meals such as soups to replenish electrolytes and soft fruits, veggies. Start an antihistamine like Zyrtec (cetirizine) at 10mg  daily for postnasal drainage, sinus congestion.  You can take this together with pseudoephedrine (Sudafed) at a dose of 60 mg 3 times a day or twice daily as needed for the same kind of congestion.

## 2020-03-27 ENCOUNTER — Encounter (HOSPITAL_COMMUNITY): Payer: Self-pay

## 2020-03-27 ENCOUNTER — Other Ambulatory Visit: Payer: Self-pay

## 2020-03-27 ENCOUNTER — Ambulatory Visit (HOSPITAL_COMMUNITY)
Admission: EM | Admit: 2020-03-27 | Discharge: 2020-03-27 | Disposition: A | Discharge status: Home / Self Care | Payer: Medicare HMO | Attending: Family Medicine | Admitting: Family Medicine

## 2020-03-27 DIAGNOSIS — J40 Bronchitis, not specified as acute or chronic: Secondary | ICD-10-CM | POA: Diagnosis not present

## 2020-03-27 DIAGNOSIS — R062 Wheezing: Secondary | ICD-10-CM | POA: Diagnosis not present

## 2020-03-27 DIAGNOSIS — J441 Chronic obstructive pulmonary disease with (acute) exacerbation: Secondary | ICD-10-CM

## 2020-03-27 DIAGNOSIS — R05 Cough: Secondary | ICD-10-CM | POA: Diagnosis not present

## 2020-03-27 DIAGNOSIS — R0602 Shortness of breath: Secondary | ICD-10-CM

## 2020-03-27 DIAGNOSIS — R059 Cough, unspecified: Secondary | ICD-10-CM

## 2020-03-27 MED ORDER — METHYLPREDNISOLONE SODIUM SUCC 125 MG IJ SOLR
60.0000 mg | Freq: Once | INTRAMUSCULAR | Status: AC
  Administered 2020-03-27: 60 mg via INTRAMUSCULAR

## 2020-03-27 MED ORDER — ALBUTEROL SULFATE (2.5 MG/3ML) 0.083% IN NEBU: 2.5000 mg | INHALATION_SOLUTION | Freq: Four times a day (QID) | RESPIRATORY_TRACT | 12 refills | Status: DC | PRN

## 2020-03-27 MED ORDER — METHYLPREDNISOLONE SODIUM SUCC 125 MG IJ SOLR
INTRAMUSCULAR | Status: AC
  Filled 2020-03-27: qty 2

## 2020-03-27 MED ORDER — HYDROCODONE-HOMATROPINE 5-1.5 MG/5ML PO SYRP: 5.0000 mL | ORAL_SOLUTION | Freq: Four times a day (QID) | ORAL | 0 refills | Status: DC | PRN

## 2020-03-27 MED ORDER — DOXYCYCLINE HYCLATE 100 MG PO CAPS: 100.0000 mg | ORAL_CAPSULE | Freq: Two times a day (BID) | ORAL | 0 refills | Status: DC

## 2020-03-27 NOTE — ED Triage Notes (Signed)
Pt is here for a follow with the same symptoms as her last visit on 03/20/2020. Her COVID test results were NEGATIVE.

## 2020-03-27 NOTE — Discharge Instructions (Addendum)
You are likely experiencing a COPD exacerbation.  It has been going on for about 2 weeks I am going to go ahead and do an antibiotic.  I sent in doxycycline for you to take twice a day for 7 days.  I also sent in a different cough medicine for you to take at night.  You received a dose of Solu-Medrol, which is a steroid to help you open up and breathe a little better.  If you are not feeling better by Monday, you may follow-up with [place] or with primary care.  I would have you go to the ER for trouble swallowing, trouble breathing, high fever, other concerning symptoms.

## 2020-03-27 NOTE — ED Provider Notes (Signed)
[facility]    CSN: [id number] Arrival date & time: 03/27/20  1037      History   Chief Complaint Chief Complaint  Patient presents with  . Cough    HPI Candace Middleton is a 59 y.o. female.   Patient reports that she has had a cough for at least the last 2 weeks.  Was seen in urgent care office on 03/20/2020, Covid results were negative that day.  Patient also endorses shortness of breath with cough.  Upon chart review, patient has medical history significant for COPD, GERD, tobacco use, history of bladder cancer, anxiety, depression.  Patient reports that she has been taking her daily medications as prescribed and using inhalers as prescribed.  Patient ports that she just feels like she is not getting better.  Patient denies headaches, chest pain, nausea, vomiting, diarrhea, chills, body aches, rash, fever, other symptoms.  ROS per HPI  The history is provided by the patient.    Past Medical History:  Diagnosis Date  . Allergy   . Anxiety   . Arthritis   . Cancer ([place])    bladder ca  . COPD (chronic obstructive pulmonary disease) ([place])   . Depression   . GERD (gastroesophageal reflux disease)   . Hyperlipidemia   . Neuromuscular disorder ([place]) 2014   RSD - nerve blocks in back for treatment   . Osteoporosis 09/05/2019    Patient Active Problem List   Diagnosis Date Noted  . Osteoporosis 09/05/2019  . COPD (chronic obstructive pulmonary disease) ([place]) 02/05/2018  . Depression 02/05/2018  . Coronary artery vasospasm ([place]) 09/22/2015  . Precordial pain   . Coronary artery disease 09/01/2015  . Hyperlipidemia LDL goal <100 09/01/2015  . History of bladder cancer 04/26/2015  . History of tobacco use 06/24/2013  . RSD (reflex sympathetic dystrophy) 06/24/2013  . Complex regional pain syndrome of left lower extremity 06/24/2013  . Neuromuscular disorder ([place]) 2014    Past Surgical History:  Procedure Laterality Date  . bladder cancer  2006  .  BLADDER TUMOR EXCISION  12/25/2004  . BREAST SURGERY  "long time ago"   lumpectomy   . CARDIAC CATHETERIZATION N/A 09/07/2015   Procedure: Left Heart Cath and Coronary Angiography;  Surgeon: Jettie Booze, MD;  Location: [facility] [facility];  Service: Cardiovascular;  Laterality: N/A;  . [place]  . ENDOMETRIAL ABLATION  12/25/1993    OB History   No obstetric history on file.      Home Medications    Prior to Admission medications   Medication Sig Start Date End Date Taking? Authorizing Provider  albuterol (PROVENTIL HFA;VENTOLIN HFA) 108 (90 Base) MCG/ACT inhaler Inhale 2 puffs into the lungs every 6 (six) hours as needed for wheezing or shortness of breath. 02/05/18   Glenis Smoker, MD  albuterol (PROVENTIL) (2.5 MG/3ML) 0.083% nebulizer solution Take 3 mLs (2.5 mg total) by nebulization every 6 (six) hours as needed for wheezing or shortness of breath. 03/27/20   Faustino Congress, NP  alendronate (FOSAMAX) 70 MG tablet Take 1 tablet (70 mg total) by mouth every 7 (seven) days. Take with a full glass of water on an empty stomach. 02/18/20   Gifford Shave, MD  aspirin EC 81 MG tablet Take 1 tablet (81 mg total) by mouth daily. 08/13/15   Brayton Caves, PA-C  atorvastatin (LIPITOR) 20 MG tablet TAKE 1 TABLET BY MOUTH EVERY DAY 09/08/19   Gifford Shave, MD  benzonatate (TESSALON) 100  MG capsule Take 1-2 capsules (100-200 mg total) by mouth 3 (three) times daily as needed. 03/20/20   Jaynee Eagles, PA-C  doxycycline (VIBRAMYCIN) 100 MG capsule Take 1 capsule (100 mg total) by mouth 2 (two) times daily. 03/27/20   Faustino Congress, NP  DULoxetine (CYMBALTA) 30 MG capsule TAKE 2 CAPSULES(60 MG) BY MOUTH DAILY 12/03/19   Gifford Shave, MD  HYDROcodone-homatropine [facility]) 5-1.5 MG/5ML syrup Take 5 mLs by mouth every 6 (six) hours as needed for cough. 03/27/20   Faustino Congress, NP  LINZESS 290 MCG CAPS capsule Take 1 capsule by mouth daily. 02/23/20   [provider]  nortriptyline (PAMELOR) 25 MG capsule TAKE 1 CAPSULE BY MOUTH AT BREAKFAST AND LUNCH AND 2 CAPSULES AT BEDTIME 02/02/20   Gifford Shave, MD  predniSONE (DELTASONE) 20 MG tablet Take 2 tablets daily with breakfast. 03/20/20   Jaynee Eagles, PA-C  promethazine-dextromethorphan (PROMETHAZINE-DM) 6.25-15 MG/5ML syrup Take 5 mLs by mouth at bedtime as needed for cough. 03/20/20   Jaynee Eagles, PA-C  tiotropium (SPIRIVA) 18 MCG inhalation capsule Place 1 capsule (18 mcg total) into inhaler and inhale daily. 02/04/18   Glenis Smoker, MD  nortriptyline (PAMELOR) 25 MG capsule Take 2 capsules (50 mg total) by mouth at bedtime. 10/16/19   Zenia Resides, MD    Family History Family History  Problem Relation Age of Onset  . Heart disease Mother   . Thyroid disease Sister   . Melanoma Sister   . Diabetes Brother   . Epilepsy Son   . Early death Father   . Pulmonary embolism Father   . Thyroid disease Brother   . Colon cancer Neg Hx     Social History Social History   Tobacco Use  . Smoking status: Current Some Day Smoker    Packs/day: 0.50  . Smokeless tobacco: Never Used  Substance Use Topics  . Alcohol use: No  . Drug use: No     Allergies   Gabapentin and Sulfa antibiotics   Review of Systems Review of Systems   Physical Exam Triage Vital Signs ED Triage Vitals  Enc Vitals Group     BP 03/27/20 1124 116/69     Pulse Rate 03/27/20 1124 97     Resp 03/27/20 1124 17     Temp 03/27/20 1124 97.9 F (36.6 C)     Temp Source 03/27/20 1124 Oral     SpO2 03/27/20 1124 100 %     Weight 03/27/20 1121 114 lb (51.7 kg)     Height --      Head Circumference --      Peak Flow --      Pain Score 03/27/20 1121 0     Pain Loc --      Pain Edu? --      Excl. in [place]? --    No data found.  Updated Vital Signs BP 116/69 (BP Location: Left Arm)   Pulse 97   Temp 97.9 F (36.6 C) (Oral)   Resp 17   Wt 114 lb (51.7 kg)   SpO2 100%   BMI 22.26 kg/m    Visual Acuity Right Eye Distance:   Left Eye Distance:   Bilateral Distance:    Right Eye Near:   Left Eye Near:    Bilateral Near:     Physical Exam Vitals and nursing note reviewed.  Constitutional:      General: She is not in acute distress.    Appearance: Normal appearance. She  is well-developed and normal weight. She is ill-appearing.  HENT:     Head: Normocephalic and atraumatic.     Right Ear: Tympanic membrane normal.     Left Ear: Tympanic membrane normal.     Nose: Nose normal. No congestion or rhinorrhea.     Mouth/Throat:     Mouth: Mucous membranes are moist.     Pharynx: Oropharynx is clear.  Eyes:     Extraocular Movements: Extraocular movements intact.     Conjunctiva/sclera: Conjunctivae normal.     Pupils: Pupils are equal, round, and reactive to light.  Cardiovascular:     Rate and Rhythm: Normal rate and regular rhythm.     Heart sounds: Normal heart sounds. No murmur.  Pulmonary:     Effort: Pulmonary effort is normal. No respiratory distress.     Breath sounds: No stridor. Wheezing present. No rhonchi or rales.  Chest:     Chest wall: No tenderness.  Abdominal:     General: Bowel sounds are normal. There is no distension.     Palpations: Abdomen is soft. There is no mass.     Tenderness: There is no abdominal tenderness. There is no right CVA tenderness, left CVA tenderness, guarding or rebound.     Hernia: No hernia is present.  Musculoskeletal:        General: Normal range of motion.     Cervical back: Normal range of motion and neck supple.  Skin:    General: Skin is warm and dry.     Capillary Refill: Capillary refill takes less than 2 seconds.  Neurological:     General: No focal deficit present.     Mental Status: She is alert and oriented to person, place, and time.  Psychiatric:        Mood and Affect: Mood normal.        Behavior: Behavior normal.      UC Treatments / Results  Labs (all labs ordered are listed, but only  abnormal results are displayed) Labs Reviewed - No data to display  EKG   Radiology No results found.  Procedures Procedures (including critical care time)  Medications Ordered in UC Medications  methylPREDNISolone sodium succinate (SOLU-MEDROL) 125 mg/2 mL injection 60 mg (60 mg Intramuscular Given 03/27/20 1144)    Initial Impression / Assessment and Plan / UC Course  I have reviewed the triage vital signs and the nursing notes.  Pertinent labs & imaging results that were available during my care of the patient were reviewed by me and considered in my medical decision making (see chart for details).     COPD exacerbation: Cough, wheezing, shortness of breath x2 weeks.  Wheezing heard in lower lung bases.  No rales, crackles, rhonchi.  Unlikely Covid, as Covid results were negative less than 7 days ago.  Offered Covid testing again today, patient declines.  Patient instructed to continue home medication regimen.  Prescribe doxycycline 100 mg twice daily x7 days for COPD exacerbation.  60 mg Solu-Medrol IM given in office today.  Albuterol nebulizer solution prescribed and sent to patient's pharmacy also prescribed Hycodan cough syrup for coughing at night.  Patient instructed that if she is not feeling better in the next 2 to 3 days, to follow-up with our office or with primary care.  Patient instructed to follow-up at the ER for trouble swallowing, trouble breathing, high fever, other concerning symptoms. Final Clinical Impressions(s) / UC Diagnoses   Final diagnoses:  Cough  Bronchitis  Wheezing  COPD exacerbation (HCC)  Shortness of breath     Discharge Instructions     You are likely experiencing a COPD exacerbation.  It has been going on for about 2 weeks I am going to go ahead and do an antibiotic.  I sent in doxycycline for you to take twice a day for 7 days.  I also sent in a different cough medicine for you to take at night.  You received a dose of Solu-Medrol, which  is a steroid to help you open up and breathe a little better.  If you are not feeling better by Monday, you may follow-up with [place] or with primary care.  I would have you go to the ER for trouble swallowing, trouble breathing, high fever, other concerning symptoms.    ED Prescriptions    Medication Sig Dispense Auth. Provider   doxycycline (VIBRAMYCIN) 100 MG capsule Take 1 capsule (100 mg total) by mouth 2 (two) times daily. 14 capsule Faustino Congress, NP   HYDROcodone-homatropine (HYCODAN) 5-1.5 MG/5ML syrup Take 5 mLs by mouth every 6 (six) hours as needed for cough. 120 mL Faustino Congress, NP   albuterol (PROVENTIL) (2.5 MG/3ML) 0.083% nebulizer solution Take 3 mLs (2.5 mg total) by nebulization every 6 (six) hours as needed for wheezing or shortness of breath. 75 mL Faustino Congress, NP     PDMP not reviewed this encounter.   Faustino Congress, NP 03/30/20 2311

## 2020-03-31 ENCOUNTER — Other Ambulatory Visit: Payer: Self-pay | Admitting: Pharmacy Technician

## 2020-03-31 ENCOUNTER — Telehealth: Payer: Self-pay | Admitting: Pharmacist

## 2020-03-31 NOTE — Patient Outreach (Signed)
[facility]) Care Management  03/31/2020  CHASTA DESHPANDE Dec 12, 1961 [id number]  ADDENDUM   Incoming call from patient regarding patient assistance application(s) for Linzess with [employer], Spiriva with BI and Proventi HFA with [employer] , HIPAA identifiers verified.   Patient informs she received the applications and is in the process of filling them out and returning them to me. She had questions about LIS/Extra Help and Medicaid. Inbasket messages sent to [place] and Denyse [employer]ass for assistance.  Follow up:  Will route note to [place] for case closure if document(s) have not been received in the next 15 business days.  Romeo Zielinski P. Elion Hocker, [place]  [phone]

## 2020-03-31 NOTE — Patient Outreach (Signed)
[facility]) Care Management  03/31/2020  Candace Middleton Aug 28, 1961 [id number]    Unsuccessful call placed to patient regarding patient assistance application(s) for Linzess with [employer], Spiriva with BI and Proventil HFA with [employer] , HIPAA compliant voicemail left.   Was calling patient to inquire if she has received the applications.  Follow up:  Will follow with patient in 5-10 business days if call is not returned.  Garo Heidelberg P. Deverick Pruss, [place]  [phone]

## 2020-03-31 NOTE — Patient Outreach (Signed)
[facility]) Care Management  03/31/2020  Candace Middleton 03/11/1961 [id number]

## 2020-03-31 NOTE — Patient Outreach (Signed)
[facility]) Care Management  03/31/2020  Candace Middleton [birth year] [id number]   Received message inquiring about LIS. Called patient. HIPAA identifiers were obtained.  Completed an LIS application online with the patient over the phone.  She will be looking for the determination letter in the mail.  [name], PharmD, [place] Clinical Pharmacist [phone]

## 2020-04-05 ENCOUNTER — Other Ambulatory Visit: Payer: Self-pay | Admitting: Pharmacist

## 2020-04-05 NOTE — Patient Outreach (Signed)
[facility]) Care Management  04/05/2020  Candace Middleton Sep 16, 1961 [id number]   Patient called to let us know she was approved for LIS/Extra Help.  Now that she has LIS, she is not eligible for Patient Assistance.  [name], PharmD, [place] Clinical Pharmacist [phone]

## 2020-04-12 DIAGNOSIS — K5900 Constipation, unspecified: Secondary | ICD-10-CM | POA: Diagnosis not present

## 2020-05-23 ENCOUNTER — Other Ambulatory Visit: Payer: Self-pay | Admitting: Family Medicine

## 2020-06-16 ENCOUNTER — Telehealth: Payer: Self-pay | Admitting: Pharmacist

## 2020-06-16 DIAGNOSIS — Z87891 Personal history of nicotine dependence: Secondary | ICD-10-CM

## 2020-06-16 NOTE — Telephone Encounter (Signed)
-----   Message from Leavy Cella, [place] sent at 01/05/2020 11:22 AM EST ----- Regarding: 1 year tobacco cessation

## 2020-06-16 NOTE — Assessment & Plan Note (Signed)
Contacted patient to follow-up on quit to assess long-term success.    Patient reports continued abstinence and state that she know quitting smoking has improved her breathing modestly.  She also reports that she has almost fully recovered from her bronchitis ~ 2 months ago.     I encouraged continued abstinence.  Patient verbalized commitment to staying quit long-term.   A/P History of chronic tobacco abuse who is a good candidate for long-term tobacco cessation.   Continue support with phone f/u in ~ 6 months.

## 2020-06-16 NOTE — Telephone Encounter (Signed)
Contacted patient to follow-up on quit to assess long-term success.    Patient reports continued abstinence and state that she know quitting smoking has improved her breathing modestly.  She also reports that she has almost fully recovered from her bronchitis ~ 2 months ago.     I encouraged continued abstinence.  Patient verbalized commitment to staying quit long-term.   A/P History of chronic tobacco abuse who is a good candidate for long-term tobacco cessation.   Continue support with phone f/u in ~ 6 months.

## 2020-06-22 ENCOUNTER — Other Ambulatory Visit: Payer: Self-pay | Admitting: Family Medicine

## 2020-07-31 ENCOUNTER — Other Ambulatory Visit: Payer: Self-pay | Admitting: Family Medicine

## 2020-08-21 ENCOUNTER — Other Ambulatory Visit: Payer: Self-pay | Admitting: Family Medicine

## 2020-10-20 ENCOUNTER — Other Ambulatory Visit: Payer: Self-pay | Admitting: Family Medicine

## 2020-10-27 ENCOUNTER — Other Ambulatory Visit: Payer: Self-pay | Admitting: Family Medicine

## 2020-10-28 ENCOUNTER — Telehealth: Payer: Self-pay | Admitting: Pharmacist

## 2020-10-28 NOTE — Telephone Encounter (Signed)
Attempted to contact patient RE tobacco cessation f/u - Quit ~ 18 months prior.  Left HIPAA compliant message stating I would return call in the next 1-2 weeks.

## 2020-11-04 ENCOUNTER — Telehealth: Payer: Self-pay | Admitting: Pharmacist

## 2020-11-04 NOTE — Telephone Encounter (Signed)
Noted and agree. 

## 2020-11-04 NOTE — Telephone Encounter (Signed)
Contacted patient for long-term tobacco cessation follow-up.   Patient reports continued abstinence of > 18 months.  She reports multiple respiratory exacerbations in the last several month but reports she has recovered from both episodes.  She denied need for oxygen therapy with either exacerbation.     She verbalized confidence in remaining quit from tobacco long-term.   We agreed on a 34month - (2 year) phone follow-up.  I congratulated her on her success and encouraged her continued abstinence.

## 2020-11-19 ENCOUNTER — Other Ambulatory Visit: Payer: Self-pay | Admitting: Family Medicine

## 2021-02-03 DIAGNOSIS — Z1159 Encounter for screening for other viral diseases: Secondary | ICD-10-CM | POA: Diagnosis not present

## 2021-02-03 DIAGNOSIS — Z79899 Other long term (current) drug therapy: Secondary | ICD-10-CM | POA: Diagnosis not present

## 2021-02-06 DIAGNOSIS — Z124 Encounter for screening for malignant neoplasm of cervix: Secondary | ICD-10-CM | POA: Diagnosis not present

## 2021-02-06 DIAGNOSIS — N6459 Other signs and symptoms in breast: Secondary | ICD-10-CM | POA: Diagnosis not present

## 2021-02-06 DIAGNOSIS — J449 Chronic obstructive pulmonary disease, unspecified: Secondary | ICD-10-CM | POA: Diagnosis not present

## 2021-02-06 DIAGNOSIS — F17200 Nicotine dependence, unspecified, uncomplicated: Secondary | ICD-10-CM | POA: Diagnosis not present

## 2021-02-06 DIAGNOSIS — Z1211 Encounter for screening for malignant neoplasm of colon: Secondary | ICD-10-CM | POA: Diagnosis not present

## 2021-02-06 DIAGNOSIS — Z0000 Encounter for general adult medical examination without abnormal findings: Secondary | ICD-10-CM | POA: Diagnosis not present

## 2021-02-14 ENCOUNTER — Other Ambulatory Visit: Payer: Self-pay | Admitting: *Deleted

## 2021-02-14 DIAGNOSIS — N6459 Other signs and symptoms in breast: Secondary | ICD-10-CM

## 2021-02-17 DIAGNOSIS — Z1211 Encounter for screening for malignant neoplasm of colon: Secondary | ICD-10-CM | POA: Diagnosis not present

## 2021-02-24 ENCOUNTER — Other Ambulatory Visit: Payer: Self-pay | Admitting: *Deleted

## 2021-02-24 ENCOUNTER — Other Ambulatory Visit (HOSPITAL_COMMUNITY): Payer: Self-pay | Admitting: *Deleted

## 2021-02-24 DIAGNOSIS — F17200 Nicotine dependence, unspecified, uncomplicated: Secondary | ICD-10-CM

## 2021-02-24 LAB — COLOGUARD: COLOGUARD: NEGATIVE

## 2021-03-07 ENCOUNTER — Other Ambulatory Visit: Payer: Self-pay

## 2021-03-07 ENCOUNTER — Ambulatory Visit (HOSPITAL_COMMUNITY)
Admission: RE | Admit: 2021-03-07 | Discharge: 2021-03-07 | Disposition: A | Discharge status: Home / Self Care | Payer: Medicare HMO | Source: Ambulatory Visit | Attending: *Deleted | Admitting: *Deleted

## 2021-03-07 DIAGNOSIS — I700 Atherosclerosis of aorta: Secondary | ICD-10-CM | POA: Diagnosis not present

## 2021-03-07 DIAGNOSIS — Z122 Encounter for screening for malignant neoplasm of respiratory organs: Secondary | ICD-10-CM | POA: Insufficient documentation

## 2021-03-07 DIAGNOSIS — F17200 Nicotine dependence, unspecified, uncomplicated: Secondary | ICD-10-CM

## 2021-03-07 DIAGNOSIS — F17210 Nicotine dependence, cigarettes, uncomplicated: Secondary | ICD-10-CM | POA: Diagnosis not present

## 2021-03-07 DIAGNOSIS — I2510 Atherosclerotic heart disease of native coronary artery without angina pectoris: Secondary | ICD-10-CM | POA: Diagnosis not present

## 2021-03-07 DIAGNOSIS — J432 Centrilobular emphysema: Secondary | ICD-10-CM | POA: Insufficient documentation

## 2021-03-17 DIAGNOSIS — M545 Low back pain, unspecified: Secondary | ICD-10-CM | POA: Diagnosis not present

## 2021-03-17 DIAGNOSIS — I2510 Atherosclerotic heart disease of native coronary artery without angina pectoris: Secondary | ICD-10-CM | POA: Diagnosis not present

## 2021-03-19 ENCOUNTER — Other Ambulatory Visit: Payer: Self-pay | Admitting: Family Medicine

## 2021-03-29 ENCOUNTER — Ambulatory Visit
Admission: RE | Admit: 2021-03-29 | Discharge: 2021-03-29 | Disposition: A | Discharge status: Home / Self Care | Payer: Medicare HMO | Source: Ambulatory Visit | Attending: *Deleted | Admitting: *Deleted

## 2021-03-29 ENCOUNTER — Other Ambulatory Visit: Payer: Self-pay

## 2021-03-29 DIAGNOSIS — N6459 Other signs and symptoms in breast: Secondary | ICD-10-CM

## 2021-03-29 DIAGNOSIS — N6489 Other specified disorders of breast: Secondary | ICD-10-CM | POA: Diagnosis not present

## 2021-03-29 DIAGNOSIS — R922 Inconclusive mammogram: Secondary | ICD-10-CM | POA: Diagnosis not present

## 2021-03-30 DIAGNOSIS — G9050 Complex regional pain syndrome I, unspecified: Secondary | ICD-10-CM | POA: Diagnosis not present

## 2021-03-30 DIAGNOSIS — M5459 Other low back pain: Secondary | ICD-10-CM | POA: Diagnosis not present

## 2021-03-30 DIAGNOSIS — G894 Chronic pain syndrome: Secondary | ICD-10-CM | POA: Diagnosis not present

## 2021-03-30 DIAGNOSIS — Z79891 Long term (current) use of opiate analgesic: Secondary | ICD-10-CM | POA: Diagnosis not present

## 2021-03-31 ENCOUNTER — Telehealth: Payer: Self-pay | Admitting: Pharmacist

## 2021-03-31 DIAGNOSIS — F17200 Nicotine dependence, unspecified, uncomplicated: Secondary | ICD-10-CM

## 2021-03-31 DIAGNOSIS — Z87891 Personal history of nicotine dependence: Secondary | ICD-10-CM

## 2021-03-31 NOTE — Assessment & Plan Note (Signed)
Phone follow-up of long-term quit from tobacco.  Patient had quit for > 1 year.   Patient reports relapse to smoking ~ 7 cigs per day several weeks ago.   She expressed interest in quitting again and states she feels like she wants to try again in the next 4-6 weeks.   She denied any interest in drug therapy to assist with tobacco intake reduction/cessation.  She verbalized a strong commitment to try again in the near future.    We discussed need to reduce intake/quit to avoid long-term lung complications/exacerbations.   I plan to follow-up in ~ 4 weeks by phone to assess progress and encourage complete cessation.

## 2021-03-31 NOTE — Telephone Encounter (Signed)
Phone follow-up of long-term quit from tobacco.  Patient had quit for > 1 year.   Patient reports relapse to smoking ~ 7 cigs per day several weeks ago.   She expressed interest in quitting again and states she feels like she wants to try again in the next 4-6 weeks.   She denied any interest in drug therapy to assist with tobacco intake reduction/cessation.  She verbalized a strong commitment to try again in the near future.    We discussed need to reduce intake/quit to avoid long-term lung complications/exacerbations.   I plan to follow-up in ~ 4 weeks by phone to assess progress and encourage complete cessation.

## 2021-04-01 NOTE — Telephone Encounter (Signed)
Noted and agree. 

## 2021-04-13 DIAGNOSIS — M5137 Other intervertebral disc degeneration, lumbosacral region: Secondary | ICD-10-CM | POA: Diagnosis not present

## 2021-04-13 DIAGNOSIS — M5136 Other intervertebral disc degeneration, lumbar region: Secondary | ICD-10-CM | POA: Diagnosis not present

## 2021-04-13 DIAGNOSIS — M47816 Spondylosis without myelopathy or radiculopathy, lumbar region: Secondary | ICD-10-CM | POA: Diagnosis not present

## 2021-04-14 ENCOUNTER — Ambulatory Visit: Payer: Medicare HMO | Admitting: Cardiology

## 2021-04-14 ENCOUNTER — Other Ambulatory Visit: Payer: Self-pay

## 2021-04-14 ENCOUNTER — Encounter: Payer: Self-pay | Admitting: Cardiology

## 2021-04-14 ENCOUNTER — Other Ambulatory Visit: Payer: Self-pay | Admitting: Cardiology

## 2021-04-14 VITALS — BP 114/75 | HR 87 | Temp 97.8°F | Resp 17 | Ht 60.0 in | Wt 103.4 lb

## 2021-04-14 DIAGNOSIS — I2510 Atherosclerotic heart disease of native coronary artery without angina pectoris: Secondary | ICD-10-CM | POA: Diagnosis not present

## 2021-04-14 DIAGNOSIS — R072 Precordial pain: Secondary | ICD-10-CM | POA: Diagnosis not present

## 2021-04-14 DIAGNOSIS — F17200 Nicotine dependence, unspecified, uncomplicated: Secondary | ICD-10-CM

## 2021-04-14 DIAGNOSIS — J439 Emphysema, unspecified: Secondary | ICD-10-CM | POA: Diagnosis not present

## 2021-04-14 DIAGNOSIS — I2584 Coronary atherosclerosis due to calcified coronary lesion: Secondary | ICD-10-CM | POA: Diagnosis not present

## 2021-04-14 MED ORDER — METOPROLOL SUCCINATE ER 25 MG PO TB24: 12.5000 mg | ORAL_TABLET | Freq: Every morning | ORAL | 0 refills | Status: DC

## 2021-04-14 MED ORDER — AMLODIPINE BESYLATE 5 MG PO TABS: 5.0000 mg | ORAL_TABLET | Freq: Every evening | ORAL | 0 refills | Status: DC

## 2021-04-14 MED ORDER — NITROGLYCERIN 0.4 MG SL SUBL: 0.4000 mg | SUBLINGUAL_TABLET | SUBLINGUAL | 0 refills | Status: DC | PRN

## 2021-04-14 NOTE — Progress Notes (Signed)
Date:  04/14/2021   ID:  Candace Middleton, DOB 1961/12/16, MRN [id number]  PCP:  Marva Panda, NP  Cardiologist:  Tessa Lerner, DO, [facility] (established care 04/14/2021) Former Cardiology Providers: Dr. Valera Castle   REASON FOR CONSULT: Atherosclerotic heart disease of the native coronary arteries without angina pectoris  REQUESTING PHYSICIAN:  Marva Panda, NP [address] [place],  [place] [address]  Chief Complaint  Patient presents with  . New Patient (Initial Visit)    Ref by Marva Panda  . Chest Pain    HPI  Candace Middleton is a 60 y.o. female who presents to the office with a chief complaint of " chest pain or shortness of breath." Patient's past medical history and cardiovascular risk factors include: Aortic atherosclerosis, calcified coronary atherosclerosis involving the RCA (not gated CT 03/07/2021), history of coronary vasospasm (cath 2016), COPD, family history of heart disease, smoking 0.5ppd and postmenopausal female.  She is referred to the office at the request of Marva Panda, NP for evaluation of  Atherosclerotic heart disease of the native coronary arteries without angina pectoris.  Chest pain: Patient states that she has been having chest discomfort when she overexerts herself such as carrying heavy groceries to and from home.  The symptoms happen every week, discomfort is located substernally, pressure/heaviness like sensation, 4 out of 10 in intensity, lasts for few minutes, resolves with rest at times.  Patient not taking any medications for her discomfort.  Her last heart catheterization was in 2016.  Patient recently had a CT of the lungs for cancer screening and was noted to have calcified coronary atherosclerosis in the RCA.  Left heart catheterization in 2016 noted coronary vasospasms; however, patient states his symptoms now are different than what she had back in 2016.  Shortness of breath: Present over the last several  years but progressively worsening.  Can be present at rest or with effort related activities.  He denies any symptoms of heart failure such as orthopnea, paroxysmal nocturnal dyspnea, extremity swelling.  She continues to smoke half a pack per day and has 40-year pack history of smoking.  FUNCTIONAL STATUS: No structured exercise program or daily routine.   ALLERGIES: Allergies  Allergen Reactions  . Gabapentin Hives    Patient reports facial swelling, hives, facial involvement   . Sulfa Antibiotics Anaphylaxis    Onset at age 89, "almost died because I stopped breathing."    MEDICATION LIST PRIOR TO VISIT: Current Meds  Medication Sig  . alendronate (FOSAMAX) 70 MG tablet TAKE 1 TABLET(70 MG) BY MOUTH EVERY 7 DAYS WITH A FULL GLASS OF WATER AND ON AN EMPTY STOMACH  . aspirin EC 81 MG tablet Take 1 tablet (81 mg total) by mouth daily.  Marland Kitchen atorvastatin (LIPITOR) 20 MG tablet TAKE 1 TABLET BY MOUTH EVERY DAY  . calcium citrate (CALCITRATE - DOSED IN MG ELEMENTAL CALCIUM) 950 (200 Ca) MG tablet Take 600 mg by mouth daily. Two tablets each morning  . cyclobenzaprine (FLEXERIL) 10 MG tablet Take 1 tablet by mouth in the morning and at bedtime.  . DULoxetine (CYMBALTA) 30 MG capsule TAKE 2 CAPSULES(60 MG) BY MOUTH DAILY  . LINZESS 290 MCG CAPS capsule Take 1 capsule by mouth daily.  . nitroGLYCERIN (NITROSTAT) 0.4 MG SL tablet Place 1 tablet (0.4 mg total) under the tongue every 5 (five) minutes as needed for chest pain. If you require more than two tablets five minutes apart go to the nearest ER via EMS.  Marland Kitchen  oxyCODONE-acetaminophen (PERCOCET/ROXICET) 5-325 MG tablet Take 1 tablet by mouth as needed.  . tiotropium (SPIRIVA) 18 MCG inhalation capsule Place 1 capsule (18 mcg total) into inhaler and inhale daily.  . [DISCONTINUED] amLODipine (NORVASC) 5 MG tablet Take 1 tablet (5 mg total) by mouth every evening.  . [DISCONTINUED] metoprolol succinate (TOPROL XL) 25 MG 24 hr tablet Take 0.5 tablets  (12.5 mg total) by mouth every morning.     PAST MEDICAL HISTORY: Past Medical History:  Diagnosis Date  . Allergy   . Anxiety   . Arthritis   . Cancer (HCC)    bladder ca  . COPD (chronic obstructive pulmonary disease) (HCC)   . Depression   . GERD (gastroesophageal reflux disease)   . Hyperlipidemia   . Neuromuscular disorder (HCC) 2014   RSD - nerve blocks in back for treatment   . Osteoporosis 09/05/2019    PAST SURGICAL HISTORY: Past Surgical History:  Procedure Laterality Date  . bladder cancer  2006  . BLADDER TUMOR EXCISION  12/25/2004  . BREAST SURGERY  "long time ago"   lumpectomy   . CARDIAC CATHETERIZATION N/A 09/07/2015   Procedure: Left Heart Cath and Coronary Angiography;  Surgeon: Corky Crafts, MD;  Location[facility] INVASIVE CV LAB;  Service: Cardiovascular;  Laterality: N/A;  . CESAREAN SECTION  1982  . ENDOMETRIAL ABLATION  12/25/1993    FAMILY HISTORY: The patient family history includes Diabetes in her brother; Early death in her father; Epilepsy in her son; Heart disease in her mother; Melanoma in her sister; Pulmonary embolism in her father; Thyroid disease in her brother and sister.  SOCIAL HISTORY:  The patient  reports that she has been smoking cigarettes. She has a 15.00 pack-year smoking history. She has never used smokeless tobacco. She reports that she does not drink alcohol and does not use drugs.  REVIEW OF SYSTEMS: Review of Systems  Constitutional: Negative for chills and fever.  HENT: Negative for hoarse voice and nosebleeds.   Eyes: Negative for discharge, double vision and pain.  Cardiovascular: Positive for chest pain and dyspnea on exertion. Negative for claudication, leg swelling, near-syncope, orthopnea, palpitations, paroxysmal nocturnal dyspnea and syncope.  Respiratory: Positive for shortness of breath. Negative for hemoptysis.   Musculoskeletal: Negative for muscle cramps and myalgias.  Gastrointestinal: Negative for  abdominal pain, constipation, diarrhea, hematemesis, hematochezia, melena, nausea and vomiting.  Neurological: Negative for dizziness and light-headedness.    PHYSICAL EXAM: Vitals with BMI 04/14/2021 03/27/2020 03/20/2020  Height 5\' 0"  - -  Weight 103 lbs 6 oz 114 lbs -  BMI 20.19 - -  Systolic 114 116 829  Diastolic 75 69 75  Pulse 87 97 92   CONSTITUTIONAL: Well-developed and well-nourished. No acute distress.  SKIN: Skin is warm and dry. No rash noted. No cyanosis. No pallor. No jaundice HEAD: Normocephalic and atraumatic.  EYES: No scleral icterus MOUTH/THROAT: Moist oral membranes.  NECK: No JVD present. No thyromegaly noted. No carotid bruits  LYMPHATIC: No visible cervical adenopathy.  CHEST Normal respiratory effort. No intercostal retractions  LUNGS: Decreased breath sounds bilaterally. No stridor. No wheezes. No rales.  CARDIOVASCULAR: Regular, positive S1-S2, no murmurs rubs or gallops appreciated ABDOMINAL: Nonobese, soft, nontender, nondistended, positive bowel sounds in all 4 quadrant.  No apparent ascites.  EXTREMITIES: No peripheral edema  HEMATOLOGIC: No significant bruising NEUROLOGIC: Oriented to person, place, and time. Nonfocal. Normal muscle tone.  PSYCHIATRIC: Normal mood and affect. Normal behavior. Cooperative  CARDIAC DATABASE: EKG: 04/14/2021: Normal sinus rhythm,  71 bpm, normal axis, without underlying injury pattern.  Echocardiogram: No results found for this or any previous visit from the past 1095 days.   Stress Testing: No results found for this or any previous visit from the past 1095 days.  Heart Catheterization: 09/07/2015:  The left ventricular systolic function is normal.  Mild nonobstructive CAD.  Left coronary system does vasodilate with intrcoronary nitroglycerin.   Continue aggressive preventive therapy.  She could have coronary vasospasm.  Consider adjusting medical therapy accordingly.  Will give a prescription of SL  NTG.  LABORATORY DATA: CBC Latest Ref Rng & Units 09/27/2018 09/25/2018 02/04/2018  WBC 3.4 - 10.8 x10E3/uL 6.1 8.2 7.1  Hemoglobin 11.1 - 15.9 g/dL 16.1 09.6 04.5  Hematocrit 34.0 - 46.6 % 46.9(H) 45.6 46.7(H)  Platelets 150 - 450 x10E3/uL 307 331 351    CMP Latest Ref Rng & Units 09/27/2018 09/25/2018 02/04/2018  Glucose 65 - 99 mg/dL 409(W) 96 85  BUN 6 - 24 mg/dL 6 9 9   Creatinine 0.57 - 1.00 mg/dL 1.19 1.47 8.29  Sodium 134 - 144 mmol/L 140 139 140  Potassium 3.5 - 5.2 mmol/L 4.8 4.4 4.8  Chloride 96 - 106 mmol/L 101 99 100  CO2 20 - 29 mmol/L 24 25 24   Calcium 8.7 - 10.2 mg/dL 9.6 9.7 56.2  Total Protein 6.0 - 8.5 g/dL - - 6.6  Total Bilirubin 0.0 - 1.2 mg/dL - - 0.3  Alkaline Phos 39 - 117 IU/L - - 71  AST 0 - 40 IU/L - - 13  ALT 0 - 32 IU/L - - 15    Lipid Panel  Lab Results  Component Value Date   CHOL 181 09/25/2018   HDL 68 09/25/2018   LDLCALC 97 09/25/2018   TRIG 78 09/25/2018   CHOLHDL 2.7 09/25/2018    No components found for: NTPROBNP No results for input(s): PROBNP in the last 8760 hours. No results for input(s): TSH in the last 8760 hours.  BMP No results for input(s): NA, K, CL, CO2, GLUCOSE, BUN, CREATININE, CALCIUM, GFRNONAA, GFRAA in the last 8760 hours.  HEMOGLOBIN A1C No results found for: HGBA1C, MPG  External Labs: Collected: 02/04/2021 provided by PCP Creatinine 0.78 mg/dL. eGFR: 82 mL/min per 1.73 m Sodium 140, potassium 5.7 Lipid profile: Total cholesterol 164, triglycerides 50, HDL 70, LDL 82, non-HDL 94 Hemoglobin A1c: 5.9 TSH: 2.48 Hemoglobin 16.2 g/dL, hematocrit 13%  IMPRESSION:    ICD-10-CM   1. Coronary atherosclerosis due to calcified coronary lesion  I25.10 EKG 12-Lead   I25.84 PCV MYOCARDIAL PERFUSION WO LEXISCAN  2. Precordial pain  R07.2 EKG 12-Lead    PCV ECHOCARDIOGRAM COMPLETE    PCV MYOCARDIAL PERFUSION WO LEXISCAN    nitroGLYCERIN (NITROSTAT) 0.4 MG SL tablet    DISCONTINUED: metoprolol succinate (TOPROL XL) 25  MG 24 hr tablet    DISCONTINUED: amLODipine (NORVASC) 5 MG tablet  3. Pulmonary emphysema, unspecified emphysema type (HCC)  J43.9   4. Smoking  F17.200      RECOMMENDATIONS: Candace Middleton is a 60 y.o. female whose past medical history and cardiac risk factors include: Aortic atherosclerosis, calcified coronary atherosclerosis involving the RCA (not gated CT 03/07/2021), history of coronary vasospasm (cath 2016), COPD, family history of heart disease, smoking 0.5ppd and postmenopausal female.  Patient history of coronary vasospasm and given her other chronic comorbid conditions I suspect she has microvascular dysfunction that may be contributing to her symptoms.  However, given the recent symptoms of precordial chest pain which  suggest to be cardiac in etiology and coronary artery atherosclerosis noted on non gated CT study the shared decision was to proceed with ischemic evaluation.  Echocardiogram will be ordered to evaluate for structural heart disease and left ventricular systolic function.  Exercise nuclear stress test to evaluate for reversible ischemia and functional status.  EKG shows normal sinus rhythm without underlying ischemia or injury pattern  Outside labs independently reviewed as a part of this consultation.  Given her history of vasospasms we will start Toprol-XL 25 mg p.o. a.m. and amlodipine 5 mg p.o. every p.m.  Tobacco cessation counseling: Currently smoking 0.5 packs/day   Patient was informed of the dangers of tobacco abuse including stroke, cancer, and MI, as well as benefits of tobacco cessation. Patient is not willing to quit at this time. Approximately 7 mins were spent counseling patient cessation techniques. We discussed various methods to help quit smoking, including deciding on a date to quit, joining a support group, pharmacological agents- nicotine gum/patch/lozenges.  I will reassess her progress at the next follow-up visit  FINAL MEDICATION LIST  END OF ENCOUNTER: Meds ordered this encounter  Medications  . DISCONTD: metoprolol succinate (TOPROL XL) 25 MG 24 hr tablet    Sig: Take 0.5 tablets (12.5 mg total) by mouth every morning.    Dispense:  15 tablet    Refill:  0  . DISCONTD: amLODipine (NORVASC) 5 MG tablet    Sig: Take 1 tablet (5 mg total) by mouth every evening.    Dispense:  30 tablet    Refill:  0  . nitroGLYCERIN (NITROSTAT) 0.4 MG SL tablet    Sig: Place 1 tablet (0.4 mg total) under the tongue every 5 (five) minutes as needed for chest pain. If you require more than two tablets five minutes apart go to the nearest ER via EMS.    Dispense:  30 tablet    Refill:  0    Medications Discontinued During This Encounter  Medication Reason  . albuterol (PROVENTIL HFA;VENTOLIN HFA) 108 (90 Base) MCG/ACT inhaler Error  . albuterol (PROVENTIL) (2.5 MG/3ML) 0.083% nebulizer solution Error  . doxycycline (VIBRAMYCIN) 100 MG capsule Error  . HYDROcodone-homatropine (HYCODAN) 5-1.5 MG/5ML syrup Error  . nortriptyline (PAMELOR) 25 MG capsule Error  . promethazine-dextromethorphan (PROMETHAZINE-DM) 6.25-15 MG/5ML syrup Error  . predniSONE (DELTASONE) 20 MG tablet Error  . benzonatate (TESSALON) 100 MG capsule Error     Current Outpatient Medications:  .  alendronate (FOSAMAX) 70 MG tablet, TAKE 1 TABLET(70 MG) BY MOUTH EVERY 7 DAYS WITH A FULL GLASS OF WATER AND ON AN EMPTY STOMACH, Disp: 4 tablet, Rfl: 5 .  aspirin EC 81 MG tablet, Take 1 tablet (81 mg total) by mouth daily., Disp: 30 tablet, Rfl: 3 .  atorvastatin (LIPITOR) 20 MG tablet, TAKE 1 TABLET BY MOUTH EVERY DAY, Disp: 90 tablet, Rfl: 3 .  calcium citrate (CALCITRATE - DOSED IN MG ELEMENTAL CALCIUM) 950 (200 Ca) MG tablet, Take 600 mg by mouth daily. Two tablets each morning, Disp: , Rfl:  .  cyclobenzaprine (FLEXERIL) 10 MG tablet, Take 1 tablet by mouth in the morning and at bedtime., Disp: , Rfl:  .  DULoxetine (CYMBALTA) 30 MG capsule, TAKE 2 CAPSULES(60 MG) BY  MOUTH DAILY, Disp: 60 capsule, Rfl: 4 .  LINZESS 290 MCG CAPS capsule, Take 1 capsule by mouth daily., Disp: , Rfl:  .  nitroGLYCERIN (NITROSTAT) 0.4 MG SL tablet, Place 1 tablet (0.4 mg total) under the tongue every 5 (five) minutes  as needed for chest pain. If you require more than two tablets five minutes apart go to the nearest ER via EMS., Disp: 30 tablet, Rfl: 0 .  oxyCODONE-acetaminophen (PERCOCET/ROXICET) 5-325 MG tablet, Take 1 tablet by mouth as needed., Disp: , Rfl:  .  tiotropium (SPIRIVA) 18 MCG inhalation capsule, Place 1 capsule (18 mcg total) into inhaler and inhale daily., Disp: 30 capsule, Rfl: 12 .  amLODipine (NORVASC) 5 MG tablet, TAKE 1 TABLET(5 MG) BY MOUTH EVERY EVENING, Disp: 90 tablet, Rfl: 0 .  metoprolol succinate (TOPROL-XL) 25 MG 24 hr tablet, TAKE 1/2 TABLET(12.5 MG) BY MOUTH EVERY MORNING, Disp: 45 tablet, Rfl: 0  Orders Placed This Encounter  Procedures  . PCV MYOCARDIAL PERFUSION WO LEXISCAN  . EKG 12-Lead  . PCV ECHOCARDIOGRAM COMPLETE    There are no Patient Instructions on file for this visit.   --Continue cardiac medications as reconciled in final medication list. --Return in about 6 weeks (around 05/26/2021) for Follow up, Chest pain, Review test results. Or sooner if needed. --Continue follow-up with your primary care physician regarding the management of your other chronic comorbid conditions.  Patient's questions and concerns were addressed to her satisfaction. She voices understanding of the instructions provided during this encounter.   This note was created using a voice recognition software as a result there may be grammatical errors inadvertently enclosed that do not reflect the nature of this encounter. Every attempt is made to correct such errors.  Tessa Lerner, [place], [facility]  Pager: [phone] Office: [phone]

## 2021-04-21 ENCOUNTER — Ambulatory Visit: Payer: Medicare HMO

## 2021-04-21 ENCOUNTER — Other Ambulatory Visit: Payer: Self-pay

## 2021-04-21 DIAGNOSIS — R072 Precordial pain: Secondary | ICD-10-CM

## 2021-04-25 DIAGNOSIS — K5900 Constipation, unspecified: Secondary | ICD-10-CM | POA: Diagnosis not present

## 2021-04-27 ENCOUNTER — Ambulatory Visit: Payer: Medicare HMO

## 2021-04-27 ENCOUNTER — Other Ambulatory Visit: Payer: Self-pay

## 2021-04-27 DIAGNOSIS — I2510 Atherosclerotic heart disease of native coronary artery without angina pectoris: Secondary | ICD-10-CM

## 2021-04-27 DIAGNOSIS — R072 Precordial pain: Secondary | ICD-10-CM | POA: Diagnosis not present

## 2021-04-27 DIAGNOSIS — I2584 Coronary atherosclerosis due to calcified coronary lesion: Secondary | ICD-10-CM | POA: Diagnosis not present

## 2021-04-28 DIAGNOSIS — M5459 Other low back pain: Secondary | ICD-10-CM | POA: Diagnosis not present

## 2021-04-28 DIAGNOSIS — G894 Chronic pain syndrome: Secondary | ICD-10-CM | POA: Diagnosis not present

## 2021-04-28 DIAGNOSIS — G9050 Complex regional pain syndrome I, unspecified: Secondary | ICD-10-CM | POA: Diagnosis not present

## 2021-05-08 ENCOUNTER — Other Ambulatory Visit: Payer: Self-pay | Admitting: Cardiology

## 2021-05-08 DIAGNOSIS — R072 Precordial pain: Secondary | ICD-10-CM

## 2021-05-19 ENCOUNTER — Telehealth: Payer: Self-pay | Admitting: Pharmacist

## 2021-05-19 NOTE — Telephone Encounter (Signed)
-----   Message from Leavy Cella, [place] sent at 03/31/2021  2:50 PM EDT ----- Regarding: Tobacco intake reduction following relapse

## 2021-05-19 NOTE — Telephone Encounter (Signed)
Attempted to contact patient RE tobacco cessation or intake reduction in patient who had previously quit smoking for > 1 year and then relapsed.   No answer after multiple attempts.   I will plan to call 1 additional time in 1 week.

## 2021-05-26 DIAGNOSIS — G894 Chronic pain syndrome: Secondary | ICD-10-CM | POA: Diagnosis not present

## 2021-05-26 DIAGNOSIS — M47817 Spondylosis without myelopathy or radiculopathy, lumbosacral region: Secondary | ICD-10-CM | POA: Diagnosis not present

## 2021-05-26 DIAGNOSIS — M5459 Other low back pain: Secondary | ICD-10-CM | POA: Diagnosis not present

## 2021-05-26 DIAGNOSIS — G9050 Complex regional pain syndrome I, unspecified: Secondary | ICD-10-CM | POA: Diagnosis not present

## 2021-06-06 ENCOUNTER — Other Ambulatory Visit: Payer: Self-pay

## 2021-06-06 ENCOUNTER — Ambulatory Visit: Payer: Medicare HMO | Admitting: Cardiology

## 2021-06-06 ENCOUNTER — Encounter: Payer: Self-pay | Admitting: Cardiology

## 2021-06-06 VITALS — BP 106/67 | HR 86 | Temp 98.7°F | Resp 16 | Ht 60.0 in | Wt 104.0 lb

## 2021-06-06 DIAGNOSIS — I2584 Coronary atherosclerosis due to calcified coronary lesion: Secondary | ICD-10-CM

## 2021-06-06 DIAGNOSIS — I351 Nonrheumatic aortic (valve) insufficiency: Secondary | ICD-10-CM

## 2021-06-06 DIAGNOSIS — R072 Precordial pain: Secondary | ICD-10-CM | POA: Diagnosis not present

## 2021-06-06 DIAGNOSIS — F17200 Nicotine dependence, unspecified, uncomplicated: Secondary | ICD-10-CM

## 2021-06-06 DIAGNOSIS — I2510 Atherosclerotic heart disease of native coronary artery without angina pectoris: Secondary | ICD-10-CM | POA: Diagnosis not present

## 2021-06-06 DIAGNOSIS — F17210 Nicotine dependence, cigarettes, uncomplicated: Secondary | ICD-10-CM | POA: Diagnosis not present

## 2021-06-06 MED ORDER — AMLODIPINE BESYLATE 5 MG PO TABS
5.0000 mg | ORAL_TABLET | Freq: Every evening | ORAL | 1 refills | Status: DC
Start: 2021-06-06 — End: 2021-10-10

## 2021-06-06 NOTE — Progress Notes (Signed)
ID:  Candace Middleton, DOB November 16, 1961, MRN [id number]  PCP:  Marva Panda, NP  Cardiologist:  Tessa Lerner, DO, [facility] (established care 04/14/2021) Former Cardiology Providers: Dr. Valera Castle   Date: 06/06/21 Last Office Visit: 04/14/2021   Chief Complaint  Patient presents with   Chest Pain   Results    lexi   Follow-up    6 weeks    HPI  Candace Middleton is a 60 y.o. female who presents to the office with a chief complaint of " 6 weeks follow-up for reevaluation of chest pain and discuss test results." Patient's past medical history and cardiovascular risk factors include: Aortic atherosclerosis, calcified coronary atherosclerosis involving the RCA (not gated CT 03/07/2021), history of coronary vasospasm (cath 2016), COPD, family history of heart disease, smoking 0.5ppd and postmenopausal female.  She is referred to the office at the request of Marva Panda, NP for evaluation of  Atherosclerotic heart disease of the native coronary arteries without angina pectoris.  She was referred to the office again after having a CT scan to screen for lung cancer and was noted to have coronary calcified atherosclerosis in the RCA distribution.  In addition to that she was experiencing chest discomfort which was different compared to her prior episode in 2016.  After discussing the risks, benefits, and alternatives to diagnostic testing that shared decision was to proceed with an echocardiogram and stress test.  Since last office visit patient states that her chest pain and shortness of breath both have improved significantly.  She has had 1 episode on June 03, 2021 when she woke up in the middle of the night with chest discomfort and required 2 nitro tablets 5 minutes apart for symptoms to resolve.  Otherwise no recurrence of angina pectoris.  Patient states that she is also reduced her smoking to no more than 7 cigarettes/day and has noted significant improvement in her shortness  of breath.  She denies any heart failure symptoms.    Patient is made aware that the echocardiogram notes preserved LVEF, normal diastolic function, and moderate AR.  An exercise nuclear stress test notes good functional capacity for age, stress-ECG negative for ischemia and normal myocardial perfusion.    FUNCTIONAL STATUS: No structured exercise program or daily routine.   ALLERGIES: Allergies  Allergen Reactions   Gabapentin Hives    Patient reports facial swelling, hives, facial involvement    Sulfa Antibiotics Anaphylaxis    Onset at age 25, "almost died because I stopped breathing."    MEDICATION LIST PRIOR TO VISIT: Current Meds  Medication Sig   alendronate (FOSAMAX) 70 MG tablet TAKE 1 TABLET(70 MG) BY MOUTH EVERY 7 DAYS WITH A FULL GLASS OF WATER AND ON AN EMPTY STOMACH   aspirin EC 81 MG tablet Take 1 tablet (81 mg total) by mouth daily.   atorvastatin (LIPITOR) 20 MG tablet TAKE 1 TABLET BY MOUTH EVERY DAY   calcium citrate (CALCITRATE - DOSED IN MG ELEMENTAL CALCIUM) 950 (200 Ca) MG tablet Take 600 mg by mouth daily. Two tablets each morning   cyclobenzaprine (FLEXERIL) 10 MG tablet Take 1 tablet by mouth in the morning and at bedtime.   DULoxetine (CYMBALTA) 30 MG capsule TAKE 2 CAPSULES(60 MG) BY MOUTH DAILY   LINZESS 290 MCG CAPS capsule Take 1 capsule by mouth daily.   metoprolol succinate (TOPROL-XL) 25 MG 24 hr tablet TAKE 1/2 TABLET(12.5 MG) BY MOUTH EVERY MORNING   nitroGLYCERIN (NITROSTAT) 0.4 MG SL tablet PLACE 1 TABLET UNDER  TONGUE EVERY 5 MINUTES AS NEEDED FOR CHEST PAIN.IF MORE THAN 2 TABLETS ARE REQUIRED CALL EMS AND GO TO ER.   oxyCODONE-acetaminophen (PERCOCET/ROXICET) 5-325 MG tablet Take 1 tablet by mouth as needed.   tiotropium (SPIRIVA) 18 MCG inhalation capsule Place 1 capsule (18 mcg total) into inhaler and inhale daily.   [DISCONTINUED] amLODipine (NORVASC) 5 MG tablet TAKE 1 TABLET(5 MG) BY MOUTH EVERY EVENING     PAST MEDICAL HISTORY: Past  Medical History:  Diagnosis Date   Allergy    Anxiety    Arthritis    Cancer (HCC)    bladder ca   COPD (chronic obstructive pulmonary disease) (HCC)    Depression    GERD (gastroesophageal reflux disease)    Hyperlipidemia    Neuromuscular disorder (HCC) 2014   RSD - nerve blocks in back for treatment    Osteoporosis 09/05/2019    PAST SURGICAL HISTORY: Past Surgical History:  Procedure Laterality Date   bladder cancer  2006   BLADDER TUMOR EXCISION  12/25/2004   BREAST SURGERY  "long time ago"   lumpectomy    CARDIAC CATHETERIZATION N/A 09/07/2015   Procedure: Left Heart Cath and Coronary Angiography;  Surgeon: Corky Crafts, MD;  Location: [facility] INVASIVE CV LAB;  Service: Cardiovascular;  Laterality: N/A;   CESAREAN SECTION  1982   ENDOMETRIAL ABLATION  12/25/1993    FAMILY HISTORY: The patient family history includes Diabetes in her brother; Early death in her father; Epilepsy in her son; Heart disease in her mother; Melanoma in her sister; Pulmonary embolism in her father; Thyroid disease in her brother and sister.  SOCIAL HISTORY:  The patient  reports that she has been smoking cigarettes. She has a 15.00 pack-year smoking history. She has never used smokeless tobacco. She reports that she does not drink alcohol and does not use drugs.  REVIEW OF SYSTEMS: Review of Systems  Constitutional: Negative for chills and fever.  HENT:  Negative for hoarse voice and nosebleeds.   Eyes:  Negative for discharge, double vision and pain.  Cardiovascular:  Positive for chest pain (improved, 1 episode since last visit). Negative for claudication, dyspnea on exertion, leg swelling, near-syncope, orthopnea, palpitations, paroxysmal nocturnal dyspnea and syncope.  Respiratory:  Negative for hemoptysis and shortness of breath.   Musculoskeletal:  Negative for muscle cramps and myalgias.  Gastrointestinal:  Negative for abdominal pain, constipation, diarrhea, hematemesis, hematochezia,  melena, nausea and vomiting.  Neurological:  Negative for dizziness and light-headedness.   PHYSICAL EXAM: Vitals with BMI 06/06/2021 04/14/2021 03/27/2020  Height 5\' 0"  5\' 0"  -  Weight 104 lbs 103 lbs 6 oz 114 lbs  BMI 20.31 20.19 -  Systolic 106 114 295  Diastolic 67 75 69  Pulse 86 87 97   CONSTITUTIONAL: Well-developed and well-nourished. No acute distress.  SKIN: Skin is warm and dry. No rash noted. No cyanosis. No pallor. No jaundice HEAD: Normocephalic and atraumatic.  EYES: No scleral icterus MOUTH/THROAT: Moist oral membranes.  NECK: No JVD present. No thyromegaly noted. No carotid bruits  LYMPHATIC: No visible cervical adenopathy.  CHEST Normal respiratory effort. No intercostal retractions  LUNGS: Decreased breath sounds bilaterally. No stridor. No wheezes. No rales.  CARDIOVASCULAR: Regular, positive S1-S2, no murmurs rubs or gallops appreciated ABDOMINAL: Nonobese, soft, nontender, nondistended, positive bowel sounds in all 4 quadrant.  No apparent ascites.  EXTREMITIES: No peripheral edema  HEMATOLOGIC: No significant bruising NEUROLOGIC: Oriented to person, place, and time. Nonfocal. Normal muscle tone.  PSYCHIATRIC: Normal mood and affect.  Normal behavior. Cooperative  CARDIAC DATABASE: EKG: 04/14/2021: Normal sinus rhythm, 71 bpm, normal axis, without underlying injury pattern.  Echocardiogram: 04/21/2021: Normal LV systolic function with visual EF 55-60%. The left ventricle is normal thickness Left ventricle cavity is normal in size. Normal global wall motion. Normal diastolic filling pattern, normal LAP. Calculated EF 57%. Left atrial cavity is normal in size. The interatrial Septum is thin and mobile but appears to be intact by 2D and CF Doppler interrogation. Moderate (Grade II) aortic regurgitation. No prior study for comparison.   Stress Testing: Exercise Myoview stress test 04/27/2021: 1 Day Rest/Stress Protocol. Exercise time 8 minutes 44seconds on Bruce  protocol, achieved 10.16 METS, and 86% of age-predicted maximum heart rate.  Stress ECG negative for ischemia.  Normal myocardial perfusion without evidence of reversible ischemia or prior infarct. Stress LVEF 64%. All segments of the LV demonstrate normal wall motion andthickening. No prior studies available for comparison.  Heart Catheterization: 09/07/2015: The left ventricular systolic function is normal. Mild nonobstructive CAD. Left coronary system does vasodilate with intrcoronary nitroglycerin.   Continue aggressive preventive therapy.  She could have coronary vasospasm.  Consider adjusting medical therapy accordingly.  Will give a prescription of SL NTG.  LABORATORY DATA: CBC Latest Ref Rng & Units 09/27/2018 09/25/2018 02/04/2018  WBC 3.4 - 10.8 x10E3/uL 6.1 8.2 7.1  Hemoglobin 11.1 - 15.9 g/dL 96.2 95.2 84.1  Hematocrit 34.0 - 46.6 % 46.9(H) 45.6 46.7(H)  Platelets 150 - 450 x10E3/uL 307 331 351    CMP Latest Ref Rng & Units 09/27/2018 09/25/2018 02/04/2018  Glucose 65 - 99 mg/dL 324(M) 96 85  BUN 6 - 24 mg/dL 6 9 9   Creatinine 0.57 - 1.00 mg/dL 0.10 2.72 5.36  Sodium 134 - 144 mmol/L 140 139 140  Potassium 3.5 - 5.2 mmol/L 4.8 4.4 4.8  Chloride 96 - 106 mmol/L 101 99 100  CO2 20 - 29 mmol/L 24 25 24   Calcium 8.7 - 10.2 mg/dL 9.6 9.7 64.4  Total Protein 6.0 - 8.5 g/dL - - 6.6  Total Bilirubin 0.0 - 1.2 mg/dL - - 0.3  Alkaline Phos 39 - 117 IU/L - - 71  AST 0 - 40 IU/L - - 13  ALT 0 - 32 IU/L - - 15    Lipid Panel  Lab Results  Component Value Date   CHOL 181 09/25/2018   HDL 68 09/25/2018   LDLCALC 97 09/25/2018   TRIG 78 09/25/2018   CHOLHDL 2.7 09/25/2018    No components found for: NTPROBNP No results for input(s): PROBNP in the last 8760 hours. No results for input(s): TSH in the last 8760 hours.  BMP No results for input(s): NA, K, CL, CO2, GLUCOSE, BUN, CREATININE, CALCIUM, GFRNONAA, GFRAA in the last 8760 hours.  HEMOGLOBIN A1C No results found for:  HGBA1C, MPG  External Labs: Collected: 02/04/2021 provided by PCP Creatinine 0.78 mg/dL. eGFR: 82 mL/min per 1.73 m Sodium 140, potassium 5.7 Lipid profile: Total cholesterol 164, triglycerides 50, HDL 70, LDL 82, non-HDL 94 Hemoglobin A1c: 5.9 TSH: 2.48 Hemoglobin 16.2 g/dL, hematocrit 03%  IMPRESSION:    ICD-10-CM   1. Coronary atherosclerosis due to calcified coronary lesion  I25.10    I25.84     2. Precordial pain  R07.2 amLODipine (NORVASC) 5 MG tablet    3. Smoking  F17.200     4. Nonrheumatic aortic valve insufficiency  I35.1 PCV ECHOCARDIOGRAM COMPLETE    PCV ECHOCARDIOGRAM COMPLETE    CANCELED: PCV ECHOCARDIOGRAM COMPLETE  RECOMMENDATIONS: VEYA PINEO is a 60 y.o. female whose past medical history and cardiac risk factors include: Aortic atherosclerosis, calcified coronary atherosclerosis involving the RCA (not gated CT 03/07/2021), history of coronary vasospasm (cath 2016), COPD, family history of heart disease, smoking 0.5ppd and postmenopausal female.  Coronary atherosclerosis due to calcified coronary lesion: Continue aspirin and statin therapy. Patient has had an angiogram in the past which noted coronary vasospasm.  As result I started her on amlodipine and Toprol-XL at the last office visit.  Both of the medications have improved her symptoms significantly.  Patient would like refills. Reviewed the results of the echocardiogram and exercise nuclear stress test with the patient at today's visit and noted above for further reference. Since her symptoms have improved no additional cardiovascular testing is needed at this time. Educated on the importance of secondary prevention. Educated on the importance of complete smoking cessation.  History of coronary vasospasms: Refill Norvasc. Continue Toprol-XL. Sublingual nitroglycerin tablets to use on as needed basis.  Nonrheumatic aortic regurgitation: Severity noted to be moderate as per the most  recent echocardiogram. 1 year follow-up study recommended to evaluate disease progression. Overall asymptomatic.  Cigarette smoking: Tobacco cessation counseling: Currently smoking 7 cigarettes/day. Patient was informed of the dangers of tobacco abuse including stroke, cancer, and MI, as well as benefits of tobacco cessation. Patient is willing to quit at this time. Approximately 5 mins were spent counseling patient cessation techniques. We discussed various methods to help quit smoking, including deciding on a date to quit, joining a support group, pharmacological agents- nicotine gum/patch/lozenges.  I will reassess her progress at the next follow-up visit  FINAL MEDICATION LIST END OF ENCOUNTER: Meds ordered this encounter  Medications   amLODipine (NORVASC) 5 MG tablet    Sig: Take 1 tablet (5 mg total) by mouth every evening.    Dispense:  90 tablet    Refill:  1    **Patient requests 90 days supply**    Medications Discontinued During This Encounter  Medication Reason   amLODipine (NORVASC) 5 MG tablet Reorder     Current Outpatient Medications:    alendronate (FOSAMAX) 70 MG tablet, TAKE 1 TABLET(70 MG) BY MOUTH EVERY 7 DAYS WITH A FULL GLASS OF WATER AND ON AN EMPTY STOMACH, Disp: 4 tablet, Rfl: 5   aspirin EC 81 MG tablet, Take 1 tablet (81 mg total) by mouth daily., Disp: 30 tablet, Rfl: 3   atorvastatin (LIPITOR) 20 MG tablet, TAKE 1 TABLET BY MOUTH EVERY DAY, Disp: 90 tablet, Rfl: 3   calcium citrate (CALCITRATE - DOSED IN MG ELEMENTAL CALCIUM) 950 (200 Ca) MG tablet, Take 600 mg by mouth daily. Two tablets each morning, Disp: , Rfl:    cyclobenzaprine (FLEXERIL) 10 MG tablet, Take 1 tablet by mouth in the morning and at bedtime., Disp: , Rfl:    DULoxetine (CYMBALTA) 30 MG capsule, TAKE 2 CAPSULES(60 MG) BY MOUTH DAILY, Disp: 60 capsule, Rfl: 4   LINZESS 290 MCG CAPS capsule, Take 1 capsule by mouth daily., Disp: , Rfl:    metoprolol succinate (TOPROL-XL) 25 MG 24 hr  tablet, TAKE 1/2 TABLET(12.5 MG) BY MOUTH EVERY MORNING, Disp: 45 tablet, Rfl: 0   nitroGLYCERIN (NITROSTAT) 0.4 MG SL tablet, PLACE 1 TABLET UNDER TONGUE EVERY 5 MINUTES AS NEEDED FOR CHEST PAIN.IF MORE THAN 2 TABLETS ARE REQUIRED CALL EMS AND GO TO ER., Disp: 25 tablet, Rfl: 2   oxyCODONE-acetaminophen (PERCOCET/ROXICET) 5-325 MG tablet, Take 1 tablet by mouth as needed., Disp: ,  Rfl:    tiotropium (SPIRIVA) 18 MCG inhalation capsule, Place 1 capsule (18 mcg total) into inhaler and inhale daily., Disp: 30 capsule, Rfl: 12   amLODipine (NORVASC) 5 MG tablet, Take 1 tablet (5 mg total) by mouth every evening., Disp: 90 tablet, Rfl: 1  Orders Placed This Encounter  Procedures   PCV ECHOCARDIOGRAM COMPLETE    There are no Patient Instructions on file for this visit.   --Continue cardiac medications as reconciled in final medication list. --Return in about 1 year (around 06/06/2022) for Coronary artery calcification, review echo results, after her yearly physical with PCP, bring labs. Or sooner if needed. --Continue follow-up with your primary care physician regarding the management of your other chronic comorbid conditions.  Patient's questions and concerns were addressed to her satisfaction. She voices understanding of the instructions provided during this encounter.   This note was created using a voice recognition software as a result there may be grammatical errors inadvertently enclosed that do not reflect the nature of this encounter. Every attempt is made to correct such errors.  Tessa Lerner, [place], [facility]  Pager: [phone] Office: [phone]

## 2021-06-23 DIAGNOSIS — M5459 Other low back pain: Secondary | ICD-10-CM | POA: Diagnosis not present

## 2021-06-23 DIAGNOSIS — G9050 Complex regional pain syndrome I, unspecified: Secondary | ICD-10-CM | POA: Diagnosis not present

## 2021-06-23 DIAGNOSIS — G894 Chronic pain syndrome: Secondary | ICD-10-CM | POA: Diagnosis not present

## 2021-06-23 DIAGNOSIS — Z79891 Long term (current) use of opiate analgesic: Secondary | ICD-10-CM | POA: Diagnosis not present

## 2021-06-23 DIAGNOSIS — M47817 Spondylosis without myelopathy or radiculopathy, lumbosacral region: Secondary | ICD-10-CM | POA: Diagnosis not present

## 2021-07-12 ENCOUNTER — Other Ambulatory Visit: Payer: Self-pay

## 2021-07-12 DIAGNOSIS — R072 Precordial pain: Secondary | ICD-10-CM

## 2021-07-12 MED ORDER — METOPROLOL SUCCINATE ER 25 MG PO TB24: ORAL_TABLET | ORAL | 1 refills | Status: DC

## 2021-07-28 DIAGNOSIS — M5459 Other low back pain: Secondary | ICD-10-CM | POA: Diagnosis not present

## 2021-07-28 DIAGNOSIS — M47817 Spondylosis without myelopathy or radiculopathy, lumbosacral region: Secondary | ICD-10-CM | POA: Diagnosis not present

## 2021-07-28 DIAGNOSIS — G9050 Complex regional pain syndrome I, unspecified: Secondary | ICD-10-CM | POA: Diagnosis not present

## 2021-07-28 DIAGNOSIS — G894 Chronic pain syndrome: Secondary | ICD-10-CM | POA: Diagnosis not present

## 2021-08-25 DIAGNOSIS — G9050 Complex regional pain syndrome I, unspecified: Secondary | ICD-10-CM | POA: Diagnosis not present

## 2021-08-25 DIAGNOSIS — M5459 Other low back pain: Secondary | ICD-10-CM | POA: Diagnosis not present

## 2021-08-25 DIAGNOSIS — M47817 Spondylosis without myelopathy or radiculopathy, lumbosacral region: Secondary | ICD-10-CM | POA: Diagnosis not present

## 2021-08-25 DIAGNOSIS — G894 Chronic pain syndrome: Secondary | ICD-10-CM | POA: Diagnosis not present

## 2021-09-26 ENCOUNTER — Other Ambulatory Visit: Payer: Self-pay | Admitting: Family Medicine

## 2021-09-26 DIAGNOSIS — G9050 Complex regional pain syndrome I, unspecified: Secondary | ICD-10-CM | POA: Diagnosis not present

## 2021-09-26 DIAGNOSIS — M47817 Spondylosis without myelopathy or radiculopathy, lumbosacral region: Secondary | ICD-10-CM | POA: Diagnosis not present

## 2021-09-26 DIAGNOSIS — G894 Chronic pain syndrome: Secondary | ICD-10-CM | POA: Diagnosis not present

## 2021-10-10 ENCOUNTER — Other Ambulatory Visit: Payer: Self-pay | Admitting: Cardiology

## 2021-10-10 DIAGNOSIS — R072 Precordial pain: Secondary | ICD-10-CM

## 2021-10-25 DIAGNOSIS — Z79891 Long term (current) use of opiate analgesic: Secondary | ICD-10-CM | POA: Diagnosis not present

## 2021-10-25 DIAGNOSIS — G894 Chronic pain syndrome: Secondary | ICD-10-CM | POA: Diagnosis not present

## 2021-10-25 DIAGNOSIS — M47817 Spondylosis without myelopathy or radiculopathy, lumbosacral region: Secondary | ICD-10-CM | POA: Diagnosis not present

## 2021-10-25 DIAGNOSIS — M5459 Other low back pain: Secondary | ICD-10-CM | POA: Diagnosis not present

## 2021-10-25 DIAGNOSIS — G9050 Complex regional pain syndrome I, unspecified: Secondary | ICD-10-CM | POA: Diagnosis not present

## 2021-11-21 DIAGNOSIS — J449 Chronic obstructive pulmonary disease, unspecified: Secondary | ICD-10-CM | POA: Diagnosis not present

## 2021-11-21 DIAGNOSIS — J09X2 Influenza due to identified novel influenza A virus with other respiratory manifestations: Secondary | ICD-10-CM | POA: Diagnosis not present

## 2021-12-08 DIAGNOSIS — G9050 Complex regional pain syndrome I, unspecified: Secondary | ICD-10-CM | POA: Diagnosis not present

## 2021-12-08 DIAGNOSIS — M47817 Spondylosis without myelopathy or radiculopathy, lumbosacral region: Secondary | ICD-10-CM | POA: Diagnosis not present

## 2021-12-08 DIAGNOSIS — Z79891 Long term (current) use of opiate analgesic: Secondary | ICD-10-CM | POA: Diagnosis not present

## 2022-01-15 ENCOUNTER — Other Ambulatory Visit: Payer: Self-pay | Admitting: Cardiology

## 2022-01-15 DIAGNOSIS — R072 Precordial pain: Secondary | ICD-10-CM

## 2022-04-16 ENCOUNTER — Other Ambulatory Visit: Payer: Self-pay | Admitting: Cardiology

## 2022-04-16 DIAGNOSIS — R072 Precordial pain: Secondary | ICD-10-CM

## 2022-05-11 ENCOUNTER — Other Ambulatory Visit: Payer: Self-pay | Admitting: *Deleted

## 2022-05-11 DIAGNOSIS — Z1231 Encounter for screening mammogram for malignant neoplasm of breast: Secondary | ICD-10-CM

## 2022-05-16 ENCOUNTER — Ambulatory Visit
Admission: RE | Admit: 2022-05-16 | Discharge: 2022-05-16 | Disposition: A | Discharge status: Home / Self Care | Payer: Medicare HMO | Source: Ambulatory Visit | Attending: *Deleted | Admitting: *Deleted

## 2022-05-16 DIAGNOSIS — Z1231 Encounter for screening mammogram for malignant neoplasm of breast: Secondary | ICD-10-CM

## 2022-05-17 ENCOUNTER — Ambulatory Visit: Payer: Medicare HMO

## 2022-05-17 ENCOUNTER — Other Ambulatory Visit: Payer: Self-pay | Admitting: *Deleted

## 2022-05-17 DIAGNOSIS — M810 Age-related osteoporosis without current pathological fracture: Secondary | ICD-10-CM

## 2022-05-23 ENCOUNTER — Other Ambulatory Visit: Payer: Self-pay | Admitting: *Deleted

## 2022-05-23 DIAGNOSIS — Z122 Encounter for screening for malignant neoplasm of respiratory organs: Secondary | ICD-10-CM

## 2022-06-06 ENCOUNTER — Ambulatory Visit: Payer: Medicare HMO | Admitting: Cardiology

## 2022-06-12 ENCOUNTER — Ambulatory Visit: Payer: Medicare HMO | Admitting: Cardiology

## 2022-06-12 ENCOUNTER — Encounter: Payer: Self-pay | Admitting: Cardiology

## 2022-06-12 VITALS — BP 102/64 | HR 85 | Temp 98.1°F | Resp 16 | Ht 60.0 in | Wt 109.0 lb

## 2022-06-12 DIAGNOSIS — F17200 Nicotine dependence, unspecified, uncomplicated: Secondary | ICD-10-CM

## 2022-06-12 DIAGNOSIS — R072 Precordial pain: Secondary | ICD-10-CM

## 2022-06-12 DIAGNOSIS — I351 Nonrheumatic aortic (valve) insufficiency: Secondary | ICD-10-CM

## 2022-06-12 DIAGNOSIS — IMO0001 Reserved for inherently not codable concepts without codable children: Secondary | ICD-10-CM

## 2022-06-12 DIAGNOSIS — I2510 Atherosclerotic heart disease of native coronary artery without angina pectoris: Secondary | ICD-10-CM

## 2022-06-12 DIAGNOSIS — J449 Chronic obstructive pulmonary disease, unspecified: Secondary | ICD-10-CM

## 2022-06-12 MED ORDER — AMLODIPINE BESYLATE 5 MG PO TABS: 5.0000 mg | ORAL_TABLET | Freq: Every day | ORAL | 1 refills | Status: DC

## 2022-06-12 NOTE — Progress Notes (Signed)
ID:  Candace Middleton, DOB Oct 05, 1961, MRN [id number]  PCP:  Marva Panda, NP  Cardiologist:  Tessa Lerner, DO, [facility] (established care 04/14/2021) Former Cardiology Providers: Dr. Valera Castle   Date: 06/12/22 Last Office Visit: 06/06/2021  Chief Complaint  Patient presents with   Coronary artery calcification   Follow-up    HPI  Candace Middleton is a 61 y.o. female whose past medical history and cardiovascular risk factors include: Aortic atherosclerosis, calcified coronary atherosclerosis involving the RCA (not gated CT 03/07/2021), history of coronary vasospasm (cath 2016), COPD, family history of heart disease, smoking 0.5ppd and postmenopausal female.  She is referred to the office at the request of Marva Panda, NP for evaluation of  Atherosclerotic heart disease of the native coronary arteries without angina pectoris.  Given her significant history of smoking patient underwent lung cancer screening and was noted to have coronary artery calcification predominantly in the RCA distribution.  She also had chest discomfort which is different compared to her prior episode in 2016 when she underwent left heart catheterization and therefore since establishing care she has undergone echo and stress test.  Stress test was reported to be low risk and now she presents for 1 year follow-up visit.  Over the last 1 year patient states that she has had at least 2 episodes of precordial pain, located substernally, and has woken her up from sleep, and radiates to the neck.  The symptoms are usually resolved after taking 2 sublingual nitroglycerin tablets, intensity is 8 out of 10, lasting for a total of 10 to 12 minutes.  At times she has a similar discomfort with effort related activities especially when she overexerts.  She thinks that exertional discomfort is likely due to her underlying COPD as she also gets short of breath.  Unfortunately she continues to smoke half a pack per  day.  Reviewed the results of the echocardiogram and are noted below for further reference.  Outside labs from April 2023 independently reviewed and noted below for further reference as well.  FUNCTIONAL STATUS: No structured exercise program or daily routine.   ALLERGIES: Allergies  Allergen Reactions   Gabapentin Hives    Patient reports facial swelling, hives, facial involvement    Sulfa Antibiotics Anaphylaxis    Onset at age 54, "almost died because I stopped breathing."    MEDICATION LIST PRIOR TO VISIT: Current Meds  Medication Sig   alendronate (FOSAMAX) 70 MG tablet TAKE 1 TABLET(70 MG) BY MOUTH EVERY 7 DAYS WITH A FULL GLASS OF WATER AND ON AN EMPTY STOMACH   aspirin EC 81 MG tablet Take 1 tablet (81 mg total) by mouth daily.   atorvastatin (LIPITOR) 20 MG tablet TAKE 1 TABLET BY MOUTH EVERY DAY   calcium citrate (CALCITRATE - DOSED IN MG ELEMENTAL CALCIUM) 950 (200 Ca) MG tablet Take 600 mg by mouth daily. Two tablets each morning   cyclobenzaprine (FLEXERIL) 10 MG tablet Take 1 tablet by mouth in the morning and at bedtime.   DULoxetine (CYMBALTA) 30 MG capsule TAKE 2 CAPSULES(60 MG) BY MOUTH DAILY   lubiprostone (AMITIZA) 24 MCG capsule Take 24 mcg by mouth 2 (two) times daily with a meal.   metoprolol succinate (TOPROL-XL) 25 MG 24 hr tablet TAKE 1/2 TABLET(12.5 MG) BY MOUTH EVERY MORNING   nitroGLYCERIN (NITROSTAT) 0.4 MG SL tablet PLACE 1 TABLET UNDER TONGUE EVERY 5 MINUTES AS NEEDED FOR CHEST PAIN.IF MORE THAN 2 TABLETS ARE REQUIRED CALL EMS AND GO TO ER.  oxyCODONE-acetaminophen (PERCOCET/ROXICET) 5-325 MG tablet Take 1 tablet by mouth as needed.   TRELEGY ELLIPTA 100-62.5-25 MCG/ACT AEPB Take 1 puff by mouth daily.   [DISCONTINUED] amLODipine (NORVASC) 5 MG tablet TAKE 1 TABLET(5 MG) BY MOUTH EVERY EVENING     PAST MEDICAL HISTORY: Past Medical History:  Diagnosis Date   Allergy    Anxiety    Arthritis    Cancer (HCC)    bladder ca   COPD (chronic  obstructive pulmonary disease) (HCC)    Depression    GERD (gastroesophageal reflux disease)    Hyperlipidemia    Neuromuscular disorder (HCC) 2014   RSD - nerve blocks in back for treatment    Osteoporosis 09/05/2019    PAST SURGICAL HISTORY: Past Surgical History:  Procedure Laterality Date   bladder cancer  2006   BLADDER TUMOR EXCISION  12/25/2004   BREAST SURGERY  "long time ago"   lumpectomy    CARDIAC CATHETERIZATION N/A 09/07/2015   Procedure: Left Heart Cath and Coronary Angiography;  Surgeon: Corky Crafts, MD;  Location: [facility] INVASIVE CV LAB;  Service: Cardiovascular;  Laterality: N/A;   CESAREAN SECTION  1982   ENDOMETRIAL ABLATION  12/25/1993    FAMILY HISTORY: The patient family history includes Diabetes in her brother; Early death in her father; Epilepsy in her son; Heart disease in her mother; Melanoma in her sister; Pulmonary embolism in her father; Thyroid disease in her brother and sister.  SOCIAL HISTORY:  The patient  reports that she has been smoking cigarettes. She has a 15.00 pack-year smoking history. She has never used smokeless tobacco. She reports that she does not drink alcohol and does not use drugs.  REVIEW OF SYSTEMS: Review of Systems  Cardiovascular:  Positive for chest pain (see HPI) and dyspnea on exertion (chronic and stable - due to COPD). Negative for leg swelling, orthopnea, palpitations, paroxysmal nocturnal dyspnea and syncope.  Respiratory:  Positive for shortness of breath (chronic and stable.).   All other systems reviewed and are negative.   PHYSICAL EXAM:    06/12/2022    2:05 PM 06/06/2021    1:50 PM 04/14/2021   10:33 AM  Vitals with BMI  Height 5\' 0"  5\' 0"  5\' 0"   Weight 109 lbs 104 lbs 103 lbs 6 oz  BMI 21.29 20.31 20.19  Systolic 102 106 578  Diastolic 64 67 75  Pulse 85 86 87   CONSTITUTIONAL: Well-developed and well-nourished. No acute distress.  SKIN: Skin is warm and dry. No rash noted. No cyanosis. No pallor. No  jaundice HEAD: Normocephalic and atraumatic.  EYES: No scleral icterus MOUTH/THROAT: Moist oral membranes.  NECK: No JVD present. No thyromegaly noted. No carotid bruits  CHEST Normal respiratory effort. No intercostal retractions  LUNGS: Decreased breath sounds bilaterally. No stridor. No wheezes. No rales.  CARDIOVASCULAR: Regular, positive S1-S2, no murmurs rubs or gallops appreciated ABDOMINAL: Nonobese, soft, nontender, nondistended, positive bowel sounds in all 4 quadrant.  No apparent ascites.  EXTREMITIES: No peripheral edema  HEMATOLOGIC: No significant bruising NEUROLOGIC: Oriented to person, place, and time. Nonfocal. Normal muscle tone.  PSYCHIATRIC: Normal mood and affect. Normal behavior. Cooperative  CARDIAC DATABASE: EKG: 06/12/2022: Normal sinus rhythm, 81 bpm, normal axis, nonspecific ST-T changes, without underlying ischemia injury pattern.  Echocardiogram: 05/17/2022:  Normal LV systolic function with visual EF 55-60%. Left ventricle cavity is normal in size. Normal global wall motion. Normal diastolic filling pattern, normal LAP.  Left atrial cavity is normal in size. An atrial septal aneurysm  without a patent foramen ovale is present. Moderate (Grade III) aortic regurgitation. Compared to 04/21/2021 no significant change.   Stress Testing: Exercise Myoview stress test 04/27/2021: 1 Day Rest/Stress Protocol. Exercise time 8 minutes 44seconds on Bruce protocol, achieved 10.16 METS, and 86% of APMHR. Stress ECG negative for ischemia.  Normal myocardial perfusion without evidence of reversible ischemia or prior infarct. Stress LVEF 64%. All segments of the LV demonstrate normal wall motion andthickening. No prior studies available for comparison.  Heart Catheterization: 09/07/2015: The left ventricular systolic function is normal. Mild nonobstructive CAD. Left coronary system does vasodilate with intrcoronary nitroglycerin.   Continue aggressive preventive therapy.   She could have coronary vasospasm.  Consider adjusting medical therapy accordingly.  Will give a prescription of SL NTG.  LABORATORY DATA: External Labs: Collected: 02/04/2021 provided by PCP Creatinine 0.78 mg/dL. eGFR: 82 mL/min per 1.73 m Sodium 140, potassium 5.7 Lipid profile: Total cholesterol 164, triglycerides 50, HDL 70, LDL 82, non-HDL 94 Hemoglobin A1c: 5.9 TSH: 2.48 Hemoglobin 16.2 g/dL, hematocrit 16%  External Labs: Collected: April 19, 2022: Total cholesterol 164, triglycerides 58, HDL 62, LDL 88, non-HDL 102. BUN 14, creatinine 0.70 Sodium 137, potassium 4.9, chloride 104, bicarb 23, AST 17, ALT 15. Hemoglobin 15.3 g/dL, hematocrit 10.9%. TSH 1.83. A1c 6%   IMPRESSION:    ICD-10-CM   1. Coronary atherosclerosis due to calcified coronary lesion  I25.10 EKG 12-Lead   I25.84     2. Precordial pain  R07.2 amLODipine (NORVASC) 5 MG tablet    3. Nonrheumatic aortic valve insufficiency  I35.1     4. Chronic obstructive pulmonary disease, unspecified COPD type (HCC)  J44.9     5. Smoking  F17.200        RECOMMENDATIONS: Candace Middleton is a 61 y.o. female whose past medical history and cardiac risk factors include: Aortic atherosclerosis, calcified coronary atherosclerosis involving the RCA (not gated CT 03/07/2021), history of coronary vasospasm (cath 2016), COPD, family history of heart disease, smoking 0.5ppd and postmenopausal female.  Patient continues to have precordial discomfort which may possibly be cardiac in etiology.  Recent work-up includes an echo and nuclear stress test.  The nuclear stress test was reported to be overall low risk as myocardial perfusion was normal.  She has had 2 episodes of precordial pain which required sublingual nitroglycerin tablets over the last 1 year.  EKG today shows sinus rhythm with subtle ST-T changes.  Given her symptoms, EKG findings, and risk factors I recommended undergoing coronary CTA for further  evaluation of obstructive CAD.  However, patient states that is likely secondary to her COPD/anxiety and would like to work on smoking cessation and if the symptoms continue we will proceed with coronary CTA.  Recommended 1 month follow-up to reevaluate her symptoms; however, patient prefers to 3 months as a we will give her some time with achieving complete smoking cessation.  Educated on seeking medical attention sooner by going to the closest ER via EMS if the symptoms increase in intensity, frequency, duration, or has typical chest pain as discussed in the office.  Patient verbalized understanding.  As a part of today's office visit reviewed echocardiogram from May 2023 results noted above for further reference.  The severity of aortic regurgitation remains relatively stable.  Labs from Care Everywhere from April 2023 independently reviewed and noted above for further reference as well.  FINAL MEDICATION LIST END OF ENCOUNTER: Meds ordered this encounter  Medications   amLODipine (NORVASC) 5 MG tablet  Sig: Take 1 tablet (5 mg total) by mouth daily.    Dispense:  90 tablet    Refill:  1    Medications Discontinued During This Encounter  Medication Reason   LINZESS 290 MCG CAPS capsule    tiotropium (SPIRIVA) 18 MCG inhalation capsule    amLODipine (NORVASC) 5 MG tablet Reorder     Current Outpatient Medications:    alendronate (FOSAMAX) 70 MG tablet, TAKE 1 TABLET(70 MG) BY MOUTH EVERY 7 DAYS WITH A FULL GLASS OF WATER AND ON AN EMPTY STOMACH, Disp: 4 tablet, Rfl: 5   aspirin EC 81 MG tablet, Take 1 tablet (81 mg total) by mouth daily., Disp: 30 tablet, Rfl: 3   atorvastatin (LIPITOR) 20 MG tablet, TAKE 1 TABLET BY MOUTH EVERY DAY, Disp: 90 tablet, Rfl: 3   calcium citrate (CALCITRATE - DOSED IN MG ELEMENTAL CALCIUM) 950 (200 Ca) MG tablet, Take 600 mg by mouth daily. Two tablets each morning, Disp: , Rfl:    cyclobenzaprine (FLEXERIL) 10 MG tablet, Take 1 tablet by mouth in the  morning and at bedtime., Disp: , Rfl:    DULoxetine (CYMBALTA) 30 MG capsule, TAKE 2 CAPSULES(60 MG) BY MOUTH DAILY, Disp: 60 capsule, Rfl: 4   lubiprostone (AMITIZA) 24 MCG capsule, Take 24 mcg by mouth 2 (two) times daily with a meal., Disp: , Rfl:    metoprolol succinate (TOPROL-XL) 25 MG 24 hr tablet, TAKE 1/2 TABLET(12.5 MG) BY MOUTH EVERY MORNING, Disp: 45 tablet, Rfl: 1   nitroGLYCERIN (NITROSTAT) 0.4 MG SL tablet, PLACE 1 TABLET UNDER TONGUE EVERY 5 MINUTES AS NEEDED FOR CHEST PAIN.IF MORE THAN 2 TABLETS ARE REQUIRED CALL EMS AND GO TO ER., Disp: 25 tablet, Rfl: 2   oxyCODONE-acetaminophen (PERCOCET/ROXICET) 5-325 MG tablet, Take 1 tablet by mouth as needed., Disp: , Rfl:    TRELEGY ELLIPTA 100-62.5-25 MCG/ACT AEPB, Take 1 puff by mouth daily., Disp: , Rfl:    amLODipine (NORVASC) 5 MG tablet, Take 1 tablet (5 mg total) by mouth daily., Disp: 90 tablet, Rfl: 1  Orders Placed This Encounter  Procedures   EKG 12-Lead    There are no Patient Instructions on file for this visit.   --Continue cardiac medications as reconciled in final medication list. --Return in about 3 months (around 09/12/2022) for Reevaluation of, Chest pain. Or sooner if needed. --Continue follow-up with your primary care physician regarding the management of your other chronic comorbid conditions.  Patient's questions and concerns were addressed to her satisfaction. She voices understanding of the instructions provided during this encounter.   This note was created using a voice recognition software as a result there may be grammatical errors inadvertently enclosed that do not reflect the nature of this encounter. Every attempt is made to correct such errors.  Tessa Lerner, [place], [facility]  Pager: [phone] Office: [phone]

## 2022-06-16 ENCOUNTER — Ambulatory Visit
Admission: RE | Admit: 2022-06-16 | Discharge: 2022-06-16 | Disposition: A | Discharge status: Home / Self Care | Payer: Medicare HMO | Source: Ambulatory Visit | Attending: *Deleted | Admitting: *Deleted

## 2022-06-16 DIAGNOSIS — M810 Age-related osteoporosis without current pathological fracture: Secondary | ICD-10-CM

## 2022-06-26 ENCOUNTER — Ambulatory Visit
Admission: RE | Admit: 2022-06-26 | Discharge: 2022-06-26 | Disposition: A | Discharge status: Home / Self Care | Payer: Medicare HMO | Source: Ambulatory Visit | Attending: *Deleted | Admitting: *Deleted

## 2022-06-26 DIAGNOSIS — Z122 Encounter for screening for malignant neoplasm of respiratory organs: Secondary | ICD-10-CM

## 2022-07-16 ENCOUNTER — Other Ambulatory Visit: Payer: Self-pay | Admitting: Cardiology

## 2022-07-16 DIAGNOSIS — R072 Precordial pain: Secondary | ICD-10-CM

## 2022-09-12 ENCOUNTER — Ambulatory Visit: Payer: Medicare HMO | Admitting: Cardiology

## 2022-10-16 ENCOUNTER — Ambulatory Visit: Payer: Medicare HMO | Admitting: Cardiology

## 2023-01-17 ENCOUNTER — Other Ambulatory Visit: Payer: Self-pay | Admitting: Cardiology

## 2023-01-17 DIAGNOSIS — R072 Precordial pain: Secondary | ICD-10-CM

## 2023-06-05 ENCOUNTER — Other Ambulatory Visit: Payer: Self-pay | Admitting: Cardiology

## 2023-06-05 DIAGNOSIS — R072 Precordial pain: Secondary | ICD-10-CM

## 2023-07-05 ENCOUNTER — Encounter: Payer: Self-pay | Admitting: Internal Medicine

## 2023-07-05 ENCOUNTER — Other Ambulatory Visit: Payer: Self-pay | Admitting: Cardiology

## 2023-07-05 DIAGNOSIS — R072 Precordial pain: Secondary | ICD-10-CM

## 2023-07-06 ENCOUNTER — Other Ambulatory Visit: Payer: Self-pay | Admitting: Cardiology

## 2023-07-06 DIAGNOSIS — R072 Precordial pain: Secondary | ICD-10-CM

## 2023-09-17 ENCOUNTER — Other Ambulatory Visit: Payer: Self-pay

## 2023-09-17 ENCOUNTER — Emergency Department (HOSPITAL_COMMUNITY): Payer: Medicare HMO

## 2023-09-17 ENCOUNTER — Encounter (HOSPITAL_COMMUNITY): Payer: Self-pay | Admitting: *Deleted

## 2023-09-17 ENCOUNTER — Emergency Department (HOSPITAL_COMMUNITY)
Admission: EM | Admit: 2023-09-17 | Discharge: 2023-09-17 | Disposition: A | Discharge status: Home / Self Care | Payer: Medicare HMO | Attending: Emergency Medicine | Admitting: Emergency Medicine

## 2023-09-17 DIAGNOSIS — Z7982 Long term (current) use of aspirin: Secondary | ICD-10-CM | POA: Diagnosis not present

## 2023-09-17 DIAGNOSIS — N23 Unspecified renal colic: Secondary | ICD-10-CM | POA: Diagnosis not present

## 2023-09-17 DIAGNOSIS — R109 Unspecified abdominal pain: Secondary | ICD-10-CM | POA: Diagnosis present

## 2023-09-17 LAB — URINALYSIS, W/ REFLEX TO CULTURE (INFECTION SUSPECTED)
Bilirubin Urine: NEGATIVE
Glucose, UA: NEGATIVE mg/dL
Ketones, ur: 5 mg/dL — AB
Leukocytes,Ua: NEGATIVE
Nitrite: NEGATIVE
Protein, ur: NEGATIVE mg/dL
RBC / HPF: >50 RBC/hpf (ref 0–5)
Specific Gravity, Urine: 1.028 (ref 1.005–1.030)
pH: 5 (ref 5.0–8.0)

## 2023-09-17 LAB — CBC WITH DIFFERENTIAL/PLATELET
Abs Immature Granulocytes: 0.04 10*3/uL (ref 0.00–0.07)
Basophils Absolute: 0 10*3/uL (ref 0.0–0.1)
Basophils Relative: 0 %
Eosinophils Absolute: 0.1 10*3/uL (ref 0.0–0.5)
Eosinophils Relative: 1 %
HCT: 46.3 % — ABNORMAL HIGH (ref 36.0–46.0)
Hemoglobin: 15.3 g/dL — ABNORMAL HIGH (ref 12.0–15.0)
Immature Granulocytes: 0 %
Lymphocytes Relative: 14 %
Lymphs Abs: 1.7 10*3/uL (ref 0.7–4.0)
MCH: 31.4 pg (ref 26.0–34.0)
MCHC: 33 g/dL (ref 30.0–36.0)
MCV: 95.1 fL (ref 80.0–100.0)
Monocytes Absolute: 0.9 10*3/uL (ref 0.1–1.0)
Monocytes Relative: 7 %
Neutro Abs: 9.6 10*3/uL — ABNORMAL HIGH (ref 1.7–7.7)
Neutrophils Relative %: 78 %
Platelets: 294 10*3/uL (ref 150–400)
RBC: 4.87 MIL/uL (ref 3.87–5.11)
RDW: 13.7 % (ref 11.5–15.5)
WBC: 12.3 10*3/uL — ABNORMAL HIGH (ref 4.0–10.5)
nRBC: 0 % (ref 0.0–0.2)

## 2023-09-17 LAB — BASIC METABOLIC PANEL
Anion gap: 9 (ref 5–15)
BUN: 15 mg/dL (ref 8–23)
CO2: 25 mmol/L (ref 22–32)
Calcium: 8.9 mg/dL (ref 8.9–10.3)
Chloride: 106 mmol/L (ref 98–111)
Creatinine, Ser: 0.85 mg/dL (ref 0.44–1.00)
GFR, Estimated: >60 mL/min (ref >60)
Glucose, Bld: 198 mg/dL — ABNORMAL HIGH (ref 70–99)
Potassium: 3.6 mmol/L (ref 3.5–5.1)
Sodium: 140 mmol/L (ref 135–145)

## 2023-09-17 MED ORDER — KETOROLAC TROMETHAMINE 30 MG/ML IJ SOLN
15.0000 mg | Freq: Once | INTRAMUSCULAR | Status: AC
  Administered 2023-09-17: 15 mg via INTRAVENOUS
  Filled 2023-09-17: qty 1

## 2023-09-17 NOTE — ED Triage Notes (Signed)
Pt reports she woke up around 2230 with left sided flank pain and left sided abdominal pain. Nausea and vomiting. Pt is currently being treated for UTI, taking Keflex as prescribed. Denies fevers.

## 2023-09-17 NOTE — ED Provider Notes (Signed)
[place] EMERGENCY DEPARTMENT AT [facility] Provider Note   CSN: [id number] Arrival date & time: 09/17/23  0143     History  Chief Complaint  Patient presents with   Flank Pain    LARIZA Middleton is a 62 y.o. female.  Patient presents to the emergency department for evaluation of left flank pain.  Patient reports that the pain is in the left back but radiates down into the left groin area.  She has had associated nausea and vomiting.  She reports that she was seen at [facility] urology and told she had a UTI, is taking Keflex.       Home Medications Prior to Admission medications   Medication Sig Start Date End Date Taking? Authorizing Provider  alendronate (FOSAMAX) 70 MG tablet TAKE 1 TABLET(70 MG) BY MOUTH EVERY 7 DAYS WITH A FULL GLASS OF WATER AND ON AN EMPTY STOMACH 10/27/20   Cresenzo, Cyndi Lennert, MD  amLODipine (NORVASC) 5 MG tablet TAKE 1 TABLET(5 MG) BY MOUTH DAILY 06/07/23   Tolia, Sunit, DO  aspirin EC 81 MG tablet Take 1 tablet (81 mg total) by mouth daily. 08/13/15   Vivianne Master, PA-C  atorvastatin (LIPITOR) 20 MG tablet TAKE 1 TABLET BY MOUTH EVERY DAY 06/22/20   Cresenzo, Cyndi Lennert, MD  calcium citrate (CALCITRATE - DOSED IN MG ELEMENTAL CALCIUM) 950 (200 Ca) MG tablet Take 600 mg by mouth daily. Two tablets each morning    [provider]  cyclobenzaprine (FLEXERIL) 10 MG tablet Take 1 tablet by mouth in the morning and at bedtime.    [provider]  DULoxetine (CYMBALTA) 30 MG capsule TAKE 2 CAPSULES(60 MG) BY MOUTH DAILY 03/21/21   Cresenzo, Cyndi Lennert, MD  lubiprostone (AMITIZA) 24 MCG capsule Take 24 mcg by mouth 2 (two) times daily with a meal.    [provider]  metoprolol succinate (TOPROL-XL) 25 MG 24 hr tablet TAKE 1/2 TABLET(12.5 MG) BY MOUTH EVERY MORNING 07/06/23   Tolia, Sunit, DO  nitroGLYCERIN (NITROSTAT) 0.4 MG SL tablet PLACE 1 TABLET UNDER TONGUE EVERY 5 MINUTES AS NEEDED FOR CHEST PAIN.IF MORE THAN 2 TABLETS ARE  REQUIRED CALL EMS AND GO TO ER. 05/09/21   Odis Hollingshead, Sunit, DO  oxyCODONE-acetaminophen (PERCOCET/ROXICET) 5-325 MG tablet Take 1 tablet by mouth as needed.    [provider]  Dwyane Luo 100-62.5-25 MCG/ACT AEPB Take 1 puff by mouth daily. 05/19/22   [provider]      Allergies    Gabapentin and Sulfa antibiotics    Review of Systems   Review of Systems  Physical Exam Updated Vital Signs BP (!) 152/71 (BP Location: Left Arm)   Pulse (!) 52   Temp 97.7 F (36.5 C) (Oral)   Resp 16   SpO2 100%  Physical Exam Vitals and nursing note reviewed.  Constitutional:      General: She is not in acute distress.    Appearance: She is well-developed.  HENT:     Head: Normocephalic and atraumatic.     Mouth/Throat:     Mouth: Mucous membranes are moist.  Eyes:     General: Vision grossly intact. Gaze aligned appropriately.     Extraocular Movements: Extraocular movements intact.     Conjunctiva/sclera: Conjunctivae normal.  Cardiovascular:     Rate and Rhythm: Normal rate and regular rhythm.     Pulses: Normal pulses.     Heart sounds: Normal heart sounds, S1 normal and S2 normal. No murmur heard.  No friction rub. No gallop.  Pulmonary:     Effort: Pulmonary effort is normal. No respiratory distress.     Breath sounds: Normal breath sounds.  Abdominal:     General: Bowel sounds are normal.     Palpations: Abdomen is soft.     Tenderness: There is no abdominal tenderness. There is no guarding or rebound.     Hernia: No hernia is present.  Musculoskeletal:        General: No swelling.     Cervical back: Full passive range of motion without pain, normal range of motion and neck supple. No spinous process tenderness or muscular tenderness. Normal range of motion.     Right lower leg: No edema.     Left lower leg: No edema.  Skin:    General: Skin is warm and dry.     Capillary Refill: Capillary refill takes less than 2 seconds.     Findings: No ecchymosis,  erythema, rash or wound.  Neurological:     General: No focal deficit present.     Mental Status: She is alert and oriented to person, place, and time.     GCS: GCS eye subscore is 4. GCS verbal subscore is 5. GCS motor subscore is 6.     Cranial Nerves: Cranial nerves 2-12 are intact.     Sensory: Sensation is intact.     Motor: Motor function is intact.     Coordination: Coordination is intact.  Psychiatric:        Attention and Perception: Attention normal.        Mood and Affect: Mood normal.        Speech: Speech normal.        Behavior: Behavior normal.     ED Results / Procedures / Treatments   Labs (all labs ordered are listed, but only abnormal results are displayed) Labs Reviewed  CBC WITH DIFFERENTIAL/PLATELET - Abnormal; Notable for the following components:      Result Value   WBC 12.3 (*)    Hemoglobin 15.3 (*)    HCT 46.3 (*)    Neutro Abs 9.6 (*)    All other components within normal limits  BASIC METABOLIC PANEL - Abnormal; Notable for the following components:   Glucose, Bld 198 (*)    All other components within normal limits  URINALYSIS, W/ REFLEX TO CULTURE (INFECTION SUSPECTED) - Abnormal; Notable for the following components:   Color, Urine AMBER (*)    APPearance HAZY (*)    Hgb urine dipstick LARGE (*)    Ketones, ur 5 (*)    Bacteria, UA FEW (*)    All other components within normal limits  URINE CULTURE    EKG None  Radiology CT RENAL STONE STUDY  Result Date: 09/17/2023 CLINICAL DATA:  Left flank pain. EXAM: CT ABDOMEN AND PELVIS WITHOUT CONTRAST TECHNIQUE: Multidetector CT imaging of the abdomen and pelvis was performed following the standard protocol without IV contrast. RADIATION DOSE REDUCTION: This exam was performed according to the departmental dose-optimization program which includes automated exposure control, adjustment of the mA and/or kV according to patient size and/or use of iterative reconstruction technique. COMPARISON:  July 10, 2023 FINDINGS: Lower chest: There is marked severity central lobular emphysematous lung disease. Hepatobiliary: No focal liver abnormality is seen. No gallstones, gallbladder wall thickening, or biliary dilatation. Pancreas: Unremarkable. No pancreatic ductal dilatation or surrounding inflammatory changes. Spleen: Normal in size without focal abnormality. Adrenals/Urinary Tract: Adrenal glands are unremarkable. Kidneys are  normal in size. A 2.1 cm diameter cyst is seen within the lower pole of the right kidney. A 2 mm obstructing renal calculus is seen within the distal left ureter, near the left UVJ (axial CT image 57, CT series 2), with mild left-sided hydronephrosis and hydroureter. The urinary bladder is poorly distended and subsequently limited in evaluation. Stomach/Bowel: Stomach is within normal limits. Appendix appears normal. No evidence of bowel wall thickening, distention, or inflammatory changes. Noninflamed diverticula are seen scattered throughout the large bowel. Vascular/Lymphatic: Aortic atherosclerosis. No enlarged abdominal or pelvic lymph nodes. Reproductive: Uterus and bilateral adnexa are unremarkable. Other: No abdominal wall hernia or abnormality. No abdominopelvic ascites. Musculoskeletal: No acute or significant osseous findings. IMPRESSION: 1. 2 mm obstructing renal calculus within the distal left ureter, near the left UVJ. 2. Colonic diverticulosis. 3. Marked severity central lobular emphysematous lung disease. 4. Aortic atherosclerosis. Aortic Atherosclerosis (ICD10-I70.0) and Emphysema (ICD10-J43.9). Electronically Signed   By: Aram Candela M.D.   On: 09/17/2023 03:20    Procedures Procedures    Medications Ordered in ED Medications  ketorolac (TORADOL) 30 MG/ML injection 15 mg (15 mg Intravenous Given 09/17/23 0217)    ED Course/ Medical Decision Making/ A&P                                 Medical Decision Making Amount and/or Complexity of Data Reviewed Labs:  ordered. Radiology: ordered.  Risk Prescription drug management.   Differential Diagnosis considered includes, but not limited to: Renal colic/kidney stone; pyelonephritis; aortic dissection; musculoskeletal pain.  Presents with left flank pain that began tonight associated with nausea and vomiting.  Pain radiates to the groin.  She is on Keflex for presumed urinary tract infection.  Urinalysis, however, shows mostly microscopic hematuria, no obvious signs of infection.  Patient underwent CT renal study that shows a 2 mm UVJ stone that does explain the patient's current symptoms.  No signs of concomitant infection.  Patient is afebrile.  Will treat with analgesia, follow-up with [facility]. Patient has oxycodone to take at home.        Final Clinical Impression(s) / ED Diagnoses Final diagnoses:  Ureteral colic    Rx / DC Orders ED Discharge Orders     None         Gilda Crease, MD 09/17/23 [phone]

## 2023-09-18 LAB — URINE CULTURE: Culture: NO GROWTH

## 2023-09-20 ENCOUNTER — Other Ambulatory Visit: Payer: Self-pay | Admitting: Urology

## 2023-09-21 NOTE — Progress Notes (Signed)
COVID Vaccine Completed:  Date of COVID positive in last 90 days:  PCP - Ruthy Dick, MD Cardiologist -  Tessa Lerner, DO  Chest x-ray -  EKG -  Stress Test - 04-27-21 Epic ECHO - 05-17-22 Epic Cardiac Cath - 09-07-15 Epic Pacemaker/ICD device last checked: Spinal Cord Stimulator:  Bowel Prep -   Sleep Study -  CPAP -   Fasting Blood Sugar -  Checks Blood Sugar _____ times a day  Last dose of GLP1 agonist-  N/A GLP1 instructions:  N/A   Last dose of SGLT-2 inhibitors-  N/A SGLT-2 instructions: N/A   Blood Thinner Instructions:  Time Aspirin Instructions: Last Dose:  Activity level:  Can go up a flight of stairs and perform activities of daily living without stopping and without symptoms of chest pain or shortness of breath.  Able to exercise without symptoms  Unable to go up a flight of stairs without symptoms of     Anesthesia review:  CAD, COPD  Patient denies shortness of breath, fever, cough and chest pain at PAT appointment  Patient verbalized understanding of instructions that were given to them at the PAT appointment. Patient was also instructed that they will need to review over the PAT instructions again at home before surgery.

## 2023-09-21 NOTE — Patient Instructions (Addendum)
SURGICAL WAITING ROOM VISITATION Patients having surgery or a procedure may have no more than 2 support people in the waiting area - these visitors may rotate.    Children under the age of 47 must have an adult with them who is not the patient.  If the patient needs to stay at the hospital during part of their recovery, the visitor guidelines for inpatient rooms apply. Pre-op nurse will coordinate an appropriate time for 1 support person to accompany patient in pre-op.  This support person may not rotate.    Please refer to the [facility] website for the visitor guidelines for Inpatients (after your surgery is over and you are in a regular room).       Your procedure is scheduled on: 09-26-23   Report to [facility] Main Entrance    Report to admitting at 1:15 PM   Call this number if you have problems the morning of surgery [phone]   Do not eat food or drink liquids :After Midnight.           If you have questions, please contact your surgeon's office.   FOLLOW  ANY ADDITIONAL PRE OP INSTRUCTIONS YOU RECEIVED FROM YOUR SURGEON'S OFFICE!!!     Oral Hygiene is also important to reduce your risk of infection.                                    Remember - BRUSH YOUR TEETH THE MORNING OF SURGERY WITH YOUR REGULAR TOOTHPASTE   Do NOT smoke after Midnight   Take these medicines the morning of surgery with A SIP OF WATER:   Metoprolol  Oxycodone if needed  Okay to use inhalers  Stop all vitamins and herbal supplements 7 days before surgery  DO NOT TAKE ANY ORAL DIABETIC MEDICATIONS DAY OF YOUR SURGERY  Bring CPAP mask and tubing day of surgery.                              You may not have any metal on your body including hair pins, jewelry, and body piercing             Do not wear make-up, lotions, powders, perfumes or deodorant  Do not wear nail polish including gel and S&S, artificial/acrylic nails, or any other type of covering on natural nails including  finger and toenails. If you have artificial nails, gel coating, etc. that needs to be removed by a nail salon please have this removed prior to surgery or surgery may need to be canceled/ delayed if the surgeon/ anesthesia feels like they are unable to be safely monitored.   Do not shave  48 hours prior to surgery.    Do not bring valuables to the hospital. [facility] IS NOT RESPONSIBLE   FOR VALUABLES.   Contacts, dentures or bridgework may not be worn into surgery.   DO NOT BRING YOUR HOME MEDICATIONS TO THE HOSPITAL. PHARMACY WILL DISPENSE MEDICATIONS LISTED ON YOUR MEDICATION LIST TO YOU DURING YOUR ADMISSION IN THE HOSPITAL!    Patients discharged on the day of surgery will not be allowed to drive home.  Someone NEEDS to stay with you for the first 24 hours after anesthesia.   Special Instructions: Bring a copy of your healthcare power of attorney and living will documents the day of surgery if you haven't scanned them  before.              Please read over the following fact sheets you were given: IF YOU HAVE QUESTIONS ABOUT YOUR PRE-OP INSTRUCTIONS PLEASE CALL [phone] Gwen  If you received a COVID test during your pre-op visit  it is requested that you wear a mask when out in public, stay away from anyone that may not be feeling well and notify your surgeon if you develop symptoms. If you test positive for Covid or have been in contact with anyone that has tested positive in the last 10 days please notify you surgeon.  [facility] - Preparing for Surgery Before surgery, you can play an important role.  Because skin is not sterile, your skin needs to be as free of germs as possible.  You can reduce the number of germs on your skin by washing with CHG (chlorahexidine gluconate) soap before surgery.  CHG is an antiseptic cleaner which kills germs and bonds with the skin to continue killing germs even after washing. Please DO NOT use if you have an allergy to CHG or antibacterial soaps.   If your skin becomes reddened/irritated stop using the CHG and inform your nurse when you arrive at Short Stay. Do not shave (including legs and underarms) for at least 48 hours prior to the first CHG shower.  You may shave your face/neck.  Please follow these instructions carefully:  1.  Shower with CHG Soap the night before surgery and the  morning of surgery.  2.  If you choose to wash your hair, wash your hair first as usual with your normal  shampoo.  3.  After you shampoo, rinse your hair and body thoroughly to remove the shampoo.                             4.  Use CHG as you would any other liquid soap.  You can apply chg directly to the skin and wash.  Gently with a scrungie or clean washcloth.  5.  Apply the CHG Soap to your body ONLY FROM THE NECK DOWN.   Do   not use on face/ open                           Wound or open sores. Avoid contact with eyes, ears mouth and   genitals (private parts).                       Wash face,  Genitals (private parts) with your normal soap.             6.  Wash thoroughly, paying special attention to the area where your    surgery  will be performed.  7.  Thoroughly rinse your body with warm water from the neck down.  8.  DO NOT shower/wash with your normal soap after using and rinsing off the CHG Soap.                9.  Pat yourself dry with a clean towel.            10.  Wear clean pajamas.            11.  Place clean sheets on your bed the night of your first shower and do not  sleep with pets. Day of Surgery : Do not apply any lotions/deodorants the morning  of surgery.  Please wear clean clothes to the hospital/surgery center.  FAILURE TO FOLLOW THESE INSTRUCTIONS MAY RESULT IN THE CANCELLATION OF YOUR SURGERY  PATIENT SIGNATURE_________________________________  NURSE SIGNATURE__________________________________  ________________________________________________________________________

## 2023-09-24 ENCOUNTER — Encounter (HOSPITAL_COMMUNITY)
Admission: RE | Admit: 2023-09-24 | Discharge: 2023-09-24 | Disposition: A | Discharge status: Home / Self Care | Payer: Medicare HMO | Source: Ambulatory Visit | Attending: Urology | Admitting: Urology

## 2023-09-24 ENCOUNTER — Encounter (HOSPITAL_COMMUNITY): Payer: Self-pay

## 2023-09-24 ENCOUNTER — Other Ambulatory Visit: Payer: Self-pay

## 2023-09-24 VITALS — Ht 59.0 in | Wt 77.0 lb

## 2023-09-24 DIAGNOSIS — I2510 Atherosclerotic heart disease of native coronary artery without angina pectoris: Secondary | ICD-10-CM

## 2023-09-24 HISTORY — DX: Atherosclerotic heart disease of native coronary artery without angina pectoris: I25.10

## 2023-09-24 HISTORY — DX: Personal history of urinary calculi: Z87.442

## 2023-09-24 NOTE — Progress Notes (Signed)
Case: [id number] Date/Time: 09/26/23 1515   Procedures:      CYSTOSCOPY LEFT RETROGRADE PYELOGRAM URETEROSCOPY/HOLMIUM LASER/STENT PLACEMENT (Left)     CYSTOSCOPY WITH BLADDER  BIOPSY AND FULGERATION - 45 MINS FOR CASE   Anesthesia type: General   Pre-op diagnosis: URETERAL STONE HISTORY OF BLADDER CANCER   Location: [facility] ROOM 03 / [facility]   Surgeons: Loletta Parish., MD       DISCUSSION: Candace Middleton is a 62 year old female who presents to PAT prior to surgery above.  Past medical history significant for everyday smoking, mild nonobstructive CAD (by cath in 2016), moderate AR, COPD, GERD, kidney stones, CRPS, history of bladder cancer.  Patient follows with cardiology for history of CAD and coronary vasospasms by cath in 2016, DOE, moderate AR. She was last seen in clinic on 06/12/2022. At that time she reported 2 episodes of chest pain over the past year which resolved after taking nitroglycerin.  She was advised that if symptoms continued a coronary CTA would be ordered.  She was advised to follow-up in 3 months but was lost to follow-up. She underwent stress testing on 04/27/2021 due to complaints of chest discomfort and DOE which was negative for ischemia. Had an echo on 05/17/22 which showed normal EF with moderate AR.    She follows with PCP for chronic medical issues. Last seen on 05/15/23 and treated for UTI. BP is controlled. Uses inhalers for COPD  At PAT visit she denies chest pain, shortness of breath, and has good functional status.  VS: Ht 4\' 11"  (1.499 m)   Wt 34.9 kg   BMI 15.55 kg/m   PROVIDERS: Dalton, Cherlyn Roberts, FNP   LABS: Labs reviewed: Acceptable for surgery. (all labs ordered are listed, but only abnormal results are displayed)  Labs Reviewed - No data to display   IMAGES:  CT Renal 09/17/2023:  IMPRESSION: 1. 2 mm obstructing renal calculus within the distal left ureter, near the left UVJ. 2. Colonic diverticulosis. 3. Marked severity  central lobular emphysematous lung disease. 4. Aortic atherosclerosis.   Aortic Atherosclerosis (ICD10-I70.0) and Emphysema (ICD10-J43.9).   EKG: Obtain DOS   CV:   Echocardiogram 05/17/2022:   Normal LV systolic function with visual EF 55-60%. Left ventricle cavity  is normal in size. Normal global wall motion. Normal diastolic filling  pattern, normal LAP.  Left atrial cavity is normal in size. An atrial septal aneurysm without a  patent foramen ovale is present.  Moderate (Grade III) aortic regurgitation.  Compared to 04/21/2021 no significant change.   Exercise Myoview stress test 04/27/2021:  1 Day Rest/Stress Protocol. Exercise time 8 minutes 44seconds on Bruce protocol, achieved 10.16 METS, and 86% of age-predicted maximum heart rate.  Stress ECG negative for ischemia.   Normal myocardial perfusion without evidence of reversible ischemia or prior infarct. Stress LVEF 64%.  All segments of the LV demonstrate normal wall motion and thickening. No prior studies available for comparison.  Left heart cath 09/07/2015:  The left ventricular systolic function is normal. Mild nonobstructive CAD. Left coronary system does vasodilate with intrcoronary nitroglycerin.   Continue aggressive preventive therapy.  She could have coronary vasospasm.  Consider adjusting medical therapy accordingly.  Will give a prescription of SL NTG.  F/u with Dr. Daleen Squibb.   Past Medical History:  Diagnosis Date   Allergy    Anxiety    Arthritis    Cancer (HCC)    bladder ca   COPD (chronic obstructive pulmonary disease) (HCC)  Coronary artery disease    Depression    GERD (gastroesophageal reflux disease)    History of kidney stones    Hyperlipidemia    Neuromuscular disorder (HCC) 2014   RSD - nerve blocks in back for treatment    Osteoporosis 09/05/2019    Past Surgical History:  Procedure Laterality Date   bladder cancer  2006   BLADDER TUMOR EXCISION  12/25/2004   BREAST SURGERY   "long time ago"   lumpectomy    CARDIAC CATHETERIZATION N/A 09/07/2015   Procedure: Left Heart Cath and Coronary Angiography;  Surgeon: Corky Crafts, MD;  Location: [facility] INVASIVE CV LAB;  Service: Cardiovascular;  Laterality: N/A;   CESAREAN SECTION  1982   ENDOMETRIAL ABLATION  12/25/1993    MEDICATIONS:  aspirin EC 81 MG tablet   atorvastatin (LIPITOR) 20 MG tablet   lubiprostone (AMITIZA) 24 MCG capsule   metoprolol succinate (TOPROL-XL) 25 MG 24 hr tablet   nitroGLYCERIN (NITROSTAT) 0.4 MG SL tablet   oxyCODONE-acetaminophen (PERCOCET/ROXICET) 5-325 MG tablet   TRELEGY ELLIPTA 100-62.5-25 MCG/ACT AEPB   No current facility-administered medications for this encounter.   Marcille Blanco [facility] Short Stay/Anesthesiology [facility] Phone [phone] 09/25/2023 1:53 PM

## 2023-09-25 NOTE — Anesthesia Preprocedure Evaluation (Signed)
Anesthesia Evaluation  Patient identified by MRN, date of birth, ID band Patient awake    Reviewed: Allergy & Precautions, NPO status , Patient's Chart, lab work & pertinent test results  Airway Mallampati: II  TM Distance: >3 FB Neck ROM: Full    Dental  (+) Partial Upper   Pulmonary COPD, Current Smoker   Pulmonary exam normal breath sounds clear to auscultation       Cardiovascular + CAD   Rhythm:Regular Rate:Normal + Systolic murmurs Echocardiogram 05/17/2022:    Normal LV systolic function with visual EF 55-60%. Left ventricle cavity is normal in size. Normal global wall motion. Normal diastolic filling  pattern, normal LAP.  Left atrial cavity is normal in size. An atrial septal aneurysm without a  patent foramen ovale is present.  Moderate (Grade III) aortic regurgitation.  Compared to 04/21/2021 no significant change.     Neuro/Psych  PSYCHIATRIC DISORDERS Anxiety Depression       GI/Hepatic Neg liver ROS,GERD  Medicated,,  Endo/Other  negative endocrine ROS    Renal/GU negative Renal ROS     Musculoskeletal  (+) Arthritis ,    Abdominal   Peds  Hematology negative hematology ROS (+)   Anesthesia Other Findings   Reproductive/Obstetrics                             Anesthesia Physical Anesthesia Plan  ASA: 4  Anesthesia Plan: General   Post-op Pain Management: Tylenol PO (pre-op)*   Induction: Intravenous  PONV Risk Score and Plan: 4 or greater and Ondansetron, Dexamethasone and Treatment may vary due to age or medical condition  Airway Management Planned: LMA  Additional Equipment:   Intra-op Plan:   Post-operative Plan: Extubation in OR  Informed Consent: I have reviewed the patients History and Physical, chart, labs and discussed the procedure including the risks, benefits and alternatives for the proposed anesthesia with the patient or authorized representative  who has indicated his/her understanding and acceptance.     Dental advisory given  Plan Discussed with: CRNA  Anesthesia Plan Comments: (See PAT note from 9/30 by Sherlie Ban PA-C )        Anesthesia Quick Evaluation

## 2023-09-26 ENCOUNTER — Ambulatory Visit (HOSPITAL_BASED_OUTPATIENT_CLINIC_OR_DEPARTMENT_OTHER): Payer: Medicare HMO

## 2023-09-26 ENCOUNTER — Ambulatory Visit (HOSPITAL_COMMUNITY): Payer: Self-pay | Admitting: Physician Assistant

## 2023-09-26 ENCOUNTER — Ambulatory Visit (HOSPITAL_COMMUNITY)
Admission: RE | Admit: 2023-09-26 | Discharge: 2023-09-26 | Disposition: A | Discharge status: Home / Self Care | Payer: Medicare HMO | Attending: Urology | Admitting: Urology

## 2023-09-26 ENCOUNTER — Encounter (HOSPITAL_COMMUNITY)
Admission: RE | Disposition: A | Discharge status: Home / Self Care | Payer: Self-pay | Source: Home / Self Care | Attending: Urology

## 2023-09-26 ENCOUNTER — Encounter (HOSPITAL_COMMUNITY): Payer: Self-pay | Admitting: Urology

## 2023-09-26 ENCOUNTER — Other Ambulatory Visit: Payer: Self-pay

## 2023-09-26 ENCOUNTER — Ambulatory Visit (HOSPITAL_COMMUNITY): Payer: Medicare HMO

## 2023-09-26 ENCOUNTER — Other Ambulatory Visit (HOSPITAL_COMMUNITY): Payer: Self-pay

## 2023-09-26 DIAGNOSIS — G8929 Other chronic pain: Secondary | ICD-10-CM | POA: Insufficient documentation

## 2023-09-26 DIAGNOSIS — F17210 Nicotine dependence, cigarettes, uncomplicated: Secondary | ICD-10-CM | POA: Diagnosis not present

## 2023-09-26 DIAGNOSIS — J449 Chronic obstructive pulmonary disease, unspecified: Secondary | ICD-10-CM | POA: Insufficient documentation

## 2023-09-26 DIAGNOSIS — N132 Hydronephrosis with renal and ureteral calculous obstruction: Secondary | ICD-10-CM | POA: Insufficient documentation

## 2023-09-26 DIAGNOSIS — I2510 Atherosclerotic heart disease of native coronary artery without angina pectoris: Secondary | ICD-10-CM

## 2023-09-26 DIAGNOSIS — N201 Calculus of ureter: Secondary | ICD-10-CM

## 2023-09-26 DIAGNOSIS — Z8551 Personal history of malignant neoplasm of bladder: Secondary | ICD-10-CM | POA: Diagnosis not present

## 2023-09-26 DIAGNOSIS — Z87442 Personal history of urinary calculi: Secondary | ICD-10-CM | POA: Insufficient documentation

## 2023-09-26 DIAGNOSIS — K219 Gastro-esophageal reflux disease without esophagitis: Secondary | ICD-10-CM | POA: Insufficient documentation

## 2023-09-26 HISTORY — PX: CYSTOSCOPY/URETEROSCOPY/HOLMIUM LASER/STENT PLACEMENT: SHX6546

## 2023-09-26 HISTORY — PX: CYSTOSCOPY WITH BIOPSY: SHX5122

## 2023-09-26 SURGERY — CYSTOSCOPY/URETEROSCOPY/HOLMIUM LASER/STENT PLACEMENT
Anesthesia: General | Site: Ureter

## 2023-09-26 MED ORDER — LIDOCAINE HCL (PF) 2 % IJ SOLN
INTRAMUSCULAR | Status: AC
  Filled 2023-09-26: qty 5

## 2023-09-26 MED ORDER — PHENYLEPHRINE 80 MCG/ML (10ML) SYRINGE FOR IV PUSH (FOR BLOOD PRESSURE SUPPORT)
PREFILLED_SYRINGE | INTRAVENOUS | Status: AC
  Filled 2023-09-26: qty 10

## 2023-09-26 MED ORDER — FENTANYL CITRATE (PF) 100 MCG/2ML IJ SOLN
INTRAMUSCULAR | Status: AC
  Filled 2023-09-26: qty 2

## 2023-09-26 MED ORDER — PROPOFOL 10 MG/ML IV BOLUS
INTRAVENOUS | Status: DC | PRN
Start: 2023-09-26 — End: 2023-09-26
  Administered 2023-09-26: 70 mg via INTRAVENOUS
  Administered 2023-09-26: 30 mg via INTRAVENOUS

## 2023-09-26 MED ORDER — 0.9 % SODIUM CHLORIDE (POUR BTL) OPTIME
TOPICAL | Status: DC | PRN
Start: 2023-09-26 — End: 2023-09-26
  Administered 2023-09-26: 1000 mL

## 2023-09-26 MED ORDER — OXYCODONE-ACETAMINOPHEN 5-325 MG PO TABS
1.0000 | ORAL_TABLET | Freq: Four times a day (QID) | ORAL | 0 refills | Status: DC | PRN
  Filled 2023-09-26: qty 10, 3d supply, fill #0

## 2023-09-26 MED ORDER — PROPOFOL 10 MG/ML IV BOLUS
INTRAVENOUS | Status: AC
  Filled 2023-09-26: qty 20

## 2023-09-26 MED ORDER — GLYCOPYRROLATE 0.2 MG/ML IJ SOLN
INTRAMUSCULAR | Status: DC | PRN
Start: 2023-09-26 — End: 2023-09-26

## 2023-09-26 MED ORDER — GLYCOPYRROLATE 0.2 MG/ML IJ SOLN
INTRAMUSCULAR | Status: AC
  Filled 2023-09-26: qty 1

## 2023-09-26 MED ORDER — DROPERIDOL 2.5 MG/ML IJ SOLN: 0.6250 mg | Freq: Once | INTRAMUSCULAR | Status: DC | PRN

## 2023-09-26 MED ORDER — SODIUM CHLORIDE 0.9 % IR SOLN
Status: DC | PRN
  Administered 2023-09-26: 6000 mL

## 2023-09-26 MED ORDER — ORAL CARE MOUTH RINSE: 15.0000 mL | Freq: Once | OROMUCOSAL | Status: AC

## 2023-09-26 MED ORDER — EPHEDRINE 5 MG/ML INJ
INTRAVENOUS | Status: AC
  Filled 2023-09-26: qty 5

## 2023-09-26 MED ORDER — KETOROLAC TROMETHAMINE 10 MG PO TABS
10.0000 mg | ORAL_TABLET | Freq: Three times a day (TID) | ORAL | 0 refills | Status: DC | PRN
  Filled 2023-09-26: qty 20, 7d supply, fill #0

## 2023-09-26 MED ORDER — LACTATED RINGERS IV SOLN: INTRAVENOUS | Status: DC

## 2023-09-26 MED ORDER — STERILE WATER FOR IRRIGATION IR SOLN
Status: DC | PRN
Start: 2023-09-26 — End: 2023-09-26
  Administered 2023-09-26: 1000 mL

## 2023-09-26 MED ORDER — IOHEXOL 300 MG/ML  SOLN
INTRAMUSCULAR | Status: DC | PRN
  Administered 2023-09-26: 1 mL

## 2023-09-26 MED ORDER — EPHEDRINE SULFATE-NACL 50-0.9 MG/10ML-% IV SOSY
PREFILLED_SYRINGE | INTRAVENOUS | Status: DC | PRN
Start: 2023-09-26 — End: 2023-09-26
  Administered 2023-09-26: 5 mg via INTRAVENOUS

## 2023-09-26 MED ORDER — GENTAMICIN SULFATE 40 MG/ML IJ SOLN
5.0000 mg/kg | INTRAVENOUS | Status: AC
  Administered 2023-09-26: 170 mg via INTRAVENOUS
  Filled 2023-09-26: qty 4.25

## 2023-09-26 MED ORDER — DEXAMETHASONE SODIUM PHOSPHATE 10 MG/ML IJ SOLN
INTRAMUSCULAR | Status: AC
  Filled 2023-09-26: qty 1

## 2023-09-26 MED ORDER — LIDOCAINE 2% (20 MG/ML) 5 ML SYRINGE
INTRAMUSCULAR | Status: DC | PRN
  Administered 2023-09-26: 40 mg via INTRAVENOUS

## 2023-09-26 MED ORDER — DEXAMETHASONE SODIUM PHOSPHATE 10 MG/ML IJ SOLN
INTRAMUSCULAR | Status: DC | PRN
  Administered 2023-09-26: 5 mg via INTRAVENOUS

## 2023-09-26 MED ORDER — CHLORHEXIDINE GLUCONATE 0.12 % MT SOLN
15.0000 mL | Freq: Once | OROMUCOSAL | Status: AC
  Administered 2023-09-26: 15 mL via OROMUCOSAL

## 2023-09-26 MED ORDER — ONDANSETRON HCL 4 MG/2ML IJ SOLN
INTRAMUSCULAR | Status: DC | PRN
  Administered 2023-09-26: 4 mg via INTRAVENOUS

## 2023-09-26 MED ORDER — FENTANYL CITRATE PF 50 MCG/ML IJ SOSY
25.0000 ug | PREFILLED_SYRINGE | INTRAMUSCULAR | Status: DC | PRN
  Administered 2023-09-26: 25 ug via INTRAVENOUS

## 2023-09-26 MED ORDER — ONDANSETRON HCL 4 MG/2ML IJ SOLN
INTRAMUSCULAR | Status: AC
  Filled 2023-09-26: qty 2

## 2023-09-26 MED ORDER — PHENYLEPHRINE 80 MCG/ML (10ML) SYRINGE FOR IV PUSH (FOR BLOOD PRESSURE SUPPORT)
PREFILLED_SYRINGE | INTRAVENOUS | Status: DC | PRN
Start: 2023-09-26 — End: 2023-09-26
  Administered 2023-09-26 (×2): 160 ug via INTRAVENOUS

## 2023-09-26 MED ORDER — FENTANYL CITRATE (PF) 100 MCG/2ML IJ SOLN
INTRAMUSCULAR | Status: DC | PRN
  Administered 2023-09-26: 25 ug via INTRAVENOUS

## 2023-09-26 MED ORDER — FENTANYL CITRATE PF 50 MCG/ML IJ SOSY
PREFILLED_SYRINGE | INTRAMUSCULAR | Status: AC
  Administered 2023-09-26: 25 ug via INTRAVENOUS
  Filled 2023-09-26: qty 1

## 2023-09-26 SURGICAL SUPPLY — 31 items
BAG DRN RND TRDRP ANRFLXCHMBR (UROLOGICAL SUPPLIES)
BAG URINE DRAIN 2000ML AR STRL (UROLOGICAL SUPPLIES) IMPLANT
BAG URO CATCHER STRL LF (MISCELLANEOUS) ×2 IMPLANT
BASKET LASER NITINOL 1.9FR (BASKET) IMPLANT
BSKT STON RTRVL 120 1.9FR (BASKET) ×2
CATH URETL OPEN END 6FR 70 (CATHETERS) ×2 IMPLANT
CLOTH BEACON ORANGE TIMEOUT ST (SAFETY) ×2 IMPLANT
DRAPE FOOT SWITCH (DRAPES) ×2 IMPLANT
ELECT REM PT RETURN 15FT ADLT (MISCELLANEOUS) ×2 IMPLANT
EVACUATOR MICROVAS BLADDER (UROLOGICAL SUPPLIES) IMPLANT
EXTRACTOR STONE 1.7FRX115CM (UROLOGICAL SUPPLIES) IMPLANT
GLOVE SURG LX STRL 7.5 STRW (GLOVE) ×2 IMPLANT
GOWN STRL REUS W/ TWL XL LVL3 (GOWN DISPOSABLE) ×2 IMPLANT
GOWN STRL REUS W/TWL XL LVL3 (GOWN DISPOSABLE) ×2
GUIDEWIRE ANG ZIPWIRE 038X150 (WIRE) ×2 IMPLANT
GUIDEWIRE STR DUAL SENSOR (WIRE) ×2 IMPLANT
KIT TURNOVER KIT A (KITS) IMPLANT
LASER FIB FLEXIVA PULSE ID 365 (Laser) IMPLANT
LOOP CUT BIPOLAR 24F LRG (ELECTROSURGICAL) IMPLANT
MANIFOLD NEPTUNE II (INSTRUMENTS) ×2 IMPLANT
PACK CYSTO (CUSTOM PROCEDURE TRAY) ×2 IMPLANT
PAD PREP 24X48 CUFFED NSTRL (MISCELLANEOUS) ×2 IMPLANT
SHEATH NAVIGATOR HD 11/13X28 (SHEATH) IMPLANT
SHEATH NAVIGATOR HD 11/13X36 (SHEATH) IMPLANT
SYR TOOMEY IRRIG 70ML (MISCELLANEOUS)
SYRINGE TOOMEY IRRIG 70ML (MISCELLANEOUS) IMPLANT
TRACTIP FLEXIVA PULS ID 200XHI (Laser) IMPLANT
TRACTIP FLEXIVA PULSE ID 200 (Laser)
TUBE PU 8FR 16IN ENFIT (TUBING) ×2 IMPLANT
TUBING CONNECTING 10 (TUBING) ×2 IMPLANT
TUBING UROLOGY SET (TUBING) ×2 IMPLANT

## 2023-09-26 NOTE — Brief Op Note (Signed)
09/26/2023  2:28 PM  PATIENT:  Candace Middleton  62 y.o. female  PRE-OPERATIVE DIAGNOSIS:  URETERAL STONE HISTORY OF BLADDER CANCER  POST-OPERATIVE DIAGNOSIS:  URETERAL STONE HISTORY OF BLADDER CANCER  PROCEDURE:  Procedure(s) with comments: CYSTOSCOPY LEFT RETROGRADE PYELOGRAM/URETEROSCOPY/BASKETING OF STONE (Left) CYSTOSCOPY WITH BLADDER  BIOPSY (N/A) - 45 MINS FOR CASE  SURGEON:  Surgeons and Role:    * Drina Jobst, Delbert Phenix., MD - Primary  PHYSICIAN ASSISTANT:   ASSISTANTS: none   ANESTHESIA:   general  EBL:  minimal   BLOOD ADMINISTERED:none  DRAINS: none   LOCAL MEDICATIONS USED:  NONE  SPECIMEN:  Source of Specimen:  stone fragmetns (discard); bladder biopsy  DISPOSITION OF SPECIMEN:  PATHOLOGY  COUNTS:  YES  TOURNIQUET:  * No tourniquets in log *  DICTATION: .Other Dictation: Dictation Number [id number]  PLAN OF CARE: Discharge to home after PACU  PATIENT DISPOSITION:  PACU - hemodynamically stable.   Delay start of Pharmacological VTE agent (>24hrs) due to surgical blood loss or risk of bleeding: yes

## 2023-09-26 NOTE — Op Note (Signed)
NAME: Candace Middleton, POPAL MEDICAL RECORD NO: [id number] ACCOUNT NO: [id number] DATE OF BIRTH: Oct 02, 1961 FACILITY: [facility] LOCATION: [facility] PHYSICIAN: Sebastian Ache, MD  Operative Report   DATE OF PROCEDURE: 09/26/2023  PREOPERATIVE DIAGNOSIS:  History of bladder cancer with irritative voiding, small left distal ureteral stone.  POSTOPERATIVE DIAGNOSIS:  History of bladder cancer with irritative voiding, small left distal ureteral stone.  PROCEDURE PERFORMED: 1.  Cystoscopy with left retrograde pyelogram interpretation. 2.  Left ureteroscopy with basketing of stone. 3.  Bladder biopsy, fulguration.  ESTIMATED BLOOD LOSS:  Nil.  COMPLICATIONS:  None.  SPECIMEN: 1.  Tiny size bladder stone fragments for discard. 2.  Bladder biopsy.  History of bladder cancer.  FINDINGS: 1.  Very small, less than 2 mm left UVJ stone with mild hydronephrosis. 2.  Complete resolution of all accessible stone fragments larger than one-third mm on the left side following basket extraction. 3.  Nonplacement of left ureteral stent as the ureter was quite capacious following stone extraction. 4.  No evidence of worrisome bladder erythema or papillary lesions in the bladder.  INDICATIONS:  The patient is a pleasant 62 year old lady with remote history of bladder cancer.  She continues to smoke.  She does have some irritative voiding as well and has culture negative.  She was found on workup of colicky flank pain to have an  incredibly small left distal ureteral stone with mild hydronephrosis.  Options were discussed for management including recommended path of ureteroscopy with goal of stone free and concomitant bladder biopsy to maximally rule out any carcinoma in situ or other  urothelial etiology to her irritative voiding and she wished to proceed.  Informed consent was obtained and placed in medical record.  PROCEDURE IN DETAIL:  The patient being Candace Middleton and procedure being left  ureteroscopy with goal of stone manipulation, and bladder biopsies confirmed.  Procedure timeout was performed.  Intravenous antibiotics were administered.  General LMA  anesthesia induced.  The patient was placed into a low lithotomy position.  Sterile field was created, prepped and draped the patient's vagina, introitus, and proximal thighs using iodine.  Cystourethroscopy was performed using 21-French rigid cystoscope  with offset lens.  Inspection of urinary bladder revealed no diverticula, calcifications, papillary lesions.  Ureteral orifices were single.  There was no patchy red erythema or papillary lesions whatsoever.  A tiny calcification was seen crowning at  the left UVJ.  The left ureteral orifice was cannulated with 6-French end-hole catheter, and very gentle left retrograde pyelogram was obtained.  Left retrograde pyelogram demonstrated a single left ureter, single system left kidney, mild hydronephrosis noted.  A ZIPwire was advanced in lower pole kidney set aside as a safety wire.  An 8-French feeding tube placed in the urinary bladder for  pressure release and semirigid ureteroscopy was performed of the distal left ureter alongside a separate sensor working wire.  As anticipated a very tiny stone was encountered at the area of the UVJ.  There was some mild mucosal edema and swelling around  this consistent with some impaction.  The stone was quite small, estimated to be 2 mm or less.  Exquisite care was taken to basket the stone without retrograde positioning.  However, given the tiny nature of the stone, unfortunately was retrograde  positioned up towards the area of the kidney.  Therefore, the semirigid scope was exchanged for a single channel flexible ureteroscope, which was carefully navigated under direct vision to the level of the kidney and the small stone in  question was  encountered and retrograde positioned into an upper mid calix.  This was grasped with the Escape basket and  using the basket alone caused essentially disintegration of this very small calcification.  There was essentially no retrievable fragments.  It  did have a characteristic appearance of calcium oxalate, all calices of the kidney was inspected x3, no evidence of additional calcifications were noted.  The ureteroscope was carefully removed under continuous vision.  There was only mild mucosal edema at the  area of prior stone impaction.  However, given the subtle nature and lack of hydronephrosis, it was not felt that interval stenting would be warranted.  The cystoscope was then once again employed.  Bladder inspected and irrigation was changed to water  and cold cup biopsy forceps was used to obtain representative sample of the anterior superior trigone area for biopsy, base of this fulgurated.  Exquisite care was taken to include really essentially just mucosal sampling, no evidence of perforation was  noted.  Bladder was empty per cystoscope.  Procedure was then terminated.  The patient tolerated the procedure well, no immediate periprocedural complications.  The patient was taken to postanesthesia care unit in stable condition.  Plan for discharge  home.   SHY D: 09/26/2023 2:34:20 pm T: 09/26/2023 9:41:00 pm  JOB: [id number]/ [id number]

## 2023-09-26 NOTE — Anesthesia Postprocedure Evaluation (Signed)
Anesthesia Post Note  Patient: Candace Middleton  Procedure(s) Performed: CYSTOSCOPY LEFT RETROGRADE PYELOGRAM/URETEROSCOPY/BASKETING OF STONE (Left: Ureter) CYSTOSCOPY WITH BLADDER  BIOPSY (Bladder)     Patient location during evaluation: PACU Anesthesia Type: General Level of consciousness: sedated and patient cooperative Pain management: pain level controlled Vital Signs Assessment: post-procedure vital signs reviewed and stable Respiratory status: spontaneous breathing Cardiovascular status: stable Anesthetic complications: no   No notable events documented.  Last Vitals:  Vitals:   09/26/23 1515 09/26/23 1526  BP: 108/63 119/63  Pulse: 65 60  Resp: 17 14  Temp:  36.4 C  SpO2: 98% 97%    Last Pain:  Vitals:   09/26/23 1526  TempSrc:   PainSc: 2                  Lewie Loron

## 2023-09-26 NOTE — Anesthesia Procedure Notes (Signed)
Procedure Name: LMA Insertion Date/Time: 09/26/2023 1:56 PM  Performed by: Florene Route, CRNAPre-anesthesia Checklist: Patient identified Patient Re-evaluated:Patient Re-evaluated prior to induction Oxygen Delivery Method: Circle system utilized Preoxygenation: Pre-oxygenation with 100% oxygen Induction Type: IV induction LMA: LMA inserted LMA Size: 3.0 Number of attempts: 1 Placement Confirmation: positive ETCO2 and breath sounds checked- equal and bilateral Tube secured with: Tape Dental Injury: Teeth and Oropharynx as per pre-operative assessment

## 2023-09-26 NOTE — H&P (Signed)
Candace Middleton is an 62 y.o. female.    Chief Complaint: Pre-Op LEFT Ureteroscopic Stone Manipulation, Possible Bladder Biopsy/Fulgeration  HPI:    1 - Bladder Cancer - s/p TURBT 05/2005 for TaG1 tumor by Evans found on w/u of lower urinary tract symptoms. Had induction BCG afterwards and followed on/off until 2008. 35PY smoker, still smokes. ?? ?Recent Surveillance:  ?01/2018 - surv cysto - NED  ?05/2023 - surv cysto - No recurrence ?  ??2 - Recurrenct Cystitis - few episodes poer year simpel cystitis. NO febriel / pyelo episodes. PVR 05/2023 88mL (Normal), Pelvic w/o prolapse or severe atrophy.  3 - Left Distal Ureteral Stone - 2mm left UVJ stone with mild hydro on ER CT 08/2023. Stone is solitary.   PMH sig for ortho surgery, chronic pain/narcotics. Takes care of disabled brother and son. No CV disease. ??  Today Candace Middleton is seen  to proceed with LEFT ureteroscopic stone manipulation for distal stone. She also has h/o bladder cancer and irritative voiding. Most recetn UCX negative. Npo interval fevers.    Past Medical History:  Diagnosis Date   Allergy    Anxiety    Arthritis    Cancer (HCC)    bladder ca   COPD (chronic obstructive pulmonary disease) (HCC)    Coronary artery disease    Depression    GERD (gastroesophageal reflux disease)    History of kidney stones    Hyperlipidemia    Neuromuscular disorder (HCC) 2014   RSD - nerve blocks in back for treatment    Osteoporosis 09/05/2019    Past Surgical History:  Procedure Laterality Date   bladder cancer  2006   BLADDER TUMOR EXCISION  12/25/2004   BREAST SURGERY  "long time ago"   lumpectomy    CARDIAC CATHETERIZATION N/A 09/07/2015   Procedure: Left Heart Cath and Coronary Angiography;  Surgeon: Corky Crafts, MD;  Location: [facility] INVASIVE CV LAB;  Service: Cardiovascular;  Laterality: N/A;   CESAREAN SECTION  1982   ENDOMETRIAL ABLATION  12/25/1993    Family History  Problem Relation Age of Onset    Heart disease Mother    Thyroid disease Sister    Melanoma Sister    Diabetes Brother    Epilepsy Son    Early death Father    Pulmonary embolism Father    Thyroid disease Brother    Colon cancer Neg Hx    Social History:  reports that she has been smoking cigarettes. She has a 15 pack-year smoking history. She has never used smokeless tobacco. She reports that she does not drink alcohol and does not use drugs.  Allergies:  Allergies  Allergen Reactions   Gabapentin Hives    Patient reports facial swelling, hives, facial involvement    Sulfa Antibiotics Anaphylaxis    Onset at age 60, "almost died because I stopped breathing."    No medications prior to admission.    No results found for this or any previous visit (from the past 48 hour(s)). No results found.  Review of Systems  Constitutional:  Negative for chills and fever.  Genitourinary:  Positive for hematuria.  All other systems reviewed and are negative.   There were no vitals taken for this visit. Physical Exam Vitals reviewed.  HENT:     Head: Normocephalic.  Eyes:     Pupils: Pupils are equal, round, and reactive to light.  Cardiovascular:     Rate and Rhythm: Normal rate.  Abdominal:     General: Abdomen  is flat.     Comments: Stable very very thin abdomen  Genitourinary:    Comments: Minimal left CVAT at present Musculoskeletal:        General: Normal range of motion.     Cervical back: Normal range of motion.  Skin:    General: Skin is warm.  Neurological:     General: No focal deficit present.     Mental Status: She is alert.  Psychiatric:        Mood and Affect: Mood normal.      Assessment/Plan  Proceed as planned with LEFT ureteroscopic stone manipulation. May consider concomitant baldder biopsy if any worriseom chnges / erythema to max r/o CIS or other urothelial neoplasm that may be compounding her irritative voiding. Risks ,benefits, alternatives, expected peri-op course discussedin  detail.   Loletta Parish., MD 09/26/2023, 5:43 AM

## 2023-09-26 NOTE — Discharge Instructions (Addendum)
1 - You may have urinary urgency (bladder spasms) and bloody urine on / off for up to a week. This is normal.  2 - Call MD or go to ER for fever >102, severe pain / nausea / vomiting not relieved by medications, or acute change in medical status

## 2023-09-26 NOTE — Transfer of Care (Signed)
Immediate Anesthesia Transfer of Care Note  Patient: Clydia Llano  Procedure(s) Performed: CYSTOSCOPY LEFT RETROGRADE PYELOGRAM/URETEROSCOPY/BASKETING OF STONE (Left: Ureter) CYSTOSCOPY WITH BLADDER  BIOPSY (Bladder)  Patient Location: PACU  Anesthesia Type:General  Level of Consciousness: awake, alert , and oriented  Airway & Oxygen Therapy: Patient Spontanous Breathing and Patient connected to face mask oxygen  Post-op Assessment: Report given to RN and Post -op Vital signs reviewed and stable  Post vital signs: Reviewed and stable  Last Vitals:  Vitals Value Taken Time  BP    Temp    Pulse 69 09/26/23 1442  Resp 19 09/26/23 1442  SpO2 100 % 09/26/23 1442  Vitals shown include unfiled device data.  Last Pain:  Vitals:   09/26/23 1258  TempSrc: Oral  PainSc:          Complications: No notable events documented.

## 2023-09-27 ENCOUNTER — Encounter (HOSPITAL_COMMUNITY): Payer: Self-pay | Admitting: Urology

## 2023-09-27 ENCOUNTER — Other Ambulatory Visit (HOSPITAL_COMMUNITY): Payer: Self-pay

## 2023-09-27 LAB — SURGICAL PATHOLOGY

## 2024-01-06 ENCOUNTER — Other Ambulatory Visit: Payer: Self-pay | Admitting: Cardiology

## 2024-01-06 DIAGNOSIS — R072 Precordial pain: Secondary | ICD-10-CM

## 2024-01-07 ENCOUNTER — Other Ambulatory Visit: Payer: Self-pay | Admitting: Cardiology

## 2024-01-07 DIAGNOSIS — R072 Precordial pain: Secondary | ICD-10-CM

## 2024-03-04 ENCOUNTER — Other Ambulatory Visit: Payer: Self-pay | Admitting: Cardiology

## 2024-03-04 DIAGNOSIS — R072 Precordial pain: Secondary | ICD-10-CM

## 2024-04-02 ENCOUNTER — Telehealth (HOSPITAL_COMMUNITY): Payer: Self-pay | Admitting: Emergency Medicine

## 2024-04-02 ENCOUNTER — Ambulatory Visit (INDEPENDENT_AMBULATORY_CARE_PROVIDER_SITE_OTHER)

## 2024-04-02 ENCOUNTER — Encounter (HOSPITAL_COMMUNITY): Payer: Self-pay | Admitting: *Deleted

## 2024-04-02 ENCOUNTER — Other Ambulatory Visit: Payer: Self-pay

## 2024-04-02 ENCOUNTER — Ambulatory Visit (HOSPITAL_COMMUNITY): Admission: EM | Admit: 2024-04-02 | Discharge: 2024-04-02 | Disposition: A | Discharge status: Home / Self Care

## 2024-04-02 DIAGNOSIS — J441 Chronic obstructive pulmonary disease with (acute) exacerbation: Secondary | ICD-10-CM

## 2024-04-02 MED ORDER — IPRATROPIUM-ALBUTEROL 0.5-2.5 (3) MG/3ML IN SOLN
RESPIRATORY_TRACT | Status: AC
  Filled 2024-04-02: qty 3

## 2024-04-02 MED ORDER — IPRATROPIUM-ALBUTEROL 0.5-2.5 (3) MG/3ML IN SOLN
3.0000 mL | Freq: Once | RESPIRATORY_TRACT | Status: AC
  Administered 2024-04-02: 3 mL via RESPIRATORY_TRACT

## 2024-04-02 MED ORDER — AMOXICILLIN-POT CLAVULANATE 875-125 MG PO TABS: 1.0000 | ORAL_TABLET | Freq: Two times a day (BID) | ORAL | 0 refills | Status: AC

## 2024-04-02 MED ORDER — AZITHROMYCIN 250 MG PO TABS: 250.0000 mg | ORAL_TABLET | Freq: Every day | ORAL | 0 refills | Status: DC

## 2024-04-02 MED ORDER — ALBUTEROL SULFATE (2.5 MG/3ML) 0.083% IN NEBU: 2.5000 mg | INHALATION_SOLUTION | Freq: Four times a day (QID) | RESPIRATORY_TRACT | 0 refills | Status: DC | PRN

## 2024-04-02 MED ORDER — PREDNISONE 20 MG PO TABS: 40.0000 mg | ORAL_TABLET | Freq: Every day | ORAL | 0 refills | Status: AC

## 2024-04-02 NOTE — Telephone Encounter (Signed)
 Radiology suspicious for developing infiltrate, will cover for PNA w/ adding azithromycin to Augmentin.  Called patient, did not answer.

## 2024-04-02 NOTE — ED Triage Notes (Signed)
 PT reports a cough for a few weeks. Pt has a HX of COPD. Pt has SHOB with walking. Pt has been using her inhalers .

## 2024-04-02 NOTE — Telephone Encounter (Signed)
 Called and spoke to patient, name and identity verified with at least 2 patient identifiers, patient in agreement to treatment plan of starting Augmentin in addition to the azithromycin for pneumonia coverage.

## 2024-04-02 NOTE — ED Provider Notes (Addendum)
 [facility]    CSN: [id number] Arrival date & time: 04/02/24  1327      History   Chief Complaint Chief Complaint  Patient presents with   Cough    HPI Candace Middleton is a 63 y.o. female.   Patient presents to clinic over concerns of cough, wheezing and shortness of breath that has been ongoing for the past few weeks.  Initially she thought her symptoms were allergies but gradually her symptoms have been worsening to when she wakes up in the morning she will be hypoxic with ambulating from her bedroom to the kitchen, has home pulse oximeter.   Does have a history of COPD.  Has been using her Breztri inhaler twice daily and albuterol inhaler as needed.  She feels like she cannot get a deep inhale and that the medicine is not really helping her.  Has not had any fevers.  Cough is productive with phlegm. Denies COPD exacerbations in the last 6-12 months or hospitalizations.   The history is provided by the patient and medical records.  Cough   Past Medical History:  Diagnosis Date   Allergy    Anxiety    Arthritis    Cancer (HCC)    bladder ca   COPD (chronic obstructive pulmonary disease) (HCC)    Coronary artery disease    Depression    GERD (gastroesophageal reflux disease)    History of kidney stones    Hyperlipidemia    Neuromuscular disorder (HCC) 2014   RSD - nerve blocks in back for treatment    Osteoporosis 09/05/2019    Patient Active Problem List   Diagnosis Date Noted   Osteoporosis 09/05/2019   COPD (chronic obstructive pulmonary disease) (HCC) 02/05/2018   Depression 02/05/2018   Coronary artery vasospasm (HCC) 09/22/2015   Precordial pain    Coronary artery disease 09/01/2015   Hyperlipidemia LDL goal <100 09/01/2015   Tobacco use disorder 09/01/2015   History of bladder cancer 04/26/2015   History of tobacco use 06/24/2013   RSD (reflex sympathetic dystrophy) 06/24/2013   Complex regional pain syndrome of left lower extremity  06/24/2013   Neuromuscular disorder (HCC) 2014    Past Surgical History:  Procedure Laterality Date   bladder cancer  2006   BLADDER TUMOR EXCISION  12/25/2004   BREAST SURGERY  "long time ago"   lumpectomy    CARDIAC CATHETERIZATION N/A 09/07/2015   Procedure: Left Heart Cath and Coronary Angiography;  Surgeon: Corky Crafts, MD;  Location: [facility] INVASIVE CV LAB;  Service: Cardiovascular;  Laterality: N/A;   CESAREAN SECTION  1982   CYSTOSCOPY WITH BIOPSY N/A 09/26/2023   Procedure: CYSTOSCOPY WITH BLADDER  BIOPSY;  Surgeon: Loletta Parish., MD;  Location: [facility];  Service: Urology;  Laterality: N/A;  45 MINS FOR CASE   CYSTOSCOPY/URETEROSCOPY/HOLMIUM LASER/STENT PLACEMENT Left 09/26/2023   Procedure: CYSTOSCOPY LEFT RETROGRADE PYELOGRAM/URETEROSCOPY/BASKETING OF STONE;  Surgeon: Loletta Parish., MD;  Location: [facility];  Service: Urology;  Laterality: Left;   ENDOMETRIAL ABLATION  12/25/1993    OB History   No obstetric history on file.      Home Medications    Prior to Admission medications   Medication Sig Start Date End Date Taking? Authorizing Provider  albuterol (PROVENTIL) (2.5 MG/3ML) 0.083% nebulizer solution Take 3 mLs (2.5 mg total) by nebulization every 6 (six) hours as needed for wheezing or shortness of breath. 04/02/24  Yes Rinaldo Ratel, [place] N, FNP  amoxicillin-clavulanate (AUGMENTIN) 875-125 MG tablet  Take 1 tablet by mouth every 12 (twelve) hours for 7 days. 04/02/24 04/09/24 Yes Rinaldo Ratel, [place] N, FNP  BREZTRI AEROSPHERE 160-9-4.8 MCG/ACT AERO inhaler Inhale 2 puffs into the lungs. 01/23/24  Yes [provider]  metoprolol succinate (TOPROL-XL) 25 MG 24 hr tablet TAKE 1/2 TABLET(12.5 MG) BY MOUTH EVERY MORNING 03/04/24  Yes Tolia, Sunit, DO  oxyCODONE-acetaminophen (PERCOCET/ROXICET) 5-325 MG tablet Take 1 tablet by mouth every 6 (six) hours as needed for severe pain. Post-operatively. 09/26/23  Yes Manny, Delbert Phenix., MD  predniSONE (DELTASONE)  20 MG tablet Take 2 tablets (40 mg total) by mouth daily for 5 days. 04/02/24 04/07/24 Yes Elle Vezina, [place] N, FNP  TRELEGY ELLIPTA 100-62.5-25 MCG/ACT AEPB Take 1 puff by mouth in the morning. 05/19/22  Yes [provider]  albuterol (VENTOLIN HFA) 108 (90 Base) MCG/ACT inhaler Inhale 2 puffs into the lungs every 6 (six) hours as needed.    [provider]  aspirin EC 81 MG tablet Take 1 tablet (81 mg total) by mouth daily. 08/13/15   Vivianne Master, PA-C  atorvastatin (LIPITOR) 20 MG tablet TAKE 1 TABLET BY MOUTH EVERY DAY Patient taking differently: Take 20 mg by mouth every evening. 06/22/20   Cresenzo, Cyndi Lennert, MD  azithromycin (ZITHROMAX) 250 MG tablet Take 1 tablet (250 mg total) by mouth daily. Take first 2 tablets together, then 1 every day until finished. 04/02/24   Kegan Mckeithan, [place] N, FNP  ketorolac (TORADOL) 10 MG tablet Take 1 tablet (10 mg total) by mouth every 8 (eight) hours as needed for moderate pain. Post-operatively 09/26/23   Loletta Parish., MD  lubiprostone (AMITIZA) 24 MCG capsule Take 24 mcg by mouth 2 (two) times daily with a meal.    [provider]  nitroGLYCERIN (NITROSTAT) 0.4 MG SL tablet PLACE 1 TABLET UNDER TONGUE EVERY 5 MINUTES AS NEEDED FOR CHEST PAIN.IF MORE THAN 2 TABLETS ARE REQUIRED CALL EMS AND GO TO ER. Patient not taking: No sig reported 05/09/21   Tessa Lerner, DO    Family History Family History  Problem Relation Age of Onset   Heart disease Mother    Thyroid disease Sister    Melanoma Sister    Diabetes Brother    Epilepsy Son    Early death Father    Pulmonary embolism Father    Thyroid disease Brother    Colon cancer Neg Hx     Social History Social History   Tobacco Use   Smoking status: Every Day    Current packs/day: 0.50    Average packs/day: 0.5 packs/day for 30.0 years (15.0 ttl pk-yrs)    Types: Cigarettes   Smokeless tobacco: Never  Vaping Use   Vaping status: Never Used  Substance Use Topics    Alcohol use: No   Drug use: No     Allergies   Gabapentin and Sulfa antibiotics   Review of Systems Review of Systems  Per HPI  Physical Exam Triage Vital Signs ED Triage Vitals  Encounter Vitals Group     BP 04/02/24 1451 132/70     Systolic BP Percentile --      Diastolic BP Percentile --      Pulse Rate 04/02/24 1451 77     Resp 04/02/24 1451 20     Temp 04/02/24 1451 98 F (36.7 C)     Temp src --      SpO2 04/02/24 1451 90 %     Weight --  Height --      Head Circumference --      Peak Flow --      Pain Score 04/02/24 1444 0     Pain Loc --      Pain Education --      Exclude from Growth Chart --    No data found.  Updated Vital Signs BP 132/70   Pulse 77   Temp 98 F (36.7 C)   Resp 20   SpO2 90%   Visual Acuity Right Eye Distance:   Left Eye Distance:   Bilateral Distance:    Right Eye Near:   Left Eye Near:    Bilateral Near:     Physical Exam Vitals and nursing note reviewed.  Constitutional:      Appearance: Normal appearance.  HENT:     Head: Normocephalic and atraumatic.     Right Ear: External ear normal.     Left Ear: External ear normal.     Nose: Nose normal.     Mouth/Throat:     Mouth: Mucous membranes are moist.  Eyes:     Conjunctiva/sclera: Conjunctivae normal.  Cardiovascular:     Rate and Rhythm: Normal rate and regular rhythm.     Heart sounds: Normal heart sounds. No murmur heard. Pulmonary:     Effort: Pulmonary effort is normal. No respiratory distress.     Breath sounds: Decreased air movement present. Wheezing and rhonchi present.  Neurological:     General: No focal deficit present.     Mental Status: She is alert.  Psychiatric:        Mood and Affect: Mood normal.        Behavior: Behavior is cooperative.      UC Treatments / Results  Labs (all labs ordered are listed, but only abnormal results are displayed) Labs Reviewed - No data to display  EKG   Radiology DG Chest 2 View Result Date:  04/02/2024 CLINICAL DATA:  Cough, COPD exacerbation EXAM: CHEST - 2 VIEW COMPARISON:  Chest x-ray performed March 20, 2020 FINDINGS: A subtle focal opacity is present overlying the right costophrenic angle which is agitated. Lungs are hyperexpanded. Heart mediastinum are not significantly changed. IMPRESSION: A focal opacity is suspected in the right lung base which is suspicious for developing infiltrate. Electronically Signed   By: Lowell Guitar M.D.   On: 04/02/2024 16:18    Procedures Procedures (including critical care time)  Medications Ordered in UC Medications  ipratropium-albuterol (DUONEB) 0.5-2.5 (3) MG/3ML nebulizer solution 3 mL (3 mLs Nebulization Given 04/02/24 1531)    Initial Impression / Assessment and Plan / UC Course  I have reviewed the triage vital signs and the nursing notes.  Pertinent labs & imaging results that were available during my care of the patient were reviewed by me and considered in my medical decision making (see chart for details).  Vitals in triage reviewed, patient is hemodynamically stable.  Initially oxygenation 90% on room air, diffuse diminished lung sounds with wheezing and rhonchi.  Oxygenation improved to 96% after DuoNeb, significant wheezing and rhonchi remained.  Chest x-ray by my interpretation shows hyperinflation and no obvious infiltrates.  Due to symptoms and presentation will cover for COPD exacerbation with Augmentin and prednisone.  Will send home with nebulizer machine, had good results with this in clinic.  Plan of care, follow-up care return precautions given, no questions at this time.  Radiology interpretation suspicious for an early pneumonia, will cover with azithromycin in addition to  Augmentin for pneumonia coverage.     Final Clinical Impressions(s) / UC Diagnoses   Final diagnoses:  COPD exacerbation (HCC)     Discharge Instructions      Take the antibiotics twice daily with food until finished.  Start the steroids  today and then daily with breakfast.  You can use the nebulizer solution every 6 hours as needed for wheezing or shortness of breath.  Symptoms should improve over the next few days, if no improvement or any changes return to clinic or seek immediate care at the nearest emergency department.     ED Prescriptions     Medication Sig Dispense Auth. Provider   predniSONE (DELTASONE) 20 MG tablet Take 2 tablets (40 mg total) by mouth daily for 5 days. 10 tablet Rinaldo Ratel, [place] N, FNP   amoxicillin-clavulanate (AUGMENTIN) 875-125 MG tablet Take 1 tablet by mouth every 12 (twelve) hours for 7 days. 14 tablet Rinaldo Ratel, [place] N, [place]   albuterol (PROVENTIL) (2.5 MG/3ML) 0.083% nebulizer solution Take 3 mLs (2.5 mg total) by nebulization every 6 (six) hours as needed for wheezing or shortness of breath. 75 mL Shaton Lore, [place] N, [place]      PDMP not reviewed this encounter.   Allecia Bells, [place] N, FNP 04/02/24 1555    Arien Morine, [place] N, [place] 04/02/24 [phone]

## 2024-04-02 NOTE — Discharge Instructions (Addendum)
 Take the antibiotics twice daily with food until finished.  Start the steroids today and then daily with breakfast.  You can use the nebulizer solution every 6 hours as needed for wheezing or shortness of breath.  Symptoms should improve over the next few days, if no improvement or any changes return to clinic or seek immediate care at the nearest emergency department.

## 2024-04-08 ENCOUNTER — Other Ambulatory Visit: Payer: Self-pay | Admitting: Cardiology

## 2024-04-08 DIAGNOSIS — R072 Precordial pain: Secondary | ICD-10-CM

## 2024-04-14 ENCOUNTER — Inpatient Hospital Stay (HOSPITAL_COMMUNITY)

## 2024-04-14 ENCOUNTER — Encounter (HOSPITAL_COMMUNITY): Payer: Self-pay

## 2024-04-14 ENCOUNTER — Other Ambulatory Visit: Payer: Self-pay

## 2024-04-14 ENCOUNTER — Ambulatory Visit (HOSPITAL_COMMUNITY): Admission: EM | Admit: 2024-04-14 | Discharge: 2024-04-14 | Disposition: A | Discharge status: Home / Self Care

## 2024-04-14 ENCOUNTER — Emergency Department (HOSPITAL_COMMUNITY)

## 2024-04-14 ENCOUNTER — Inpatient Hospital Stay (HOSPITAL_COMMUNITY)
Admission: EM | Admit: 2024-04-14 | Discharge: 2024-04-18 | DRG: 871 | Disposition: A | Discharge status: Home / Self Care | Attending: Internal Medicine | Admitting: Internal Medicine

## 2024-04-14 DIAGNOSIS — I2510 Atherosclerotic heart disease of native coronary artery without angina pectoris: Secondary | ICD-10-CM | POA: Diagnosis present

## 2024-04-14 DIAGNOSIS — Z716 Tobacco abuse counseling: Secondary | ICD-10-CM

## 2024-04-14 DIAGNOSIS — I351 Nonrheumatic aortic (valve) insufficiency: Secondary | ICD-10-CM | POA: Diagnosis present

## 2024-04-14 DIAGNOSIS — Z888 Allergy status to other drugs, medicaments and biological substances status: Secondary | ICD-10-CM

## 2024-04-14 DIAGNOSIS — J441 Chronic obstructive pulmonary disease with (acute) exacerbation: Secondary | ICD-10-CM | POA: Diagnosis present

## 2024-04-14 DIAGNOSIS — E782 Mixed hyperlipidemia: Secondary | ICD-10-CM

## 2024-04-14 DIAGNOSIS — R6520 Severe sepsis without septic shock: Secondary | ICD-10-CM | POA: Diagnosis present

## 2024-04-14 DIAGNOSIS — Z882 Allergy status to sulfonamides status: Secondary | ICD-10-CM | POA: Diagnosis not present

## 2024-04-14 DIAGNOSIS — Z7951 Long term (current) use of inhaled steroids: Secondary | ICD-10-CM

## 2024-04-14 DIAGNOSIS — Z8249 Family history of ischemic heart disease and other diseases of the circulatory system: Secondary | ICD-10-CM

## 2024-04-14 DIAGNOSIS — A419 Sepsis, unspecified organism: Secondary | ICD-10-CM | POA: Diagnosis present

## 2024-04-14 DIAGNOSIS — D72829 Elevated white blood cell count, unspecified: Secondary | ICD-10-CM | POA: Diagnosis present

## 2024-04-14 DIAGNOSIS — F17210 Nicotine dependence, cigarettes, uncomplicated: Secondary | ICD-10-CM | POA: Diagnosis present

## 2024-04-14 DIAGNOSIS — R0902 Hypoxemia: Secondary | ICD-10-CM

## 2024-04-14 DIAGNOSIS — I10 Essential (primary) hypertension: Secondary | ICD-10-CM | POA: Diagnosis present

## 2024-04-14 DIAGNOSIS — R0602 Shortness of breath: Secondary | ICD-10-CM | POA: Diagnosis present

## 2024-04-14 DIAGNOSIS — Z720 Tobacco use: Secondary | ICD-10-CM | POA: Diagnosis present

## 2024-04-14 DIAGNOSIS — Z7982 Long term (current) use of aspirin: Secondary | ICD-10-CM

## 2024-04-14 DIAGNOSIS — Z79899 Other long term (current) drug therapy: Secondary | ICD-10-CM | POA: Diagnosis not present

## 2024-04-14 DIAGNOSIS — J440 Chronic obstructive pulmonary disease with acute lower respiratory infection: Secondary | ICD-10-CM | POA: Diagnosis present

## 2024-04-14 DIAGNOSIS — J9601 Acute respiratory failure with hypoxia: Secondary | ICD-10-CM | POA: Diagnosis present

## 2024-04-14 DIAGNOSIS — Z1152 Encounter for screening for COVID-19: Secondary | ICD-10-CM

## 2024-04-14 DIAGNOSIS — R7881 Bacteremia: Secondary | ICD-10-CM | POA: Diagnosis not present

## 2024-04-14 DIAGNOSIS — E785 Hyperlipidemia, unspecified: Secondary | ICD-10-CM | POA: Diagnosis present

## 2024-04-14 DIAGNOSIS — Z8551 Personal history of malignant neoplasm of bladder: Secondary | ICD-10-CM | POA: Diagnosis not present

## 2024-04-14 DIAGNOSIS — J13 Pneumonia due to Streptococcus pneumoniae: Secondary | ICD-10-CM | POA: Diagnosis present

## 2024-04-14 DIAGNOSIS — B957 Other staphylococcus as the cause of diseases classified elsewhere: Secondary | ICD-10-CM | POA: Diagnosis not present

## 2024-04-14 DIAGNOSIS — Z960 Presence of urogenital implants: Secondary | ICD-10-CM | POA: Diagnosis present

## 2024-04-14 DIAGNOSIS — J189 Pneumonia, unspecified organism: Principal | ICD-10-CM | POA: Diagnosis present

## 2024-04-14 DIAGNOSIS — D72823 Leukemoid reaction: Secondary | ICD-10-CM | POA: Diagnosis not present

## 2024-04-14 LAB — BASIC METABOLIC PANEL WITH GFR
Anion gap: 11 (ref 5–15)
BUN: 9 mg/dL (ref 8–23)
CO2: 22 mmol/L (ref 22–32)
Calcium: 8.7 mg/dL — ABNORMAL LOW (ref 8.9–10.3)
Chloride: 100 mmol/L (ref 98–111)
Creatinine, Ser: 0.73 mg/dL (ref 0.44–1.00)
GFR, Estimated: >60 mL/min (ref >60)
Glucose, Bld: 96 mg/dL (ref 70–99)
Potassium: 3.9 mmol/L (ref 3.5–5.1)
Sodium: 133 mmol/L — ABNORMAL LOW (ref 135–145)

## 2024-04-14 LAB — PROCALCITONIN: Procalcitonin: 0.15 ng/mL

## 2024-04-14 LAB — RESP PANEL BY RT-PCR (RSV, FLU A&B, COVID)  RVPGX2
Influenza A by PCR: NEGATIVE
Influenza B by PCR: NEGATIVE
Resp Syncytial Virus by PCR: NEGATIVE
SARS Coronavirus 2 by RT PCR: NEGATIVE

## 2024-04-14 LAB — CBC
HCT: 44.9 % (ref 36.0–46.0)
Hemoglobin: 15.2 g/dL — ABNORMAL HIGH (ref 12.0–15.0)
MCH: 31.5 pg (ref 26.0–34.0)
MCHC: 33.9 g/dL (ref 30.0–36.0)
MCV: 93.2 fL (ref 80.0–100.0)
Platelets: 323 10*3/uL (ref 150–400)
RBC: 4.82 MIL/uL (ref 3.87–5.11)
RDW: 13.4 % (ref 11.5–15.5)
WBC: 29.6 10*3/uL — ABNORMAL HIGH (ref 4.0–10.5)
nRBC: 0 % (ref 0.0–0.2)

## 2024-04-14 LAB — MAGNESIUM: Magnesium: 1.7 mg/dL (ref 1.7–2.4)

## 2024-04-14 LAB — I-STAT CG4 LACTIC ACID, ED: Lactic Acid, Venous: 1 mmol/L (ref 0.5–1.9)

## 2024-04-14 MED ORDER — ALBUTEROL SULFATE (2.5 MG/3ML) 0.083% IN NEBU: 2.5000 mg | INHALATION_SOLUTION | RESPIRATORY_TRACT | Status: DC | PRN

## 2024-04-14 MED ORDER — SODIUM CHLORIDE 0.9 % IV SOLN
100.0000 mg | Freq: Once | INTRAVENOUS | Status: AC
  Administered 2024-04-14: 100 mg via INTRAVENOUS
  Filled 2024-04-14: qty 100

## 2024-04-14 MED ORDER — BENZONATATE 100 MG PO CAPS
200.0000 mg | ORAL_CAPSULE | Freq: Three times a day (TID) | ORAL | Status: DC | PRN
  Administered 2024-04-15 – 2024-04-16 (×2): 200 mg via ORAL
  Filled 2024-04-14 (×2): qty 2

## 2024-04-14 MED ORDER — METHYLPREDNISOLONE SODIUM SUCC 125 MG IJ SOLR
INTRAMUSCULAR | Status: AC
  Filled 2024-04-14: qty 2

## 2024-04-14 MED ORDER — ONDANSETRON HCL 4 MG/2ML IJ SOLN: 4.0000 mg | Freq: Four times a day (QID) | INTRAMUSCULAR | Status: DC | PRN

## 2024-04-14 MED ORDER — SODIUM CHLORIDE 0.9 % IV BOLUS
1000.0000 mL | Freq: Once | INTRAVENOUS | Status: AC
  Administered 2024-04-14: 1000 mL via INTRAVENOUS

## 2024-04-14 MED ORDER — SODIUM CHLORIDE 0.9 % IV SOLN
1.0000 g | INTRAVENOUS | Status: DC
  Administered 2024-04-15: 1 g via INTRAVENOUS
  Filled 2024-04-14: qty 10

## 2024-04-14 MED ORDER — ACETAMINOPHEN 325 MG PO TABS
650.0000 mg | ORAL_TABLET | Freq: Four times a day (QID) | ORAL | Status: DC | PRN
  Administered 2024-04-15: 650 mg via ORAL
  Filled 2024-04-14: qty 2

## 2024-04-14 MED ORDER — SODIUM CHLORIDE 0.9 % IV SOLN
1.0000 g | Freq: Once | INTRAVENOUS | Status: AC
  Administered 2024-04-14: 1 g via INTRAVENOUS
  Filled 2024-04-14: qty 10

## 2024-04-14 MED ORDER — IPRATROPIUM-ALBUTEROL 0.5-2.5 (3) MG/3ML IN SOLN
RESPIRATORY_TRACT | Status: AC
  Filled 2024-04-14: qty 3

## 2024-04-14 MED ORDER — ACETAMINOPHEN 325 MG PO TABS
650.0000 mg | ORAL_TABLET | Freq: Once | ORAL | Status: AC
  Administered 2024-04-14: 650 mg via ORAL
  Filled 2024-04-14: qty 2

## 2024-04-14 MED ORDER — LACTATED RINGERS IV SOLN: INTRAVENOUS | Status: DC

## 2024-04-14 MED ORDER — GUAIFENESIN ER 600 MG PO TB12
600.0000 mg | ORAL_TABLET | Freq: Two times a day (BID) | ORAL | Status: DC
  Administered 2024-04-14 – 2024-04-18 (×8): 600 mg via ORAL
  Filled 2024-04-14 (×9): qty 1

## 2024-04-14 MED ORDER — IPRATROPIUM-ALBUTEROL 0.5-2.5 (3) MG/3ML IN SOLN
3.0000 mL | Freq: Four times a day (QID) | RESPIRATORY_TRACT | Status: DC
  Administered 2024-04-14 – 2024-04-15 (×2): 3 mL via RESPIRATORY_TRACT
  Filled 2024-04-14 (×2): qty 3

## 2024-04-14 MED ORDER — ACETAMINOPHEN 650 MG RE SUPP: 650.0000 mg | Freq: Four times a day (QID) | RECTAL | Status: DC | PRN

## 2024-04-14 MED ORDER — IPRATROPIUM-ALBUTEROL 0.5-2.5 (3) MG/3ML IN SOLN
3.0000 mL | Freq: Once | RESPIRATORY_TRACT | Status: AC
  Administered 2024-04-14: 3 mL via RESPIRATORY_TRACT

## 2024-04-14 MED ORDER — SODIUM CHLORIDE 0.9 % IV SOLN
100.0000 mg | Freq: Two times a day (BID) | INTRAVENOUS | Status: DC
  Administered 2024-04-15 (×2): 100 mg via INTRAVENOUS
  Filled 2024-04-14 (×3): qty 100

## 2024-04-14 MED ORDER — METHYLPREDNISOLONE SODIUM SUCC 125 MG IJ SOLR
80.0000 mg | Freq: Two times a day (BID) | INTRAMUSCULAR | Status: DC
  Administered 2024-04-14 – 2024-04-15 (×2): 80 mg via INTRAVENOUS
  Filled 2024-04-14 (×2): qty 2

## 2024-04-14 MED ORDER — MELATONIN 3 MG PO TABS: 3.0000 mg | ORAL_TABLET | Freq: Every evening | ORAL | Status: DC | PRN

## 2024-04-14 MED ORDER — METHYLPREDNISOLONE SODIUM SUCC 125 MG IJ SOLR
125.0000 mg | Freq: Once | INTRAMUSCULAR | Status: AC
  Administered 2024-04-14: 125 mg via INTRAVENOUS

## 2024-04-14 NOTE — ED Notes (Signed)
[facility] notified of need for transport to ED. 

## 2024-04-14 NOTE — ED Triage Notes (Signed)
 Patient bib [facility] from urgent care for low oxygen saturation. Patient has been treated for a COPD for the past 2 weeks she was not feeling any better and also coughed so hard she felt a pop. She took antibiotics, prednisone , and nebulizer's every 6 hours and went to follow up today and her oxygen was 81% and had a fever of 101. Urgent care put her on 4L nasal cannula and gave solu-medrol  and a duoneb and oxygen saturation is 91%.   Patient is alert and oriented x4 on arrival to ED. Denies shortness of breath and chest pain.

## 2024-04-14 NOTE — ED Triage Notes (Signed)
 Was seen in [facility] 04/02/24 -- completed course abx and prednisone ; pt states she had f/u with PCP this AM & was told she needs to return to [facility] for CXR, as she has contined having significant cough, and felt "a pop" in left lateral chest when coughing yesterday AM. States has been using nebs regularly.

## 2024-04-14 NOTE — H&P (Signed)
 History and Physical      Candace Middleton:[id number] DOB: 03-04-1961 DOA: 04/14/2024; DOS: 04/14/2024  PCP: Abelino Hof, FNP *** Patient coming from: home ***  I have personally briefly reviewed patient's old medical records in [facility]  Chief Complaint: ***  HPI: Candace Middleton is a 63 y.o. female with medical history significant for *** who is admitted to [facility] on 04/14/2024 with *** after presenting from home*** to [facility] ED complaining of ***.    ***       ***   ED Course:  Vital signs in the ED were notable for the following: ***  Labs were notable for the following: ***  Per my interpretation, EKG in ED demonstrated the following:  ***  Imaging in the ED, per corresponding formal radiology read, was notable for the following:  ***  While in the ED, the following were administered: ***  Subsequently, the patient was admitted  ***  ***red    Review of Systems: As per HPI otherwise 10 point review of systems negative.   Past Medical History:  Diagnosis Date   Allergy    Anxiety    Arthritis    Cancer (HCC)    bladder ca   COPD (chronic obstructive pulmonary disease) (HCC)    Coronary artery disease    Depression    GERD (gastroesophageal reflux disease)    History of kidney stones    Hyperlipidemia    Neuromuscular disorder (HCC) 2014   RSD - nerve blocks in back for treatment    Osteoporosis 09/05/2019    Past Surgical History:  Procedure Laterality Date   bladder cancer  2006   BLADDER TUMOR EXCISION  12/25/2004   BREAST SURGERY  "long time ago"   lumpectomy    CARDIAC CATHETERIZATION N/A 09/07/2015   Procedure: Left Heart Cath and Coronary Angiography;  Surgeon: Lucendia Rusk, MD;  Location: [facility] INVASIVE CV LAB;  Service: Cardiovascular;  Laterality: N/A;   CESAREAN SECTION  1982   CYSTOSCOPY WITH BIOPSY N/A 09/26/2023   Procedure: CYSTOSCOPY WITH BLADDER  BIOPSY;  Surgeon: Melody Spurling., MD;  Location: [facility] ORS;  Service: Urology;  Laterality: N/A;  45 MINS FOR CASE   CYSTOSCOPY/URETEROSCOPY/HOLMIUM LASER/STENT PLACEMENT Left 09/26/2023   Procedure: CYSTOSCOPY LEFT RETROGRADE PYELOGRAM/URETEROSCOPY/BASKETING OF STONE;  Surgeon: Melody Spurling., MD;  Location: [facility];  Service: Urology;  Laterality: Left;   ENDOMETRIAL ABLATION  12/25/1993    Social History:  reports that she has been smoking cigarettes. She has a 15 pack-year smoking history. She has never used smokeless tobacco. She reports that she does not drink alcohol  and does not use drugs.   Allergies  Allergen Reactions   Gabapentin  Hives    Patient reports facial swelling, hives, facial involvement    Sulfa Antibiotics Anaphylaxis    Onset at age 4, "almost died because I stopped breathing."    Family History  Problem Relation Age of Onset   Heart disease Mother    Thyroid  disease Sister    Melanoma Sister    Diabetes Brother    Epilepsy Son    Early death Father    Pulmonary embolism Father    Thyroid  disease Brother    Colon cancer Neg Hx     Family history reviewed and not pertinent ***   Prior to Admission medications   Medication Sig Start Date End Date Taking? Authorizing Provider  albuterol  (PROVENTIL ) (2.5 MG/3ML) 0.083% nebulizer solution Take  3 mLs (2.5 mg total) by nebulization every 6 (six) hours as needed for wheezing or shortness of breath. 04/02/24   Harlow Lighter, [place]  N, FNP  albuterol  (VENTOLIN  HFA) 108 (90 Base) MCG/ACT inhaler Inhale 2 puffs into the lungs every 6 (six) hours as needed.    [provider]  aspirin  EC 81 MG tablet Take 1 tablet (81 mg total) by mouth daily. 08/13/15   Lynne Sat, PA-C  atorvastatin  (LIPITOR) 20 MG tablet TAKE 1 TABLET BY MOUTH EVERY DAY Patient taking differently: Take 20 mg by mouth every evening. 06/22/20   Cresenzo, Mardee Shackle, MD  azithromycin  (ZITHROMAX ) 250 MG tablet Take 1 tablet (250 mg total) by mouth daily. Take first 2  tablets together, then 1 every day until finished. 04/02/24   Harlow Lighter, [place]  N, FNP  BREZTRI  AEROSPHERE 160-9-4.8 MCG/ACT AERO inhaler Inhale 2 puffs into the lungs. 01/23/24   [provider]  ketorolac  (TORADOL ) 10 MG tablet Take 1 tablet (10 mg total) by mouth every 8 (eight) hours as needed for moderate pain. Post-operatively 09/26/23   Melody Spurling., MD  lubiprostone (AMITIZA) 24 MCG capsule Take 24 mcg by mouth 2 (two) times daily with a meal.    [provider]  metoprolol  succinate (TOPROL -XL) 25 MG 24 hr tablet TAKE 1/2 TABLET(12.5 MG) BY MOUTH EVERY MORNING 04/08/24   Tolia, Sunit, DO  nitroGLYCERIN  (NITROSTAT ) 0.4 MG SL tablet PLACE 1 TABLET UNDER TONGUE EVERY 5 MINUTES AS NEEDED FOR CHEST PAIN.IF MORE THAN 2 TABLETS ARE REQUIRED CALL EMS AND GO TO ER. Patient not taking: No sig reported 05/09/21   Albert Huff, Sunit, DO  oxyCODONE -acetaminophen  (PERCOCET/ROXICET) 5-325 MG tablet Take 1 tablet by mouth every 6 (six) hours as needed for severe pain. Post-operatively. 09/26/23   Melody Spurling., MD  TRELEGY ELLIPTA 100-62.5-25 MCG/ACT AEPB Take 1 puff by mouth in the morning. 05/19/22   [provider]     Objective    Physical Exam: Vitals:   04/14/24 1848 04/14/24 1859 04/14/24 1900 04/14/24 2015  BP:  107/60  103/63  Pulse:  99  82  Resp:  (!) 26  18  Temp:  100.3 F (37.9 C)  99.2 F (37.3 C)  TempSrc:    Oral  SpO2: 91% 93%  97%  Weight:   34.9 kg   Height:   4\' 11"  (1.499 m)     General: appears to be stated age; alert, oriented Skin: warm, dry, no rash Head:  AT/[place] Mouth:  Oral mucosa membranes appear moist, normal dentition Neck: supple; trachea midline Heart:  RRR; did not appreciate any M/R/G Lungs: CTAB, did not appreciate any wheezes, rales, or rhonchi Abdomen: + BS; soft, ND, NT Vascular: 2+ pedal pulses b/l; 2+ radial pulses b/l Extremities: no peripheral edema, no muscle wasting Neuro: strength and sensation intact in upper  and lower extremities b/l ***   *** Neuro: 5/5 strength of the proximal and distal flexors and extensors of the upper and lower extremities bilaterally; sensation intact in upper and lower extremities b/l; cranial nerves II through XII grossly intact; no pronator drift; no evidence suggestive of slurred speech, dysarthria, or facial droop; Normal muscle tone. No tremors.  *** Neuro: In the setting of the patient's current mental status and associated inability to follow instructions, unable to perform full neurologic exam at this time.  As such, assessment of strength, sensation, and cranial nerves is limited at this time. Patient noted to spontaneously move all 4 extremities. No  tremors.  ***    Labs on Admission: I have personally reviewed following labs and imaging studies  CBC: Recent Labs  Lab 04/14/24 1904  WBC 29.6*  HGB 15.2*  HCT 44.9  MCV 93.2  PLT 323   Basic Metabolic Panel: Recent Labs  Lab 04/14/24 1904  NA 133*  K 3.9  CL 100  CO2 22  GLUCOSE 96  BUN 9  CREATININE 0.73  CALCIUM  8.7*   GFR: Estimated Creatinine Clearance: 38.6 mL/min (by C-G formula based on SCr of 0.73 mg/dL). Liver Function Tests: No results for input(s): "AST", "ALT", "ALKPHOS", "BILITOT", "PROT", "ALBUMIN" in the last 168 hours. No results for input(s): "LIPASE", "AMYLASE" in the last 168 hours. No results for input(s): "AMMONIA" in the last 168 hours. Coagulation Profile: No results for input(s): "INR", "PROTIME" in the last 168 hours. Cardiac Enzymes: No results for input(s): "CKTOTAL", "CKMB", "CKMBINDEX", "TROPONINI" in the last 168 hours. BNP (last 3 results) No results for input(s): "PROBNP" in the last 8760 hours. HbA1C: No results for input(s): "HGBA1C" in the last 72 hours. CBG: No results for input(s): "GLUCAP" in the last 168 hours. Lipid Profile: No results for input(s): "CHOL", "HDL", "LDLCALC", "TRIG", "CHOLHDL", "LDLDIRECT" in the last 72 hours. Thyroid  Function  Tests: No results for input(s): "TSH", "T4TOTAL", "FREET4", "T3FREE", "THYROIDAB" in the last 72 hours. Anemia Panel: No results for input(s): "VITAMINB12", "FOLATE", "FERRITIN", "TIBC", "IRON", "RETICCTPCT" in the last 72 hours. Urine analysis:    Component Value Date/Time   COLORURINE AMBER (A) 09/17/2023 0210   APPEARANCEUR HAZY (A) 09/17/2023 0210   LABSPEC 1.028 09/17/2023 0210   PHURINE 5.0 09/17/2023 0210   GLUCOSEU NEGATIVE 09/17/2023 0210   HGBUR LARGE (A) 09/17/2023 0210   BILIRUBINUR NEGATIVE 09/17/2023 0210   BILIRUBINUR neg 08/23/2015 1233   KETONESUR 5 (A) 09/17/2023 0210   PROTEINUR NEGATIVE 09/17/2023 0210   UROBILINOGEN 0.2 08/23/2015 1233   UROBILINOGEN 0.2 06/21/2015 1337   NITRITE NEGATIVE 09/17/2023 0210   LEUKOCYTESUR NEGATIVE 09/17/2023 0210    Radiological Exams on Admission: No results found.    Assessment/Plan   Principal Problem:   CAP (community acquired pneumonia)   ***            ***                  ***                   ***                  ***                  ***                  ***                   ***                  ***                  ***                  ***                  ***                 ***                ***  DVT  prophylaxis: SCD's ***  Code Status: Full code*** Family Communication: none*** Disposition Plan: Per Rounding Team Consults called: none***;  Admission status: ***     I SPENT GREATER THAN 75 *** MINUTES IN CLINICAL CARE TIME/MEDICAL DECISION-MAKING IN COMPLETING THIS ADMISSION.      Gattis Kass Deveney Bayon DO [facility]  From 7PM - 7AM   04/14/2024, 9:45 PM   ***

## 2024-04-14 NOTE — Discharge Instructions (Signed)
-  Sent to [facility] health ER for further evaluation and management of severe hypoxia with history of COPD, likely COPD exacerbation or secondary community-acquired pneumonia. -[facility] ambulance service called for transport to emergency department for higher level of care. -Solu-Medrol  125 mg IV push given in UC for acute COPD exacerbation with hypoxia. -DuoNeb nebulizer treatment provided in UC for acute dyspnea secondary to COPD exacerbation. -Patient placed on 3 L of oxygen while resting in room, oxygen saturation 86% on 3 L. -Patient reports feeling much better once receiving oxygen therapy.

## 2024-04-14 NOTE — ED Provider Notes (Signed)
 [facility]  Note:  This document was prepared using Dragon voice recognition software and may include unintentional dictation errors.  MRN: [id number] DOB: [birth year]  Subjective:   Candace Middleton is a 63 y.o. female presenting for increasing shortness of breath, cough, left lateral chest wall pain.  Patient reports that she was seen on April 9 and diagnosed with pneumonia.  Patient completed course of antibiotics and prednisone  was seen by PCP this morning and told she needed to go to urgent care for evaluation based on low O2 sats and pain in the lateral chest.  Patient has history of COPD and uses neb treatments regularly.  Patients O2 saturation 81% at arrival to urgent care on room air, fever of 101 F.  No current facility-administered medications for this encounter.  Current Outpatient Medications:    albuterol  (PROVENTIL ) (2.5 MG/3ML) 0.083% nebulizer solution, Take 3 mLs (2.5 mg total) by nebulization every 6 (six) hours as needed for wheezing or shortness of breath., Disp: 75 mL, Rfl: 0   albuterol  (VENTOLIN  HFA) 108 (90 Base) MCG/ACT inhaler, Inhale 2 puffs into the lungs every 6 (six) hours as needed., Disp: , Rfl:    aspirin  EC 81 MG tablet, Take 1 tablet (81 mg total) by mouth daily., Disp: 30 tablet, Rfl: 3   atorvastatin  (LIPITOR) 20 MG tablet, TAKE 1 TABLET BY MOUTH EVERY DAY (Patient taking differently: Take 20 mg by mouth every evening.), Disp: 90 tablet, Rfl: 3   azithromycin  (ZITHROMAX ) 250 MG tablet, Take 1 tablet (250 mg total) by mouth daily. Take first 2 tablets together, then 1 every day until finished., Disp: 6 tablet, Rfl: 0   BREZTRI  AEROSPHERE 160-9-4.8 MCG/ACT AERO inhaler, Inhale 2 puffs into the lungs., Disp: , Rfl:    ketorolac  (TORADOL ) 10 MG tablet, Take 1 tablet (10 mg total) by mouth every 8 (eight) hours as needed for moderate pain. Post-operatively, Disp: 20 tablet, Rfl: 0   lubiprostone (AMITIZA) 24 MCG capsule, Take 24 mcg by mouth 2  (two) times daily with a meal., Disp: , Rfl:    metoprolol  succinate (TOPROL -XL) 25 MG 24 hr tablet, TAKE 1/2 TABLET(12.5 MG) BY MOUTH EVERY MORNING, Disp: 15 tablet, Rfl: 0   nitroGLYCERIN  (NITROSTAT ) 0.4 MG SL tablet, PLACE 1 TABLET UNDER TONGUE EVERY 5 MINUTES AS NEEDED FOR CHEST PAIN.IF MORE THAN 2 TABLETS ARE REQUIRED CALL EMS AND GO TO ER. (Patient not taking: No sig reported), Disp: 25 tablet, Rfl: 2   oxyCODONE -acetaminophen  (PERCOCET/ROXICET) 5-325 MG tablet, Take 1 tablet by mouth every 6 (six) hours as needed for severe pain. Post-operatively., Disp: 10 tablet, Rfl: 0   TRELEGY ELLIPTA 100-62.5-25 MCG/ACT AEPB, Take 1 puff by mouth in the morning., Disp: , Rfl:    Allergies  Allergen Reactions   Gabapentin  Hives    Patient reports facial swelling, hives, facial involvement    Sulfa Antibiotics Anaphylaxis    Onset at age 49, "almost died because I stopped breathing."    Past Medical History:  Diagnosis Date   Allergy    Anxiety    Arthritis    Cancer (HCC)    bladder ca   COPD (chronic obstructive pulmonary disease) (HCC)    Coronary artery disease    Depression    GERD (gastroesophageal reflux disease)    History of kidney stones    Hyperlipidemia    Neuromuscular disorder (HCC) 2014   RSD - nerve blocks in back for treatment    Osteoporosis 09/05/2019     Past  Surgical History:  Procedure Laterality Date   bladder cancer  2006   BLADDER TUMOR EXCISION  12/25/2004   BREAST SURGERY  "long time ago"   lumpectomy    CARDIAC CATHETERIZATION N/A 09/07/2015   Procedure: Left Heart Cath and Coronary Angiography;  Surgeon: Lucendia Rusk, MD;  Location: [facility] INVASIVE CV LAB;  Service: Cardiovascular;  Laterality: N/A;   CESAREAN SECTION  1982   CYSTOSCOPY WITH BIOPSY N/A 09/26/2023   Procedure: CYSTOSCOPY WITH BLADDER  BIOPSY;  Surgeon: Melody Spurling., MD;  Location: [facility];  Service: Urology;  Laterality: N/A;  45 MINS FOR CASE   CYSTOSCOPY/URETEROSCOPY/HOLMIUM  LASER/STENT PLACEMENT Left 09/26/2023   Procedure: CYSTOSCOPY LEFT RETROGRADE PYELOGRAM/URETEROSCOPY/BASKETING OF STONE;  Surgeon: Melody Spurling., MD;  Location: [facility];  Service: Urology;  Laterality: Left;   ENDOMETRIAL ABLATION  12/25/1993    Family History  Problem Relation Age of Onset   Heart disease Mother    Thyroid  disease Sister    Melanoma Sister    Diabetes Brother    Epilepsy Son    Early death Father    Pulmonary embolism Father    Thyroid  disease Brother    Colon cancer Neg Hx     Social History   Tobacco Use   Smoking status: Every Day    Current packs/day: 0.50    Average packs/day: 0.5 packs/day for 30.0 years (15.0 ttl pk-yrs)    Types: Cigarettes   Smokeless tobacco: Never  Vaping Use   Vaping status: Never Used  Substance Use Topics   Alcohol  use: No   Drug use: No    ROS Refer to HPI for ROS details.  Objective:   Vitals: BP 130/62   Pulse (!) 104   Temp (!) 101 F (38.3 C) (Oral)   Resp (!) 22   SpO2 (!) 87%   Physical Exam Vitals and nursing note reviewed.  Constitutional:      General: She is not in acute distress.    Appearance: Normal appearance. She is well-developed. She is not ill-appearing or toxic-appearing.  HENT:     Head: Normocephalic and atraumatic.  Cardiovascular:     Rate and Rhythm: Regular rhythm. Tachycardia present.     Heart sounds: No murmur heard. Pulmonary:     Effort: Pulmonary effort is normal. No respiratory distress.     Breath sounds: Normal breath sounds. No stridor. No wheezing, rhonchi or rales.  Chest:     Chest wall: Tenderness present.  Musculoskeletal:        General: Normal range of motion.  Skin:    General: Skin is warm and dry.  Neurological:     General: No focal deficit present.     Mental Status: She is alert and oriented to person, place, and time.  Psychiatric:        Mood and Affect: Mood normal.        Behavior: Behavior normal.     Procedures  No results found for  this or any previous visit (from the past 24 hours).  No results found.   Assessment and Plan :     Discharge Instructions      -Sent to [facility] health ER for further evaluation and management of severe hypoxia with history of COPD, likely COPD exacerbation or secondary community-acquired pneumonia. -[facility] ambulance service called for transport to emergency department for higher level of care. -Solu-Medrol  125 mg IV push given in UC for acute COPD exacerbation with hypoxia. -DuoNeb nebulizer treatment  provided in UC for acute dyspnea secondary to COPD exacerbation. -Patient placed on 3 L of oxygen while resting in room, oxygen saturation 86% on 3 L. -Patient reports feeling much better once receiving oxygen therapy.      Artis Beggs B Canaan Holzer   Shayona Hibbitts, [place] B, [place] 04/14/24 1827

## 2024-04-14 NOTE — ED Provider Notes (Addendum)
 [place] EMERGENCY DEPARTMENT AT [facility] Provider Note   CSN: [id number] Arrival date & time: 04/14/24  1847     History  CC: Shortness of breath   Candace Middleton is a 63 y.o. female with history of COPD presented to ED with shortness of breath and hypoxia.  Patient arrives from urgent care today with concern for low oxygen level.  She reports that 2 weeks ago she was diagnosed with a possible right-sided pneumonia and has completed a course of Augmentin  as well as azithromycin  as well as prednisone .  She said that 2 days ago on Saturday she felt a "pop" in the left side of her chest and has had difficulty breathing since then.  She does not wear oxygen at home.  I reviewed external records and urgent care evaluation with the patient was given which 25 mg of IV Solu-Medrol  for possible acute COPD exacerbation and a DuoNeb treatment.  HPI     Home Medications Prior to Admission medications   Medication Sig Start Date End Date Taking? Authorizing Provider  albuterol  (PROVENTIL ) (2.5 MG/3ML) 0.083% nebulizer solution Take 3 mLs (2.5 mg total) by nebulization every 6 (six) hours as needed for wheezing or shortness of breath. 04/02/24   Harlow Lighter, [place]  N, FNP  albuterol  (VENTOLIN  HFA) 108 (90 Base) MCG/ACT inhaler Inhale 2 puffs into the lungs every 6 (six) hours as needed.    [provider]  aspirin  EC 81 MG tablet Take 1 tablet (81 mg total) by mouth daily. 08/13/15   Lynne Sat, PA-C  atorvastatin  (LIPITOR) 20 MG tablet TAKE 1 TABLET BY MOUTH EVERY DAY Patient taking differently: Take 20 mg by mouth every evening. 06/22/20   Cresenzo, John V, MD  azithromycin  (ZITHROMAX ) 250 MG tablet Take 1 tablet (250 mg total) by mouth daily. Take first 2 tablets together, then 1 every day until finished. 04/02/24   Harlow Lighter, [place]  N, FNP  BREZTRI  AEROSPHERE 160-9-4.8 MCG/ACT AERO inhaler Inhale 2 puffs into the lungs. 01/23/24   [provider]  ketorolac   (TORADOL ) 10 MG tablet Take 1 tablet (10 mg total) by mouth every 8 (eight) hours as needed for moderate pain. Post-operatively 09/26/23   Melody Spurling., MD  lubiprostone (AMITIZA) 24 MCG capsule Take 24 mcg by mouth 2 (two) times daily with a meal.    [provider]  metoprolol  succinate (TOPROL -XL) 25 MG 24 hr tablet TAKE 1/2 TABLET(12.5 MG) BY MOUTH EVERY MORNING 04/08/24   Tolia, Sunit, DO  nitroGLYCERIN  (NITROSTAT ) 0.4 MG SL tablet PLACE 1 TABLET UNDER TONGUE EVERY 5 MINUTES AS NEEDED FOR CHEST PAIN.IF MORE THAN 2 TABLETS ARE REQUIRED CALL EMS AND GO TO ER. Patient not taking: No sig reported 05/09/21   Tolia, Sunit, DO  oxyCODONE -acetaminophen  (PERCOCET/ROXICET) 5-325 MG tablet Take 1 tablet by mouth every 6 (six) hours as needed for severe pain. Post-operatively. 09/26/23   Melody Spurling., MD  TRELEGY ELLIPTA 100-62.5-25 MCG/ACT AEPB Take 1 puff by mouth in the morning. 05/19/22   [provider]      Allergies    Gabapentin  and Sulfa antibiotics    Review of Systems   Review of Systems  Physical Exam Updated Vital Signs BP 103/63   Pulse 82   Temp 99.2 F (37.3 C) (Oral)   Resp 18   Ht 4\' 11"  (1.499 m)   Wt 34.9 kg   SpO2 97%   BMI 15.55 kg/m  Physical Exam Constitutional:  General: She is not in acute distress.    Comments: Thin, cachectic appearing  HENT:     Head: Normocephalic and atraumatic.  Eyes:     Conjunctiva/sclera: Conjunctivae normal.     Pupils: Pupils are equal, round, and reactive to light.  Cardiovascular:     Rate and Rhythm: Normal rate and regular rhythm.  Pulmonary:     Effort: Pulmonary effort is normal. No respiratory distress.     Comments: Absent breath sounds left side, 93% O2 saturation on 4L [place], no tachypnea, patient speaking full sentences Abdominal:     General: There is no distension.     Tenderness: There is no abdominal tenderness.  Skin:    General: Skin is warm and dry.  Neurological:      General: No focal deficit present.     Mental Status: She is alert. Mental status is at baseline.  Psychiatric:        Mood and Affect: Mood normal.        Behavior: Behavior normal.     ED Results / Procedures / Treatments   Labs (all labs ordered are listed, but only abnormal results are displayed) Labs Reviewed  BASIC METABOLIC PANEL WITH GFR - Abnormal; Notable for the following components:      Result Value   Sodium 133 (*)    Calcium  8.7 (*)    All other components within normal limits  CBC - Abnormal; Notable for the following components:   WBC 29.6 (*)    Hemoglobin 15.2 (*)    All other components within normal limits  RESP PANEL BY RT-PCR (RSV, FLU A&B, COVID)  [id number]  CULTURE, BLOOD (ROUTINE X 2)  CULTURE, BLOOD (ROUTINE X 2)  I-STAT CG4 LACTIC ACID, ED    EKG None  Radiology No results found.  Procedures .Critical Care  Performed by: Arvilla Birmingham, MD Authorized by: Arvilla Birmingham, MD   Critical care provider statement:    Critical care time (minutes):  45   Critical care time was exclusive of:  Separately billable procedures and treating other patients   Critical care was necessary to treat or prevent imminent or life-threatening deterioration of the following conditions:  Sepsis   Critical care was time spent personally by me on the following activities:  Ordering and performing treatments and interventions, ordering and review of laboratory studies, ordering and review of radiographic studies, pulse oximetry, review of old charts, examination of patient and evaluation of patient's response to treatment     Medications Ordered in ED Medications  cefTRIAXone  (ROCEPHIN ) 1 g in sodium chloride  0.9 % 100 mL IVPB (1 g Intravenous New Bag/Given 04/14/24 2058)  doxycycline  (VIBRAMYCIN ) 100 mg in sodium chloride  0.9 % 250 mL IVPB (has no administration in time range)  sodium chloride  0.9 % bolus 1,000 mL (1,000 mLs Intravenous New Bag/Given 04/14/24 2058)   acetaminophen  (TYLENOL ) tablet 650 mg (650 mg Oral Given 04/14/24 2057)    ED Course/ Medical Decision Making/ A&P Clinical Course as of 04/14/24 2121  Mon Apr 14, 2024  2121 Admitted to hospitalist [MT]    Clinical Course User Index [MT] Arvilla Birmingham, MD                                 Medical Decision Making Amount and/or Complexity of Data Reviewed Labs: ordered. Radiology: ordered.  Risk OTC drugs. Decision regarding hospitalization.   This patient presents to  the ED with concern for shortness of breath, hypoxia. This involves an extensive number of treatment options, and is a complaint that carries with it a high risk of complications and morbidity.  The differential diagnosis includes recurring pneumonia versus pneumothorax versus COPD exacerbation versus pleural effusion versus other  Co-morbidities that complicate the patient evaluation: History of emphysema, smoking, COPD, at risk of COPD exacerbation   External records from outside source obtained and reviewed including urgent care evaluation  I ordered and personally interpreted labs.  The pertinent results include: White blood cell count 29.  BMP unremarkable.  Lactate and blood cultures pending  I ordered imaging studies including x-ray of the chest I independently visualized and interpreted imaging which showed likely persistent right lower lobe infiltrate I agree with the radiologist interpretation  CT of the chest was ordered to better evaluate potential underlying infection and is pending at the time of admission  The patient was maintained on a cardiac monitor.  I personally viewed and interpreted the cardiac monitored which showed an underlying rhythm of: Sinus rhythm  Per my interpretation the patient's ECG shows no acute ischemic findings  I ordered medication including IV Rocephin  and doxycycline  for pneumonia, IV fluid bolus for sepsis I have reviewed the patients home medicines and have made  adjustments as needed  Test Considered: Lower suspicion for acute PE given that the patient is febrile with a leukocytosis, more likely infectious etiology  After the interventions noted above, I reevaluated the patient and found that they have: improved    Dispostion:  After consideration of the diagnostic results and the patients response to treatment, I feel that the patent would benefit from medical admission for likely persistent underlying infection, with failed outpatient treatment and antibiotics, and new oxygen requirement as well.         Final Clinical Impression(s) / ED Diagnoses Final diagnoses:  Pneumonia due to infectious organism, unspecified laterality, unspecified part of lung  Hypoxia    Rx / DC Orders ED Discharge Orders     None         Cleave Ternes, Janalyn Me, MD 04/14/24 2100    Arvilla Birmingham, MD 04/14/24 2121

## 2024-04-15 ENCOUNTER — Encounter (HOSPITAL_COMMUNITY): Payer: Self-pay | Admitting: Internal Medicine

## 2024-04-15 DIAGNOSIS — J441 Chronic obstructive pulmonary disease with (acute) exacerbation: Secondary | ICD-10-CM | POA: Diagnosis not present

## 2024-04-15 DIAGNOSIS — J9601 Acute respiratory failure with hypoxia: Secondary | ICD-10-CM | POA: Diagnosis present

## 2024-04-15 DIAGNOSIS — J13 Pneumonia due to Streptococcus pneumoniae: Secondary | ICD-10-CM | POA: Diagnosis not present

## 2024-04-15 DIAGNOSIS — R0602 Shortness of breath: Secondary | ICD-10-CM | POA: Diagnosis present

## 2024-04-15 DIAGNOSIS — A419 Sepsis, unspecified organism: Secondary | ICD-10-CM | POA: Diagnosis present

## 2024-04-15 DIAGNOSIS — D72829 Elevated white blood cell count, unspecified: Secondary | ICD-10-CM | POA: Diagnosis present

## 2024-04-15 LAB — URINALYSIS, COMPLETE (UACMP) WITH MICROSCOPIC
Bilirubin Urine: NEGATIVE
Glucose, UA: NEGATIVE mg/dL
Hgb urine dipstick: NEGATIVE
Ketones, ur: 5 mg/dL — AB
Nitrite: POSITIVE — AB
Protein, ur: NEGATIVE mg/dL
Specific Gravity, Urine: 1.01 (ref 1.005–1.030)
pH: 6 (ref 5.0–8.0)

## 2024-04-15 LAB — BLOOD CULTURE ID PANEL (REFLEXED) - BCID2

## 2024-04-15 LAB — COMPREHENSIVE METABOLIC PANEL WITH GFR
ALT: 15 U/L (ref 0–44)
AST: 15 U/L (ref 15–41)
Albumin: 2.4 g/dL — ABNORMAL LOW (ref 3.5–5.0)
Alkaline Phosphatase: 52 U/L (ref 38–126)
Anion gap: 9 (ref 5–15)
BUN: 11 mg/dL (ref 8–23)
CO2: 22 mmol/L (ref 22–32)
Calcium: 7.9 mg/dL — ABNORMAL LOW (ref 8.9–10.3)
Chloride: 105 mmol/L (ref 98–111)
Creatinine, Ser: 0.67 mg/dL (ref 0.44–1.00)
GFR, Estimated: >60 mL/min (ref >60)
Glucose, Bld: 165 mg/dL — ABNORMAL HIGH (ref 70–99)
Potassium: 4 mmol/L (ref 3.5–5.1)
Sodium: 136 mmol/L (ref 135–145)
Total Bilirubin: 0.8 mg/dL (ref 0.0–1.2)
Total Protein: 4.8 g/dL — ABNORMAL LOW (ref 6.5–8.1)

## 2024-04-15 LAB — STREP PNEUMONIAE URINARY ANTIGEN: Strep Pneumo Urinary Antigen: POSITIVE — AB

## 2024-04-15 LAB — MAGNESIUM: Magnesium: 1.7 mg/dL (ref 1.7–2.4)

## 2024-04-15 LAB — CBC WITH DIFFERENTIAL/PLATELET
Abs Immature Granulocytes: 0.44 10*3/uL — ABNORMAL HIGH (ref 0.00–0.07)
Basophils Absolute: 0.1 10*3/uL (ref 0.0–0.1)
Basophils Relative: 0 %
Eosinophils Absolute: 0 10*3/uL (ref 0.0–0.5)
Eosinophils Relative: 0 %
HCT: 40 % (ref 36.0–46.0)
Hemoglobin: 13.3 g/dL (ref 12.0–15.0)
Immature Granulocytes: 1 %
Lymphocytes Relative: 3 %
Lymphs Abs: 1 10*3/uL (ref 0.7–4.0)
MCH: 31.4 pg (ref 26.0–34.0)
MCHC: 33.3 g/dL (ref 30.0–36.0)
MCV: 94.6 fL (ref 80.0–100.0)
Monocytes Absolute: 0.8 10*3/uL (ref 0.1–1.0)
Monocytes Relative: 3 %
Neutro Abs: 30.7 10*3/uL — ABNORMAL HIGH (ref 1.7–7.7)
Neutrophils Relative %: 93 %
Platelets: 269 10*3/uL (ref 150–400)
RBC: 4.23 MIL/uL (ref 3.87–5.11)
RDW: 13.5 % (ref 11.5–15.5)
WBC: 33 10*3/uL — ABNORMAL HIGH (ref 4.0–10.5)
nRBC: 0 % (ref 0.0–0.2)

## 2024-04-15 LAB — BLOOD GAS, VENOUS
Acid-base deficit: 1.1 mmol/L (ref 0.0–2.0)
Bicarbonate: 23.5 mmol/L (ref 20.0–28.0)
O2 Saturation: 64.1 %
Patient temperature: 37
pCO2, Ven: 38 mmHg — ABNORMAL LOW (ref 44–60)
pH, Ven: 7.4 (ref 7.25–7.43)
pO2, Ven: 38 mmHg (ref 32–45)

## 2024-04-15 LAB — BRAIN NATRIURETIC PEPTIDE: B Natriuretic Peptide: 184.9 pg/mL — ABNORMAL HIGH (ref 0.0–100.0)

## 2024-04-15 LAB — PHOSPHORUS: Phosphorus: 4.1 mg/dL (ref 2.5–4.6)

## 2024-04-15 MED ORDER — BUDESON-GLYCOPYRROL-FORMOTEROL 160-9-4.8 MCG/ACT IN AERO
2.0000 | INHALATION_SPRAY | Freq: Two times a day (BID) | RESPIRATORY_TRACT | Status: DC
  Filled 2024-04-15: qty 5.9

## 2024-04-15 MED ORDER — ATORVASTATIN CALCIUM 10 MG PO TABS
20.0000 mg | ORAL_TABLET | Freq: Every evening | ORAL | Status: DC
  Administered 2024-04-15 – 2024-04-17 (×3): 20 mg via ORAL
  Filled 2024-04-15 (×3): qty 2

## 2024-04-15 MED ORDER — PREDNISONE 20 MG PO TABS
40.0000 mg | ORAL_TABLET | Freq: Every day | ORAL | Status: DC
  Administered 2024-04-16 – 2024-04-18 (×3): 40 mg via ORAL
  Filled 2024-04-15 (×3): qty 2

## 2024-04-15 MED ORDER — BUDESON-GLYCOPYRROL-FORMOTEROL 160-9-4.8 MCG/ACT IN AERO
2.0000 | INHALATION_SPRAY | Freq: Two times a day (BID) | RESPIRATORY_TRACT | Status: DC
  Administered 2024-04-15 – 2024-04-18 (×6): 2 via RESPIRATORY_TRACT
  Filled 2024-04-15: qty 5.9

## 2024-04-15 MED ORDER — METOPROLOL SUCCINATE ER 25 MG PO TB24
12.5000 mg | ORAL_TABLET | Freq: Every day | ORAL | Status: DC
  Administered 2024-04-15 – 2024-04-18 (×4): 12.5 mg via ORAL
  Filled 2024-04-15 (×4): qty 1

## 2024-04-15 MED ORDER — NICOTINE 14 MG/24HR TD PT24: 14.0000 mg | MEDICATED_PATCH | Freq: Every day | TRANSDERMAL | Status: DC | PRN

## 2024-04-15 MED ORDER — ASPIRIN 81 MG PO TBEC
81.0000 mg | DELAYED_RELEASE_TABLET | Freq: Every day | ORAL | Status: DC
  Administered 2024-04-15 – 2024-04-18 (×4): 81 mg via ORAL
  Filled 2024-04-15 (×4): qty 1

## 2024-04-15 MED ORDER — LACTATED RINGERS IV BOLUS
1000.0000 mL | Freq: Once | INTRAVENOUS | Status: AC
  Administered 2024-04-15: 1000 mL via INTRAVENOUS

## 2024-04-15 NOTE — Progress Notes (Signed)
 PHARMACY - PHYSICIAN COMMUNICATION CRITICAL VALUE ALERT - BLOOD CULTURE IDENTIFICATION (BCID)  Candace Middleton is an 63 y.o. female who presented to [facility] on 04/14/2024 with a chief complaint of shortness of breath, cough, fevers despite 1 week course of Augmentin  and Azithromycin .   Assessment:  1 out of 4 bottles GPC - BCID showing staph epidermitis, no resistance. Strep Pneumo urinary antigen is positive. WBC elevated at 33 (on IV SoluMedrol 80 BID). Fever curve has trended down. Oxygen has improved - decreased from 4 to 2L.   Name of physician (or Provider) Contacted: Dr. [place]  Current antibiotics: Ceftriaxone  + Doxycycline   Changes to prescribed antibiotics recommended:  Patient is on recommended antibiotics - No changes needed - Continuing Ceftriaxone  for Strep Pneum UA positive with suspected PNA.   Results for orders placed or performed during the hospital encounter of 04/14/24  Blood Culture ID Panel (Reflexed) (Collected: 04/14/2024  8:39 PM)  Result Value Ref Range   Enterococcus faecalis NOT DETECTED NOT DETECTED   Enterococcus Faecium NOT DETECTED NOT DETECTED   Listeria monocytogenes NOT DETECTED NOT DETECTED   Staphylococcus species DETECTED (A) NOT DETECTED   Staphylococcus aureus (BCID) NOT DETECTED NOT DETECTED   Staphylococcus epidermidis DETECTED (A) NOT DETECTED   Staphylococcus lugdunensis NOT DETECTED NOT DETECTED   Streptococcus species NOT DETECTED NOT DETECTED   Streptococcus agalactiae NOT DETECTED NOT DETECTED   Streptococcus pneumoniae NOT DETECTED NOT DETECTED   Streptococcus pyogenes NOT DETECTED NOT DETECTED   A.calcoaceticus-baumannii NOT DETECTED NOT DETECTED   Bacteroides fragilis NOT DETECTED NOT DETECTED   Enterobacterales NOT DETECTED NOT DETECTED   Enterobacter cloacae complex NOT DETECTED NOT DETECTED   Escherichia coli NOT DETECTED NOT DETECTED   Klebsiella aerogenes NOT DETECTED NOT DETECTED   Klebsiella oxytoca NOT DETECTED NOT  DETECTED   Klebsiella pneumoniae NOT DETECTED NOT DETECTED   Proteus species NOT DETECTED NOT DETECTED   Salmonella species NOT DETECTED NOT DETECTED   Serratia marcescens NOT DETECTED NOT DETECTED   Haemophilus influenzae NOT DETECTED NOT DETECTED   Neisseria meningitidis NOT DETECTED NOT DETECTED   Pseudomonas aeruginosa NOT DETECTED NOT DETECTED   Stenotrophomonas maltophilia NOT DETECTED NOT DETECTED   Candida albicans NOT DETECTED NOT DETECTED   Candida auris NOT DETECTED NOT DETECTED   Candida glabrata NOT DETECTED NOT DETECTED   Candida krusei NOT DETECTED NOT DETECTED   Candida parapsilosis NOT DETECTED NOT DETECTED   Candida tropicalis NOT DETECTED NOT DETECTED   Cryptococcus neoformans/gattii NOT DETECTED NOT DETECTED   Methicillin resistance mecA/C NOT DETECTED NOT DETECTED    Creig Doe 04/15/2024  8:12 PM

## 2024-04-15 NOTE — Progress Notes (Signed)
 PROGRESS NOTE    Candace Middleton  ZOX:[id number] DOB: [birth year] DOA: 04/14/2024 PCP: Abelino Hof, FNP    Brief Narrative:   Candace Middleton is a 63 y.o. female with past medical history significant for COPD in which she is not oxygen dependent, HLD, tobacco use disorder who presented to [facility] ED on 04/14/2024 with 2-week history of progressive shortness of breath, cough, fever/chills.  Recently diagnosed with right lower lobe pneumonia and completed 1 week course of Augmentin  and azithromycin .  Despite compliance with antibiotics, she reported no improvement in her symptoms; thus prompting further evaluation in the ED.  In the ED, temperature 100.3 F, HR 99, RR 26, BP 107/60, SpO2 93% on 4 L nasal cannula.  WBC 29.6, hemoglobin 15.2, platelet count 323.  Sodium 133, potassium 3.9, chloride 100, CO2 22, glucose 96, BUN 9, creatinine 0.73.  Magnesium 1.7.  Procalcitonin 0.15.  COVID/influenza/RSV PCR negative.  Chest x-ray with right lower lobe pneumonia superimposed on a background of COPD.  CT chest without contrast with right lower and middle lobe bronchopneumonia superimposed on advanced COPD, debris's in the right mainstem bronchus with mucous plugging in the lower lobes and right middle lobe query aspiration, acute mildly displaced fracture of the posterior left 11th rib, aortic atherosclerosis.  Blood cultures x 2 obtained.  Patient was administered acetaminophen  650 mg p.o. x 1 dose, Rocephin , doxycycline , normal saline x 1 L bolus.  TRH was consulted for admission for further evaluation management of acute hypoxic respite failure secondary to community-acquired pneumonia, COPD exacerbation.    Assessment & Plan:   Acute hypoxic respiratory failure, POA Patient requiring 4 L per nasal cannula in the ED, likely combination of COPD exacerbation superimposed on community-acquired pneumonia. -- Continue supplemental oxygen, maintain SpO2 greater than 88% --  Treatment as below  Community-acquired pneumonia 2/2 Streptococcus pneumonia Patient presenting to ED with continued shortness of breath, cough with fever/chills despite outpatient antibiotic treatment with Augmentin  and azithromycin  x 10 days.  Patient with temperature 100.3, tachypneic requiring 4 L nasal cannula on arrival.  WBC elevated 29.6.  Procalcitonin 0.15.  CT chest without contrast with right lower and middle lobe bronchopneumonia superimposed on advanced COPD with debris's in the right mainstem.  Strep pneumo urine antigen positive. -- WBC 29.5>33.0 (likely now secondary to steroid effect) -- Doxycycline  100 mg IV every 12 hours -- Ceftriaxone  1 g IV every 24 hours -- Mucinex  6 oh milligrams p.o. twice daily -- Incentive spirometry/flutter valve -- Continue supplemental oxygen, maintain SPO2 greater than 88% -- Home O2 screening tomorrow  COPD exacerbation -- Breztri  2 puffs BID -- Doxycycline  as above -- Albuterol  neb every 4 hours.  Wheezing/shortness of breath -- Transition IV Solu-Medrol  to prednisone  tomorrow -- Continue supplemental oxygen as above, attempt to wean off as she is not oxygen dependent at baseline  Essential hypertension -- Holding home amlodipine  for now -- Continue to monitor BP closely  Hyperlipidemia -- Atorvastatin  20 mg p.o. daily  DVT prophylaxis: SCDs Start: 04/14/24 2136    Code Status: Full Code Family Communication: No family present at bedside this morning  Disposition Plan:  Level of care: Progressive Status is: Inpatient Remains inpatient appropriate because: I have antibiotics, needs further weaning from oxygen, anticipate discharge home in 1-2 days    Consultants:  None  Procedures:  None  Antimicrobials:  Doxycycline  4/21>> Ceftriaxone  4/21>>   Subjective: Seen examined bedside, lying in bed.  Remains in ED holding area.  Reports dyspnea much improved,  oxygen now titrated down to 3 L.  Remains now afebrile.  Continues  on IV Novox.  Discussed transitioning IV steroids to oral prednisone  tomorrow.  Discussed hopeful she may be able discharge home in 1-2 days as long as she remains afebrile with improvement of symptoms.  No other specific questions, concerns or complaints at this time.  Denies headache, no visual changes, no chest pain, no palpitations, no abdominal pain, no current fever, no current chills, no focal weakness, no fatigue, no paresthesias.  No acute events overnight per nurse staff.  Objective: Vitals:   04/15/24 0730 04/15/24 0900 04/15/24 1000 04/15/24 1030  BP: 100/61 93/61 99/63  107/68  Pulse: 66 76 73 72  Resp: 16 20 (!) 24 20  Temp:  98.7 F (37.1 C)    TempSrc:      SpO2: 99% 97% 99% 98%  Weight:      Height:        Intake/Output Summary (Last 24 hours) at 04/15/2024 1218 Last data filed at 04/15/2024 0651 Gross per 24 hour  Intake 3409.07 ml  Output --  Net 3409.07 ml   Filed Weights   04/14/24 1900  Weight: 34.9 kg    Examination:  Physical Exam: GEN: NAD, alert and oriented x 3, thin in appearance HEENT: NCAT, PERRL, EOMI, sclera clear, MMM PULM: Breath sounds slight diminished bilateral bases, no wheezes/crackles, normal respiratory effort without accessory muscle use, on 3 L nasal cannula CV: RRR w/o M/G/R GI: abd soft, NTND, NABS, no R/G/M MSK: no peripheral edema, muscle strength globally intact 5/5 bilateral upper/lower extremities NEURO: CN II-XII intact, no focal deficits, sensation to light touch intact PSYCH: normal mood/affect Integumentary: No concerning rashes/lesion/wounds noted on exposed skin surfaces    Data Reviewed: I have personally reviewed following labs and imaging studies  CBC: Recent Labs  Lab 04/14/24 1904 04/15/24 0536  WBC 29.6* 33.0*  NEUTROABS  --  30.7*  HGB 15.2* 13.3  HCT 44.9 40.0  MCV 93.2 94.6  PLT 323 269   Basic Metabolic Panel: Recent Labs  Lab 04/14/24 1904 04/15/24 0536  NA 133* 136  K 3.9 4.0  CL 100 105   CO2 22 22  GLUCOSE 96 165*  BUN 9 11  CREATININE 0.73 0.67  CALCIUM  8.7* 7.9*  MG 1.7 1.7  PHOS  --  4.1   GFR: Estimated Creatinine Clearance: 38.6 mL/min (by C-G formula based on SCr of 0.67 mg/dL). Liver Function Tests: Recent Labs  Lab 04/15/24 0536  AST 15  ALT 15  ALKPHOS 52  BILITOT 0.8  PROT 4.8*  ALBUMIN 2.4*   No results for input(s): "LIPASE", "AMYLASE" in the last 168 hours. No results for input(s): "AMMONIA" in the last 168 hours. Coagulation Profile: No results for input(s): "INR", "PROTIME" in the last 168 hours. Cardiac Enzymes: No results for input(s): "CKTOTAL", "CKMB", "CKMBINDEX", "TROPONINI" in the last 168 hours. BNP (last 3 results) No results for input(s): "PROBNP" in the last 8760 hours. HbA1C: No results for input(s): "HGBA1C" in the last 72 hours. CBG: No results for input(s): "GLUCAP" in the last 168 hours. Lipid Profile: No results for input(s): "CHOL", "HDL", "LDLCALC", "TRIG", "CHOLHDL", "LDLDIRECT" in the last 72 hours. Thyroid  Function Tests: No results for input(s): "TSH", "T4TOTAL", "FREET4", "T3FREE", "THYROIDAB" in the last 72 hours. Anemia Panel: No results for input(s): "VITAMINB12", "FOLATE", "FERRITIN", "TIBC", "IRON", "RETICCTPCT" in the last 72 hours. Sepsis Labs: Recent Labs  Lab 04/14/24 1904 04/14/24 2118  PROCALCITON 0.15  --  LATICACIDVEN  --  1.0    Recent Results (from the past 240 hours)  Resp panel by RT-PCR (RSV, Flu A&B, Covid) Anterior Nasal Swab     Status: None   Collection Time: 04/14/24  7:28 PM   Specimen: Anterior Nasal Swab  Result Value Ref Range Status   SARS Coronavirus 2 by RT PCR NEGATIVE NEGATIVE Final   Influenza A by PCR NEGATIVE NEGATIVE Final   Influenza B by PCR NEGATIVE NEGATIVE Final    Comment: (NOTE) The Xpert Xpress SARS-CoV-2/FLU/RSV plus assay is intended as an aid in the diagnosis of influenza from Nasopharyngeal swab specimens and should not be used as a sole basis for  treatment. Nasal washings and aspirates are unacceptable for Xpert Xpress SARS-CoV-2/FLU/RSV testing.  Fact Sheet for Patients: BloggerCourse.com  Fact Sheet for Healthcare Providers: SeriousBroker.it  This test is not yet approved or cleared by the [place]  FDA and has been authorized for detection and/or diagnosis of SARS-CoV-2 by FDA under an Emergency Use Authorization (EUA). This EUA will remain in effect (meaning this test can be used) for the duration of the COVID-19 declaration under Section 564(b)(1) of the Act, 21 U.S.C. section 360bbb-3(b)(1), unless the authorization is terminated or revoked.     Resp Syncytial Virus by PCR NEGATIVE NEGATIVE Final    Comment: (NOTE) Fact Sheet for Patients: BloggerCourse.com  Fact Sheet for Healthcare Providers: SeriousBroker.it  This test is not yet approved or cleared by the [place]  FDA and has been authorized for detection and/or diagnosis of SARS-CoV-2 by FDA under an Emergency Use Authorization (EUA). This EUA will remain in effect (meaning this test can be used) for the duration of the COVID-19 declaration under Section 564(b)(1) of the Act, 21 U.S.C. section 360bbb-3(b)(1), unless the authorization is terminated or revoked.  Performed at [facility], [address] [place], [place] [address]   Blood culture (routine x 2)     Status: None (Preliminary result)   Collection Time: 04/14/24  8:39 PM   Specimen: BLOOD  Result Value Ref Range Status   Specimen Description BLOOD RIGHT ANTECUBITAL  Final   Special Requests   Final    BOTTLES DRAWN AEROBIC AND ANAEROBIC Blood Culture adequate volume   Culture   Final    NO GROWTH < 12 HOURS Performed at [facility], [address], [place], [place] [address]    Report Status PENDING  Incomplete  Blood culture (routine x 2)     Status: None (Preliminary  result)   Collection Time: 04/14/24  8:44 PM   Specimen: BLOOD  Result Value Ref Range Status   Specimen Description BLOOD LEFT ANTECUBITAL  Final   Special Requests   Final    BOTTLES DRAWN AEROBIC AND ANAEROBIC Blood Culture adequate volume   Culture   Final    NO GROWTH < 12 HOURS Performed at [facility], [address] [place], [place] [address]    Report Status PENDING  Incomplete         Radiology Studies: CT Chest Wo Contrast Result Date: 04/14/2024 CLINICAL DATA:  Pneumonia complication suspected. History of COPD. Shortness of breath and hypoxia. EXAM: CT CHEST WITHOUT CONTRAST TECHNIQUE: Multidetector CT imaging of the chest was performed following the standard protocol without IV contrast. RADIATION DOSE REDUCTION: This exam was performed according to the departmental dose-optimization program which includes automated exposure control, adjustment of the mA and/or kV according to patient size and/or use of iterative reconstruction technique.  COMPARISON:  Same day chest radiograph and CT chest 06/26/2022 FINDINGS: Cardiovascular: Normal heart size. No pericardial effusion. Mild aortic atherosclerotic calcification. Mediastinum/Nodes: Trachea is patent. Debris is present in the right mainstem bronchus. Unremarkable esophagus. No thoracic adenopathy noting limited sensitivity of noncontrast exam. Lungs/Pleura: Advanced emphysema. Diffuse bronchial wall thickening greatest in the lower lobes with scattered mucous plugs. Interlobular septal thickening and patchy airspace opacities in the right middle lobe and right lower lobe. No pleural effusion or pneumothorax. Upper Abdomen: No acute abnormality. Musculoskeletal: Acute mildly displaced fracture of the posterior left eleventh rib IMPRESSION: 1. Right lower and middle lobe bronchopneumonia superimposed on advanced COPD. 2. Debris in the right mainstem bronchus with mucous plugging in the lower lobes and right middle lobe. Query  aspiration. 3. Acute mildly displaced fracture of the posterior left eleventh rib. Aortic Atherosclerosis (ICD10-I70.0) and Emphysema (ICD10-J43.9). Electronically Signed   By: Rozell Cornet M.D.   On: 04/14/2024 23:04   DG Chest Portable 1 View Result Date: 04/14/2024 CLINICAL DATA:  Shortness of breath. Evaluation for left-sided pneumothorax. EXAM: PORTABLE CHEST 1 VIEW COMPARISON:  04/02/2024 FINDINGS: Hyperinflation and chronic bronchitic change. Airspace interstitial opacities in the right lower lung. No pleural effusion or pneumothorax. Stable cardiomediastinal silhouette. IMPRESSION: 1. Right lower lung pneumonia superimposed on a background of COPD. Electronically Signed   By: Rozell Cornet M.D.   On: 04/14/2024 22:58        Scheduled Meds:  aspirin  EC  81 mg Oral Daily   atorvastatin   20 mg Oral QPM   budeson-glycopyrrolate -formoterol   2 puff Inhalation BID   guaiFENesin   600 mg Oral BID   metoprolol  succinate  12.5 mg Oral Daily   [START ON 04/16/2024] predniSONE   40 mg Oral Q breakfast   Continuous Infusions:  cefTRIAXone  (ROCEPHIN )  IV     doxycycline  (VIBRAMYCIN ) IV Stopped (04/15/24 1050)     LOS: 1 day    Time spent: 51 minutes spent on 04/15/2024 caring for this patient face-to-face including chart review, ordering labs/tests, documenting, discussion with nursing staff, consultants, updating family and interview/physical exam    Rema [facility] [place], DO [facility] Available via Epic secure chat 7am-7pm After these hours, please refer to coverage provider listed on [email or web address] 04/15/2024, 12:18 PM

## 2024-04-16 DIAGNOSIS — J189 Pneumonia, unspecified organism: Secondary | ICD-10-CM | POA: Diagnosis not present

## 2024-04-16 DIAGNOSIS — R7881 Bacteremia: Secondary | ICD-10-CM

## 2024-04-16 DIAGNOSIS — R0602 Shortness of breath: Secondary | ICD-10-CM

## 2024-04-16 DIAGNOSIS — D72823 Leukemoid reaction: Secondary | ICD-10-CM | POA: Diagnosis not present

## 2024-04-16 DIAGNOSIS — B957 Other staphylococcus as the cause of diseases classified elsewhere: Secondary | ICD-10-CM

## 2024-04-16 LAB — C-REACTIVE PROTEIN: CRP: 6.3 mg/dL — ABNORMAL HIGH (ref <1.0)

## 2024-04-16 LAB — PROCALCITONIN: Procalcitonin: 0.46 ng/mL

## 2024-04-16 LAB — BRAIN NATRIURETIC PEPTIDE: B Natriuretic Peptide: 219.6 pg/mL — ABNORMAL HIGH (ref 0.0–100.0)

## 2024-04-16 LAB — GLUCOSE, CAPILLARY: Glucose-Capillary: 129 mg/dL — ABNORMAL HIGH (ref 70–99)

## 2024-04-16 LAB — MRSA NEXT GEN BY PCR, NASAL: MRSA by PCR Next Gen: NOT DETECTED

## 2024-04-16 MED ORDER — SODIUM CHLORIDE 0.9 % IV SOLN
2.0000 g | INTRAVENOUS | Status: DC
  Administered 2024-04-16 – 2024-04-17 (×2): 2 g via INTRAVENOUS
  Filled 2024-04-16 (×2): qty 20

## 2024-04-16 MED ORDER — ENOXAPARIN SODIUM 30 MG/0.3ML IJ SOSY
30.0000 mg | PREFILLED_SYRINGE | INTRAMUSCULAR | Status: DC
  Administered 2024-04-16 – 2024-04-17 (×2): 30 mg via SUBCUTANEOUS
  Filled 2024-04-16 (×2): qty 0.3

## 2024-04-16 NOTE — Evaluation (Signed)
 Clinical/Bedside Swallow Evaluation Patient Details  Name: Candace Middleton MRN: [id number] Date of Birth: Sep 20, 1961  Today's Date: 04/16/2024 Time: SLP Start Time (ACUTE ONLY): 1133 SLP Stop Time (ACUTE ONLY): 1145 SLP Time Calculation (min) (ACUTE ONLY): 12 min  Past Medical History:  Past Medical History:  Diagnosis Date   Allergy    Anxiety    Arthritis    Cancer (HCC)    bladder ca   COPD (chronic obstructive pulmonary disease) (HCC)    Coronary artery disease    Depression    GERD (gastroesophageal reflux disease)    History of kidney stones    Hyperlipidemia    Neuromuscular disorder (HCC) 2014   RSD - nerve blocks in back for treatment    Osteoporosis 09/05/2019   Past Surgical History:  Past Surgical History:  Procedure Laterality Date   bladder cancer  2006   BLADDER TUMOR EXCISION  12/25/2004   BREAST SURGERY  "long time ago"   lumpectomy    CARDIAC CATHETERIZATION N/A 09/07/2015   Procedure: Left Heart Cath and Coronary Angiography;  Surgeon: Lucendia Rusk, MD;  Location: [facility] INVASIVE CV LAB;  Service: Cardiovascular;  Laterality: N/A;   CESAREAN SECTION  1982   CYSTOSCOPY WITH BIOPSY N/A 09/26/2023   Procedure: CYSTOSCOPY WITH BLADDER  BIOPSY;  Surgeon: Melody Spurling., MD;  Location: [facility] ORS;  Service: Urology;  Laterality: N/A;  45 MINS FOR CASE   CYSTOSCOPY/URETEROSCOPY/HOLMIUM LASER/STENT PLACEMENT Left 09/26/2023   Procedure: CYSTOSCOPY LEFT RETROGRADE PYELOGRAM/URETEROSCOPY/BASKETING OF STONE;  Surgeon: Melody Spurling., MD;  Location: [facility];  Service: Urology;  Laterality: Left;   ENDOMETRIAL ABLATION  12/25/1993   HPI:  Candace Middleton is a 63 yo female presenting ot ED 4/21 with SOB. Workup revealed severe sepsis due to PNA. CT Chest shows R lower and middle lobe bronchopneumonia superimposed on advanced COPD and debris in the R mainstem bronchus with mucous plugging in the lower lobes and R middle lobe. PMH includes tobacco use,  COPD, CAD, HLD    Assessment / Plan / Recommendation  Clinical Impression  Pt reports infrequent globus sensation and regurgitation, but no overt difficulty swallowing. She fed herself trials of thin liquids, purees, and solids without signs clinically concerning for dysphagia or aspiration. Discussed proceeding with an MBS given CT Chest findings, with which pt is agreeable. Recommend she continue her current diet in the interim. SLP Visit Diagnosis: Dysphagia, unspecified (R13.10)    Aspiration Risk  Moderate aspiration risk    Diet Recommendation Regular;Thin liquid    Liquid Administration via: Cup;Straw Medication Administration: Whole meds with liquid Supervision: Patient able to self feed Compensations: Minimize environmental distractions;Slow rate;Small sips/bites Postural Changes: Seated upright at 90 degrees;Remain upright for at least 30 minutes after po intake    Other  Recommendations Oral Care Recommendations: Oral care BID    Recommendations for follow up therapy are one component of a multi-disciplinary discharge planning process, led by the attending physician.  Recommendations may be updated based on patient status, additional functional criteria and insurance authorization.  Follow up Recommendations Other (comment) (TBA)      Assistance Recommended at Discharge    Functional Status Assessment Patient has had a recent decline in their functional status and demonstrates the ability to make significant improvements in function in a reasonable and predictable amount of time.  Frequency and Duration min 2x/week  1 week       Prognosis Prognosis for improved oropharyngeal function: Good  Swallow Study   General HPI: Candace Middleton is a 64 yo female presenting ot ED 4/21 with SOB. Workup revealed severe sepsis due to PNA. CT Chest shows R lower and middle lobe bronchopneumonia superimposed on advanced COPD and debris in the R mainstem bronchus with mucous  plugging in the lower lobes and R middle lobe. PMH includes tobacco use, COPD, CAD, HLD Type of Study: Bedside Swallow Evaluation Previous Swallow Assessment: none in chart Diet Prior to this Study: Regular;Thin liquids (Level 0) Temperature Spikes Noted: No Respiratory Status: Nasal cannula History of Recent Intubation: No Behavior/Cognition: Alert;Cooperative;Pleasant mood Oral Cavity Assessment: Within Functional Limits Oral Care Completed by SLP: No Oral Cavity - Dentition: Adequate natural dentition Vision: Functional for self-feeding Self-Feeding Abilities: Able to feed self Patient Positioning: Upright in chair Baseline Vocal Quality: Normal Volitional Cough: Congested Volitional Swallow: Able to elicit    Oral/Motor/Sensory Function Overall Oral Motor/Sensory Function: Within functional limits   Ice Chips Ice chips: Not tested   Thin Liquid Thin Liquid: Within functional limits Presentation: Straw;Self Fed    Nectar Thick Nectar Thick Liquid: Not tested   Honey Thick Honey Thick Liquid: Not tested   Puree Puree: Within functional limits Presentation: Spoon;Self Fed   Solid     Solid: Within functional limits Presentation: Self Fed      Amil Kale, M.A., CCC-SLP Speech Language Pathology, Acute Rehabilitation Services  Secure Chat preferred [phone]  04/16/2024,12:15 PM

## 2024-04-16 NOTE — Consult Note (Addendum)
 [facility]  Total days of antibiotics 3  Reason for Consult:CoNSstaph species bacteremia   Referring Physician: singh  Principal Problem:   CAP (community acquired pneumonia) Active Problems:   HLD (hyperlipidemia)   Tobacco abuse   COPD with acute exacerbation (HCC)   SOB (shortness of breath)   Severe sepsis (HCC)   Leukocytosis   Acute hypoxic respiratory failure (HCC)    HPI: Candace Middleton is a 63 y.o. female with hx of COPD who has been feeling poorly for the past 2 weeks with worsening cough, and shortness of breath. Roughly 4 days prior to admission started to have fever, chills, rigors, with temp up to 101F. She was diagnosed with CAP to [facility] as outpatient and given a course of augmentin  and azithromcyin earlier in her course but did not improve significantly. She was found to have leukocytosis 33K, hypoxia requiring 2 L Goodman, started on IV ceftriaxone  and azithromycin . Infectious work up shows ur strep pneumo antigen positive, but 2 of 4 bottles positive for GPC. One is identified as staph epi. ID asked to weigh in to see if she has true bacteremia. The patient is feeling slightly better. No fevers.   Past Medical History:  Diagnosis Date   Allergy    Anxiety    Arthritis    Cancer (HCC)    bladder ca   COPD (chronic obstructive pulmonary disease) (HCC)    Coronary artery disease    Depression    GERD (gastroesophageal reflux disease)    History of kidney stones    Hyperlipidemia    Neuromuscular disorder (HCC) 2014   RSD - nerve blocks in back for treatment    Osteoporosis 09/05/2019    Allergies:  Allergies  Allergen Reactions   Gabapentin  Hives    Patient reports facial swelling, hives, facial involvement    Sulfa Antibiotics Anaphylaxis    Onset at age 78, "almost died because I stopped breathing."    Current antibiotics:   MEDICATIONS:  aspirin  EC  81 mg Oral Daily   atorvastatin   20 mg Oral QPM    budeson-glycopyrrolate -formoterol   2 puff Inhalation BID   enoxaparin  (LOVENOX ) injection  30 mg Subcutaneous Q24H   guaiFENesin   600 mg Oral BID   metoprolol  succinate  12.5 mg Oral Daily   predniSONE   40 mg Oral Q breakfast    Social History   Tobacco Use   Smoking status: Every Day    Current packs/day: 0.50    Average packs/day: 0.5 packs/day for 30.0 years (15.0 ttl pk-yrs)    Types: Cigarettes   Smokeless tobacco: Never  Vaping Use   Vaping status: Never Used  Substance Use Topics   Alcohol  use: No   Drug use: No    Family History  Problem Relation Age of Onset   Heart disease Mother    Thyroid  disease Sister    Melanoma Sister    Diabetes Brother    Epilepsy Son    Early death Father    Pulmonary embolism Father    Thyroid  disease Brother    Colon cancer Neg Hx     Review of Systems -   Constitutional: positive for fever, chills, diaphoresis, activity change, appetite change, fatigue and unexpected weight change.  HENT: Negative for congestion, sore throat, rhinorrhea, sneezing, trouble swallowing and sinus pressure.  Eyes: Negative for photophobia and visual disturbance.  Respiratory: positive for cough, chest tightness, shortness of breath, wheezing and stridor.  Cardiovascular: Negative for chest  pain, palpitations and leg swelling.  Gastrointestinal: Negative for nausea, vomiting, abdominal pain, diarrhea, constipation, blood in stool, abdominal distention and anal bleeding.  Genitourinary: Negative for dysuria, hematuria, flank pain and difficulty urinating.  Musculoskeletal: Negative for myalgias, back pain, joint swelling, arthralgias and gait problem.  Skin: Negative for color change, pallor, rash and wound.  Neurological: Negative for dizziness, tremors, weakness and light-headedness.  Hematological: Negative for adenopathy. Does not bruise/bleed easily.  Psychiatric/Behavioral: Negative for behavioral problems, confusion, sleep disturbance, dysphoric  mood, decreased concentration and agitation.     OBJECTIVE: Temp:  [97.5 F (36.4 C)-98.5 F (36.9 C)] 98.5 F (36.9 C) (04/23 1530) Pulse Rate:  [59-88] 65 (04/23 1530) Resp:  [18] 18 (04/23 0011) BP: (83-114)/(48-66) 106/57 (04/23 1530) SpO2:  [95 %-97 %] 95 % (04/23 1530) Weight:  [36.4 kg] 36.4 kg (04/23 0500) Physical Exam  Constitutional:  oriented to person, place, and time. appears well-developed and well-nourished. No distress.  HENT: [place]/AT, PERRLA, no scleral icterus Mouth/Throat: Oropharynx is clear and moist. No oropharyngeal exudate.  Cardiovascular: Normal rate, regular rhythm and normal heart sounds. Exam reveals no gallop and no friction rub.  No murmur heard.  Pulmonary/Chest: Effort normal and breath sounds normal. No respiratory distress.  has no wheezes. Decrease breath sounds at right base Neck = supple, no nuchal rigidity Abdominal: Soft. Bowel sounds are normal.  exhibits no distension. There is no tenderness.  Lymphadenopathy: no cervical adenopathy. No axillary adenopathy Neurological: alert and oriented to person, place, and time.  Skin: Skin is warm and dry. No rash noted. No erythema.  Psychiatric: a normal mood and affect.  behavior is normal.    LABS: Results for orders placed or performed during the hospital encounter of 04/14/24 (from the past 48 hours)  Basic metabolic panel     Status: Abnormal   Collection Time: 04/14/24  7:04 PM  Result Value Ref Range   Sodium 133 (L) 135 - 145 mmol/L   Potassium 3.9 3.5 - 5.1 mmol/L   Chloride 100 98 - 111 mmol/L   CO2 22 22 - 32 mmol/L   Glucose, Bld 96 70 - 99 mg/dL    Comment: Glucose reference range applies only to samples taken after fasting for at least 8 hours.   BUN 9 8 - 23 mg/dL   Creatinine, Ser 6.23 0.44 - 1.00 mg/dL   Calcium  8.7 (L) 8.9 - 10.3 mg/dL   GFR, Estimated >76 >28 mL/min    Comment: (NOTE) Calculated using the CKD-EPI Creatinine Equation (2021)    Anion gap 11 5 - 15     Comment: Performed at [facility], [address], [place], [place] [address]  CBC     Status: Abnormal   Collection Time: 04/14/24  7:04 PM  Result Value Ref Range   WBC 29.6 (H) 4.0 - 10.5 K/uL   RBC 4.82 3.87 - 5.11 MIL/uL   Hemoglobin 15.2 (H) 12.0 - 15.0 g/dL   HCT 61.6 07.3 - 71.0 %   MCV 93.2 80.0 - 100.0 fL   MCH 31.5 26.0 - 34.0 pg   MCHC 33.9 30.0 - 36.0 g/dL   RDW 62.6 94.8 - 54.6 %   Platelets 323 150 - 400 K/uL   nRBC 0.0 0.0 - 0.2 %    Comment: Performed at [facility], [address] [place], [place] [address]  Magnesium     Status: None   Collection Time: 04/14/24  7:04 PM  Result Value Ref Range  Magnesium 1.7 1.7 - 2.4 mg/dL    Comment: Performed at [facility], [address] [place], [place] [address]  Procalcitonin     Status: None   Collection Time: 04/14/24  7:04 PM  Result Value Ref Range   Procalcitonin 0.15 ng/mL    Comment:        Interpretation: PCT (Procalcitonin) <= 0.5 ng/mL: Systemic infection (sepsis) is not likely. Local bacterial infection is possible. (NOTE)       Sepsis PCT Algorithm           Lower Respiratory Tract                                      Infection PCT Algorithm    ----------------------------     ----------------------------         PCT < 0.25 ng/mL                PCT < 0.10 ng/mL          Strongly encourage             Strongly discourage   discontinuation of antibiotics    initiation of antibiotics    ----------------------------     -----------------------------       PCT 0.25 - 0.50 ng/mL            PCT 0.10 - 0.25 ng/mL               OR       >80% decrease in PCT            Discourage initiation of                                            antibiotics      Encourage discontinuation           of antibiotics    ----------------------------     -----------------------------         PCT >= 0.50 ng/mL              PCT 0.26 - 0.50 ng/mL               AND        <80% decrease in PCT              Encourage initiation of                                             antibiotics       Encourage continuation           of antibiotics    ----------------------------     -----------------------------        PCT >= 0.50 ng/mL                  PCT > 0.50 ng/mL               AND         increase in PCT                  Strongly encourage  initiation of antibiotics    Strongly encourage escalation           of antibiotics                                     -----------------------------                                           PCT <= 0.25 ng/mL                                                 OR                                        > 80% decrease in PCT                                      Discontinue / Do not initiate                                             antibiotics  Performed at [facility], [address] [place], [place] [address]   Resp panel by RT-PCR (RSV, Flu A&B, Covid) Anterior Nasal Swab     Status: None   Collection Time: 04/14/24  7:28 PM   Specimen: Anterior Nasal Swab  Result Value Ref Range   SARS Coronavirus 2 by RT PCR NEGATIVE NEGATIVE   Influenza A by PCR NEGATIVE NEGATIVE   Influenza B by PCR NEGATIVE NEGATIVE    Comment: (NOTE) The Xpert Xpress SARS-CoV-2/FLU/RSV plus assay is intended as an aid in the diagnosis of influenza from Nasopharyngeal swab specimens and should not be used as a sole basis for treatment. Nasal washings and aspirates are unacceptable for Xpert Xpress SARS-CoV-2/FLU/RSV testing.  Fact Sheet for Patients: BloggerCourse.com  Fact Sheet for Healthcare Providers: SeriousBroker.it  This test is not yet approved or cleared by the [place]  FDA and has been authorized for detection and/or diagnosis of SARS-CoV-2 by FDA under an Emergency Use Authorization (EUA). This EUA will remain in effect (meaning this test can be used) for the  duration of the COVID-19 declaration under Section 564(b)(1) of the Act, 21 U.S.C. section 360bbb-3(b)(1), unless the authorization is terminated or revoked.     Resp Syncytial Virus by PCR NEGATIVE NEGATIVE    Comment: (NOTE) Fact Sheet for Patients: BloggerCourse.com  Fact Sheet for Healthcare Providers: SeriousBroker.it  This test is not yet approved or cleared by the [place]  FDA and has been authorized for detection and/or diagnosis of SARS-CoV-2 by FDA under an Emergency Use Authorization (EUA). This EUA will remain in effect (meaning this test can be used) for the duration of the COVID-19 declaration under Section 564(b)(1) of the Act, 21 U.S.C. section 360bbb-3(b)(1), unless the authorization is terminated or revoked.  Performed at [facility], [address] [place], [place]  [address]   Blood culture (routine x 2)     Status: None (Preliminary result)   Collection Time: 04/14/24  8:39 PM   Specimen: BLOOD  Result Value Ref Range   Specimen Description BLOOD RIGHT ANTECUBITAL    Special Requests      BOTTLES DRAWN AEROBIC AND ANAEROBIC Blood Culture adequate volume   Culture  Setup Time      GRAM POSITIVE COCCI IN CLUSTERS ANAEROBIC BOTTLE ONLY CRITICAL RESULT CALLED TO, READ BACK BY AND VERIFIED WITH: PHARMD JESSICA MILLEN ON 04/15/24 @ 2010 BY DRT Performed at [facility], [address], [place], [place] [address]    Culture GRAM POSITIVE COCCI    Report Status PENDING   Blood Culture ID Panel (Reflexed)     Status: Abnormal   Collection Time: 04/14/24  8:39 PM  Result Value Ref Range   Enterococcus faecalis NOT DETECTED NOT DETECTED   Enterococcus Faecium NOT DETECTED NOT DETECTED   Listeria monocytogenes NOT DETECTED NOT DETECTED   Staphylococcus species DETECTED (A) NOT DETECTED    Comment: CRITICAL RESULT CALLED TO, READ BACK BY AND VERIFIED WITH: PHARMD JESSICA MILLEN ON 04/15/24 @ 2010  BY DRT    Staphylococcus aureus (BCID) NOT DETECTED NOT DETECTED   Staphylococcus epidermidis DETECTED (A) NOT DETECTED    Comment: CRITICAL RESULT CALLED TO, READ BACK BY AND VERIFIED WITH: PHARMD JESSICA MILLEN ON 04/15/24 @ 2010 BY DRT    Staphylococcus lugdunensis NOT DETECTED NOT DETECTED   Streptococcus species NOT DETECTED NOT DETECTED   Streptococcus agalactiae NOT DETECTED NOT DETECTED   Streptococcus pneumoniae NOT DETECTED NOT DETECTED   Streptococcus pyogenes NOT DETECTED NOT DETECTED   A.calcoaceticus-baumannii NOT DETECTED NOT DETECTED   Bacteroides fragilis NOT DETECTED NOT DETECTED   Enterobacterales NOT DETECTED NOT DETECTED   Enterobacter cloacae complex NOT DETECTED NOT DETECTED   Escherichia coli NOT DETECTED NOT DETECTED   Klebsiella aerogenes NOT DETECTED NOT DETECTED   Klebsiella oxytoca NOT DETECTED NOT DETECTED   Klebsiella pneumoniae NOT DETECTED NOT DETECTED   Proteus species NOT DETECTED NOT DETECTED   Salmonella species NOT DETECTED NOT DETECTED   Serratia marcescens NOT DETECTED NOT DETECTED   Haemophilus influenzae NOT DETECTED NOT DETECTED   Neisseria meningitidis NOT DETECTED NOT DETECTED   Pseudomonas aeruginosa NOT DETECTED NOT DETECTED   Stenotrophomonas maltophilia NOT DETECTED NOT DETECTED   Candida albicans NOT DETECTED NOT DETECTED   Candida auris NOT DETECTED NOT DETECTED   Candida glabrata NOT DETECTED NOT DETECTED   Candida krusei NOT DETECTED NOT DETECTED   Candida parapsilosis NOT DETECTED NOT DETECTED   Candida tropicalis NOT DETECTED NOT DETECTED   Cryptococcus neoformans/gattii NOT DETECTED NOT DETECTED   Methicillin resistance mecA/C NOT DETECTED NOT DETECTED    Comment: Performed at [facility], [address], [place], [place] [address]  Blood culture (routine x 2)     Status: None (Preliminary result)   Collection Time: 04/14/24  8:44 PM   Specimen: BLOOD  Result Value Ref Range   Specimen Description BLOOD LEFT  ANTECUBITAL    Special Requests      BOTTLES DRAWN AEROBIC AND ANAEROBIC Blood Culture adequate volume   Culture  Setup Time      GRAM POSITIVE COCCI IN CLUSTERS BOTTLES DRAWN AEROBIC ONLY CRITICAL VALUE NOTED.  VALUE IS CONSISTENT WITH PREVIOUSLY REPORTED AND CALLED VALUE. Performed at [facility], [address], [place], [place] [address]    Culture GRAM POSITIVE COCCI  Report Status PENDING   I-Stat CG4 Lactic Acid     Status: None   Collection Time: 04/14/24  9:18 PM  Result Value Ref Range   Lactic Acid, Venous 1.0 0.5 - 1.9 mmol/L  Urinalysis, Complete w Microscopic -Urine, Clean Catch     Status: Abnormal   Collection Time: 04/15/24  3:48 AM  Result Value Ref Range   Color, Urine YELLOW YELLOW   APPearance HAZY (A) CLEAR   Specific Gravity, Urine 1.010 1.005 - 1.030   pH 6.0 5.0 - 8.0   Glucose, UA NEGATIVE NEGATIVE mg/dL   Hgb urine dipstick NEGATIVE NEGATIVE   Bilirubin Urine NEGATIVE NEGATIVE   Ketones, ur 5 (A) NEGATIVE mg/dL   Protein, ur NEGATIVE NEGATIVE mg/dL   Nitrite POSITIVE (A) NEGATIVE   Leukocytes,Ua MODERATE (A) NEGATIVE   RBC / HPF 0-5 0 - 5 RBC/hpf   WBC, UA 0-5 0 - 5 WBC/hpf   Bacteria, UA FEW (A) NONE SEEN   Squamous Epithelial / HPF 0-5 0 - 5 /HPF   Mucus PRESENT    Non Squamous Epithelial 0-5 (A) NONE SEEN    Comment: Performed at [facility], [address], [place], [place] [address]  Strep pneumoniae urinary antigen     Status: Abnormal   Collection Time: 04/15/24  3:48 AM  Result Value Ref Range   Strep Pneumo Urinary Antigen POSITIVE (A) NEGATIVE    Comment: RESULTS CALLED TO READ BACK BY AND VERIFIED WITH RN S.SIMS ON 04/15/24 AT 0514 BY NM Performed at [facility], [address] [place], [place] [address]   CBC with Differential/Platelet     Status: Abnormal   Collection Time: 04/15/24  5:36 AM  Result Value Ref Range   WBC 33.0 (H) 4.0 - 10.5 K/uL   RBC 4.23 3.87 - 5.11 MIL/uL   Hemoglobin 13.3 12.0 - 15.0  g/dL   HCT 09.8 11.9 - 14.7 %   MCV 94.6 80.0 - 100.0 fL   MCH 31.4 26.0 - 34.0 pg   MCHC 33.3 30.0 - 36.0 g/dL   RDW 82.9 56.2 - 13.0 %   Platelets 269 150 - 400 K/uL   nRBC 0.0 0.0 - 0.2 %   Neutrophils Relative % 93 %   Neutro Abs 30.7 (H) 1.7 - 7.7 K/uL   Lymphocytes Relative 3 %   Lymphs Abs 1.0 0.7 - 4.0 K/uL   Monocytes Relative 3 %   Monocytes Absolute 0.8 0.1 - 1.0 K/uL   Eosinophils Relative 0 %   Eosinophils Absolute 0.0 0.0 - 0.5 K/uL   Basophils Relative 0 %   Basophils Absolute 0.1 0.0 - 0.1 K/uL   Immature Granulocytes 1 %   Abs Immature Granulocytes 0.44 (H) 0.00 - 0.07 K/uL    Comment: Performed at [facility], [address], [place], [place] [address]  Comprehensive metabolic panel with GFR     Status: Abnormal   Collection Time: 04/15/24  5:36 AM  Result Value Ref Range   Sodium 136 135 - 145 mmol/L   Potassium 4.0 3.5 - 5.1 mmol/L   Chloride 105 98 - 111 mmol/L   CO2 22 22 - 32 mmol/L   Glucose, Bld 165 (H) 70 - 99 mg/dL    Comment: Glucose reference range applies only to samples taken after fasting for at least 8 hours.   BUN 11 8 - 23 mg/dL   Creatinine, Ser 4.69 0.44 - 1.00 mg/dL   Calcium  7.9 (  L) 8.9 - 10.3 mg/dL   Total Protein 4.8 (L) 6.5 - 8.1 g/dL   Albumin 2.4 (L) 3.5 - 5.0 g/dL   AST 15 15 - 41 U/L   ALT 15 0 - 44 U/L   Alkaline Phosphatase 52 38 - 126 U/L   Total Bilirubin 0.8 0.0 - 1.2 mg/dL   GFR, Estimated >40 >98 mL/min    Comment: (NOTE) Calculated using the CKD-EPI Creatinine Equation (2021)    Anion gap 9 5 - 15    Comment: Performed at [facility], [address], [place], [place] [address]  Magnesium     Status: None   Collection Time: 04/15/24  5:36 AM  Result Value Ref Range   Magnesium 1.7 1.7 - 2.4 mg/dL    Comment: Performed at [facility], [address] [place], [place] [address]  Phosphorus     Status: None   Collection Time: 04/15/24  5:36 AM  Result Value Ref Range   Phosphorus 4.1 2.5 - 4.6  mg/dL    Comment: Performed at [facility], [address] [place], [place] [address]  Brain natriuretic peptide     Status: Abnormal   Collection Time: 04/15/24  5:37 AM  Result Value Ref Range   B Natriuretic Peptide 184.9 (H) 0.0 - 100.0 pg/mL    Comment: Performed at [facility], [address], [place], [place] [address]  Blood gas, venous     Status: Abnormal   Collection Time: 04/15/24  6:03 AM  Result Value Ref Range   pH, Ven 7.4 7.25 - 7.43   pCO2, Ven 38 (L) 44 - 60 mmHg   pO2, Ven 38 32 - 45 mmHg   Bicarbonate 23.5 20.0 - 28.0 mmol/L   Acid-base deficit 1.1 0.0 - 2.0 mmol/L   O2 Saturation 64.1 %   Patient temperature 37.0     Comment: Performed at [facility], [address] [place], [place] [address]  Procalcitonin     Status: None   Collection Time: 04/16/24  5:20 AM  Result Value Ref Range   Procalcitonin 0.46 ng/mL    Comment:        Interpretation: PCT (Procalcitonin) <= 0.5 ng/mL: Systemic infection (sepsis) is not likely. Local bacterial infection is possible. (NOTE)       Sepsis PCT Algorithm           Lower Respiratory Tract                                      Infection PCT Algorithm    ----------------------------     ----------------------------         PCT < 0.25 ng/mL                PCT < 0.10 ng/mL          Strongly encourage             Strongly discourage   discontinuation of antibiotics    initiation of antibiotics    ----------------------------     -----------------------------       PCT 0.25 - 0.50 ng/mL            PCT 0.10 - 0.25 ng/mL               OR       >80% decrease in PCT  Discourage initiation of                                            antibiotics      Encourage discontinuation           of antibiotics    ----------------------------     -----------------------------         PCT >= 0.50 ng/mL              PCT 0.26 - 0.50 ng/mL               AND        <80% decrease in PCT             Encourage  initiation of                                             antibiotics       Encourage continuation           of antibiotics    ----------------------------     -----------------------------        PCT >= 0.50 ng/mL                  PCT > 0.50 ng/mL               AND         increase in PCT                  Strongly encourage                                      initiation of antibiotics    Strongly encourage escalation           of antibiotics                                     -----------------------------                                           PCT <= 0.25 ng/mL                                                 OR                                        > 80% decrease in PCT                                      Discontinue / Do not initiate  antibiotics  Performed at [facility], [address] [place], [place] [address]   C-reactive protein     Status: Abnormal   Collection Time: 04/16/24  5:20 AM  Result Value Ref Range   CRP 6.3 (H) <1.0 mg/dL    Comment: Performed at [facility], [address], [place], [place] [address]  Brain natriuretic peptide     Status: Abnormal   Collection Time: 04/16/24  5:21 AM  Result Value Ref Range   B Natriuretic Peptide 219.6 (H) 0.0 - 100.0 pg/mL    Comment: Performed at [facility], [address] [place], [place] [address]  MRSA Next Gen by PCR, Nasal     Status: None   Collection Time: 04/16/24  5:42 AM   Specimen: Nasal Mucosa; Nasal Swab  Result Value Ref Range   MRSA by PCR Next Gen NOT DETECTED NOT DETECTED    Comment: (NOTE) The GeneXpert MRSA Assay (FDA approved for NASAL specimens only), is one component of a comprehensive MRSA colonization surveillance program. It is not intended to diagnose MRSA infection nor to guide or monitor treatment for MRSA infections. Test performance is not FDA approved in patients less than 72 years old. Performed at [facility], [address], [place], [place] [address]   Glucose, capillary     Status: Abnormal   Collection Time: 04/16/24  8:11 AM  Result Value Ref Range   Glucose-Capillary 129 (H) 70 - 99 mg/dL    Comment: Glucose reference range applies only to samples taken after fasting for at least 8 hours.    MICRO: reviewed IMAGING: CT Chest Wo Contrast Result Date: 04/14/2024 CLINICAL DATA:  Pneumonia complication suspected. History of COPD. Shortness of breath and hypoxia. EXAM: CT CHEST WITHOUT CONTRAST TECHNIQUE: Multidetector CT imaging of the chest was performed following the standard protocol without IV contrast. RADIATION DOSE REDUCTION: This exam was performed according to the departmental dose-optimization program which includes automated exposure control, adjustment of the mA and/or kV according to patient size and/or use of iterative reconstruction technique. COMPARISON:  Same day chest radiograph and CT chest 06/26/2022 FINDINGS: Cardiovascular: Normal heart size. No pericardial effusion. Mild aortic atherosclerotic calcification. Mediastinum/Nodes: Trachea is patent. Debris is present in the right mainstem bronchus. Unremarkable esophagus. No thoracic adenopathy noting limited sensitivity of noncontrast exam. Lungs/Pleura: Advanced emphysema. Diffuse bronchial wall thickening greatest in the lower lobes with scattered mucous plugs. Interlobular septal thickening and patchy airspace opacities in the right middle lobe and right lower lobe. No pleural effusion or pneumothorax. Upper Abdomen: No acute abnormality. Musculoskeletal: Acute mildly displaced fracture of the posterior left eleventh rib IMPRESSION: 1. Right lower and middle lobe bronchopneumonia superimposed on advanced COPD. 2. Debris in the right mainstem bronchus with mucous plugging in the lower lobes and right middle lobe. Query aspiration. 3. Acute mildly displaced fracture of the posterior left eleventh rib. Aortic Atherosclerosis  (ICD10-I70.0) and Emphysema (ICD10-J43.9). Electronically Signed   By: Rozell Cornet M.D.   On: 04/14/2024 23:04   DG Chest Portable 1 View Result Date: 04/14/2024 CLINICAL DATA:  Shortness of breath. Evaluation for left-sided pneumothorax. EXAM: PORTABLE CHEST 1 VIEW COMPARISON:  04/02/2024 FINDINGS: Hyperinflation and chronic bronchitic change. Airspace interstitial opacities in the right lower lung. No pleural effusion or pneumothorax. Stable cardiomediastinal silhouette. IMPRESSION: 1. Right lower lung pneumonia superimposed on a background of COPD. Electronically Signed   By: Rozell Cornet M.D.   On: 04/14/2024 22:58  HISTORICAL MICRO/IMAGING  Assessment/Plan:  63yo F with strep pneumonia, not responded to oral abtx and possible staph species bacteremia.  -will increase ceftriaxone  to 2gm IV daily for the time being - recommend to stop azithromycin  - will ask lab to identify the 2nd GPC to see if same species. - suspect it maybe still contaminant, skin is dry and brittle - leukocytosis = has marked elevation on admit, recommend to repeat to see she is improving - mucous plugging on chest CT = recommend acapella valve to help with pulmonary hygiene  More recs to follow as info returns from micro lab  evaluation of this patient requires complex antimicrobial therapy evaluation and counseling and isolation needs for disease transmission risk assessment and mitigation.    Gerold Kos Levern Reader MD MPH [facility] [phone]

## 2024-04-16 NOTE — Progress Notes (Signed)
   04/16/24 1137  TOC Brief Assessment  Insurance and Status Reviewed Administrator Medicare HMO /PPO)  Patient has primary care physician Yes ([place], [place] Gardener, FNP)  Home environment has been reviewed From home w/Adult mentally retarded Son  Prior level of function: independent  Prior/Current Home Services No current home services  Social Drivers of Health Review SDOH reviewed interventions complete (Smoking cessation information attached to DC instructions)  Readmission risk has been reviewed Yes (15%)  Transition of care needs transition of care needs identified, TOC will continue to follow (following for HOT needs)   Patient currently weaning from O2 but may need for DC to home  Ambulation test completed this am. Provider still wanting to attempt weaning  Patient states her car is at the Urgent Care here at [facility]) and she will drive herself home at DC  [facility] will continue to follow patient for any additional discharge needs

## 2024-04-16 NOTE — Progress Notes (Signed)
 Pt was up ambulating in room/ to bathroom during shift. Ambulated independently with no SOB, or additional oxygen requirements above 2L, O2 sats ranging from  90-92%  while ambulating. Pt OOB to chair for most of daytime shift.

## 2024-04-16 NOTE — Plan of Care (Signed)

## 2024-04-16 NOTE — Evaluation (Signed)
 Physical Therapy Evaluation Patient Details Name: Candace Middleton MRN: [id number] DOB: [birth year] Today's Date: 04/16/2024  History of Present Illness  The pt is a 63 yo female presenting 4/21 with SOB, work up revealed severe sepsis due to PNA. PMH includes: tobacco use, COPD, CAD, and HLD.  Clinical Impression  Pt in bed upon arrival of PT, agreeable to evaluation at this time. Prior to admission the pt was independent with all mobility, reports no use of DME or O2 at home. She lives with her adult son who has intellectual disability. The pt was able to demo good independence with bed mobility and transfers, but did require 2L O2 to maintain SpO2 >90%. The pt tolerated balance challenge well, will benefit from continued skilled PT acutely to progress endurance and activity tolerance, as well as mobility specialists for additional mobility during admission. Anticipate no additional PT needs after d/c.       If plan is discharge home, recommend the following: Assistance with cooking/housework;Assist for transportation   Can travel by private vehicle        Equipment Recommendations Other (comment) (O2)  Recommendations for Other Services       Functional Status Assessment Patient has had a recent decline in their functional status and demonstrates the ability to make significant improvements in function in a reasonable and predictable amount of time.     Precautions / Restrictions Precautions Precautions: Fall Recall of Precautions/Restrictions: Intact Precaution/Restrictions Comments: watch SpO2 Restrictions Weight Bearing Restrictions Per Provider Order: No      Mobility  Bed Mobility Overal bed mobility: Independent             General bed mobility comments: SpO2 to 90% on 2L    Transfers Overall transfer level: Independent Equipment used: None               General transfer comment: SpO2 to 88% on RA, improved to 95% on 2L     Ambulation/Gait Ambulation/Gait assistance: Supervision Gait Distance (Feet): 175 Feet Assistive device: None Gait Pattern/deviations: WFL(Within Functional Limits) Gait velocity: decreased Gait velocity interpretation: <1.8 ft/sec, indicate of risk for recurrent falls   General Gait Details: slightly slowed from baseline but stable with all balance challenge. SpO2 to low o 90% on 2L    Balance Overall balance assessment: No apparent balance deficits (not formally assessed)                                           Pertinent Vitals/Pain Pain Assessment Pain Assessment: 0-10 Pain Score: 8  Pain Location: L rib Pain Descriptors / Indicators: Discomfort Pain Intervention(s): Limited activity within patient's tolerance, Monitored during session, Repositioned    Home Living Family/patient expects to be discharged to:: Private residence Living Arrangements: Children Available Help at Discharge: Neighbor;Available PRN/intermittently Type of Home: House Home Access: Stairs to enter Entrance Stairs-Rails: Doctor, general practice of Steps: 3-4 at front with both rails, 10 at side with L rail   Home Layout: One level Home Equipment: Grab bars - tub/shower      Prior Function Prior Level of Function : Independent/Modified Independent;Driving             Mobility Comments: walks for exercise irregularly, active with housework and yardwork ADLs Comments: idependent, takes care of mentally handicapped 41 yo son     Extremity/Trunk Assessment   Upper Extremity Assessment Upper Extremity Assessment: Overall  WFL for tasks assessed (frail withlimited endurance but WFL to MMT)    Lower Extremity Assessment Lower Extremity Assessment: Overall WFL for tasks assessed (frail withlimited endurance but WFL to MMT)    Cervical / Trunk Assessment Cervical / Trunk Assessment: Other exceptions Cervical / Trunk Exceptions: frail  Communication    Communication Communication: No apparent difficulties    Cognition Arousal: Alert Behavior During Therapy: WFL for tasks assessed/performed   PT - Cognitive impairments: No apparent impairments                         Following commands: Intact       Cueing Cueing Techniques: Verbal cues     General Comments General comments (skin integrity, edema, etc.): SpO2 to low of 88% on RA sitting EOB, maintained 95% on 2L at rest, low o f90% on 2L walking    Exercises Other Exercises Other Exercises: 30 sec sit-stand, completed 15 in 30 sec   Assessment/Plan    PT Assessment Patient needs continued PT services  PT Problem List Cardiopulmonary status limiting activity;Decreased activity tolerance       PT Treatment Interventions Stair training;Functional mobility training;Therapeutic activities;Therapeutic exercise;Patient/family education    PT Goals (Current goals can be found in the Care Plan section)  Acute Rehab PT Goals Patient Stated Goal: return to gardening PT Goal Formulation: With patient Time For Goal Achievement: 04/30/24 Potential to Achieve Goals: Good    Frequency Min 2X/week        AM-PAC PT "6 Clicks" Mobility  Outcome Measure Help needed turning from your back to your side while in a flat bed without using bedrails?: None Help needed moving from lying on your back to sitting on the side of a flat bed without using bedrails?: None Help needed moving to and from a bed to a chair (including a wheelchair)?: None Help needed standing up from a chair using your arms (e.g., wheelchair or bedside chair)?: None Help needed to walk in hospital room?: A Little Help needed climbing 3-5 steps with a railing? : A Little 6 Click Score: 22    End of Session Equipment Utilized During Treatment: Oxygen Activity Tolerance: Patient tolerated treatment well Patient left: in chair;with call bell/phone within reach;with nursing/sitter in room Nurse Communication:  Mobility status PT Visit Diagnosis: Muscle weakness (generalized) (M62.81);Other abnormalities of gait and mobility (R26.89)    Time: 1610-9604 PT Time Calculation (min) (ACUTE ONLY): 25 min   Charges:   PT Evaluation $PT Eval Low Complexity: 1 Low PT Treatments $Therapeutic Exercise: 8-22 mins PT General Charges $$ ACUTE PT VISIT: 1 Visit         Barnabas Booth, PT, DPT   Acute Rehabilitation Department Office [phone] Secure Chat Communication Preferred  Lona Rist 04/16/2024, 9:12 AM

## 2024-04-16 NOTE — Progress Notes (Signed)
 Ok to [employer] doxy since urinary strep positive and add lovenox  30mg  SQ for DVT prophylaxis per Dr Zelda Hickman.  Ivery Marking, PharmD, BCIDP, AAHIVP, CPP Infectious Disease Pharmacist 04/16/2024 7:46 AM

## 2024-04-16 NOTE — Progress Notes (Signed)
 PROGRESS NOTE    MARCILLE BARMAN  WUJ:[id number] DOB: 1961/03/08 DOA: 04/14/2024 PCP: Abelino Hof, FNP    Brief Narrative:   Candace Middleton is a 63 y.o. female with past medical history significant for COPD in which she is not oxygen dependent, HLD, tobacco use disorder who presented to [facility] ED on 04/14/2024 with 2-week history of progressive shortness of breath, cough, fever/chills.  Recently diagnosed with right lower lobe pneumonia and completed 1 week course of Augmentin  and azithromycin .  Despite compliance with antibiotics, she reported no improvement in her symptoms; thus prompting further evaluation in the ED.  In the ED, temperature 100.3 F, HR 99, RR 26, BP 107/60, SpO2 93% on 4 L nasal cannula.  WBC 29.6, hemoglobin 15.2, platelet count 323.  Sodium 133, potassium 3.9, chloride 100, CO2 22, glucose 96, BUN 9, creatinine 0.73.  Magnesium 1.7.  Procalcitonin 0.15.  COVID/influenza/RSV PCR negative.  Chest x-ray with right lower lobe pneumonia superimposed on a background of COPD.  CT chest without contrast with right lower and middle lobe bronchopneumonia superimposed on advanced COPD, debris's in the right mainstem bronchus with mucous plugging in the lower lobes and right middle lobe query aspiration, acute mildly displaced fracture of the posterior left 11th rib, aortic atherosclerosis.  Blood cultures x 2 obtained.  Patient was administered acetaminophen  650 mg p.o. x 1 dose, Rocephin , doxycycline , normal saline x 1 L bolus.  TRH was consulted for admission for further evaluation management of acute hypoxic respite failure secondary to community-acquired pneumonia, COPD exacerbation.    Assessment & Plan:   Acute hypoxic respiratory failure, POA due to right sided streptococcal pneumonia pneumonia.  CT chest suspicious for aspiration, long with mild COPD exacerbation.  She is positive for strep pneumonia urinary antigen, CT chest positive for  right-sided disease with some evidence of possible ongoing aspiration, she has been placed on appropriate antibiotics, continue supplemental oxygen and nebulizer treatments, also on a trial of steroid for COPD exacerbation.  Encouraged to sit in chair use I-S and flutter valve pulmonary toiletry, speech therapy evaluation.  Still has significant leukocytosis but clinically improving continue to monitor.  Blood cultures noted, question contamination, will check with ID.   SpO2: 97 % O2 Flow Rate (L/min): 2 L/min  COPD exacerbation  -- Breztri  2 puffs BID, trial of steroids, minimal wheezing, nebulizer treatments as needed.  Advance activity titrate on oxygen, still smoking counseled to quit.  Ongoing smoking.  Counseled to quit.    Essential hypertension  -- Holding home amlodipine  for now, blood pressure soft will monitor closely.   Hyperlipidemia  -- Atorvastatin  20 mg p.o. daily   DVT prophylaxis: enoxaparin  (LOVENOX ) injection 30 mg Start: 04/16/24 1000 SCDs Start: 04/14/24 2136    Code Status: Full Code Family Communication: No family present at bedside this morning  Disposition Plan:  Level of care: Progressive Status is: Inpatient Remains inpatient appropriate because: I have antibiotics, needs further weaning from oxygen, anticipate discharge home in 1-2 days    Consultants:  None  Procedures:  None     Subjective:  Patient in bed, appears comfortable, denies any headache, no fever, no chest pain or pressure, positive ongoing productive cough and mild exertional shortness of breath , no abdominal pain. No focal weakness.  Objective: Vitals:   04/16/24 0400 04/16/24 0500 04/16/24 0800 04/16/24 0854  BP:   95/60 (!) 113/57  Pulse:   (!) 59 88  Resp:      Temp: 97.7  F (36.5 C)  (!) 97.5 F (36.4 C)   TempSrc: Oral  Oral   SpO2:   97%   Weight:  36.4 kg    Height:        Intake/Output Summary (Last 24 hours) at 04/16/2024 0955 Last data filed at 04/16/2024  0745 Gross per 24 hour  Intake 764.67 ml  Output --  Net 764.67 ml   Filed Weights   04/14/24 1900 04/16/24 0500  Weight: 34.9 kg 36.4 kg    Physical Exam:  Awake Alert, No new F.N deficits, Normal affect [place].AT,PERRAL Supple Neck, No JVD,   Symmetrical Chest wall movement, reduced right lower breath sounds with some coarse crackles in the right base RRR,No Gallops, Rubs or new Murmurs,  +ve B.Sounds, Abd Soft, No tenderness,   No Cyanosis, Clubbing or edema     Data Reviewed: I have personally reviewed following labs and imaging studies   Data Review:   Inpatient Medications  Scheduled Meds:  aspirin  EC  81 mg Oral Daily   atorvastatin   20 mg Oral QPM   budeson-glycopyrrolate -formoterol   2 puff Inhalation BID   enoxaparin  (LOVENOX ) injection  30 mg Subcutaneous Q24H   guaiFENesin   600 mg Oral BID   metoprolol  succinate  12.5 mg Oral Daily   predniSONE   40 mg Oral Q breakfast   Continuous Infusions:  cefTRIAXone  (ROCEPHIN )  IV     PRN Meds:.acetaminophen  **OR** acetaminophen , albuterol , benzonatate , melatonin, nicotine , ondansetron  (ZOFRAN ) IV   Recent Labs  Lab 04/14/24 1904 04/15/24 0536  WBC 29.6* 33.0*  HGB 15.2* 13.3  HCT 44.9 40.0  PLT 323 269  MCV 93.2 94.6  MCH 31.5 31.4  MCHC 33.9 33.3  RDW 13.4 13.5  LYMPHSABS  --  1.0  MONOABS  --  0.8  EOSABS  --  0.0  BASOSABS  --  0.1    Recent Labs  Lab 04/14/24 1904 04/14/24 2118 04/15/24 0536 04/15/24 0537 04/16/24 0520 04/16/24 0521  NA 133*  --  136  --   --   --   K 3.9  --  4.0  --   --   --   CL 100  --  105  --   --   --   CO2 22  --  22  --   --   --   ANIONGAP 11  --  9  --   --   --   GLUCOSE 96  --  165*  --   --   --   BUN 9  --  11  --   --   --   CREATININE 0.73  --  0.67  --   --   --   AST  --   --  15  --   --   --   ALT  --   --  15  --   --   --   ALKPHOS  --   --  52  --   --   --   BILITOT  --   --  0.8  --   --   --   ALBUMIN  --   --  2.4*  --   --   --   CRP  --    --   --   --  6.3*  --   PROCALCITON 0.15  --   --   --  0.46  --   LATICACIDVEN  --  1.0  --   --   --   --  BNP  --   --   --  184.9*  --  219.6*  MG 1.7  --  1.7  --   --   --   PHOS  --   --  4.1  --   --   --   CALCIUM  8.7*  --  7.9*  --   --   --       Recent Labs  Lab 04/14/24 1904 04/14/24 2118 04/15/24 0536 04/15/24 0537 04/16/24 0520 04/16/24 0521  CRP  --   --   --   --  6.3*  --   PROCALCITON 0.15  --   --   --  0.46  --   LATICACIDVEN  --  1.0  --   --   --   --   BNP  --   --   --  184.9*  --  219.6*  MG 1.7  --  1.7  --   --   --   CALCIUM  8.7*  --  7.9*  --   --   --     --------------------------------------------------------------------------------------------------------------- Lab Results  Component Value Date   CHOL 181 09/25/2018   HDL 68 09/25/2018   LDLCALC 97 09/25/2018   TRIG 78 09/25/2018   CHOLHDL 2.7 09/25/2018    No results found for: "HGBA1C" No results for input(s): "TSH", "T4TOTAL", "FREET4", "T3FREE", "THYROIDAB" in the last 72 hours. No results for input(s): "VITAMINB12", "FOLATE", "FERRITIN", "TIBC", "IRON", "RETICCTPCT" in the last 72 hours. ------------------------------------------------------------------------------------------------------------------ Cardiac Enzymes No results for input(s): "CKMB", "TROPONINI", "MYOGLOBIN" in the last 168 hours.  Invalid input(s): "CK"  Micro Results Recent Results (from the past 240 hours)  Resp panel by RT-PCR (RSV, Flu A&B, Covid) Anterior Nasal Swab     Status: None   Collection Time: 04/14/24  7:28 PM   Specimen: Anterior Nasal Swab  Result Value Ref Range Status   SARS Coronavirus 2 by RT PCR NEGATIVE NEGATIVE Final   Influenza A by PCR NEGATIVE NEGATIVE Final   Influenza B by PCR NEGATIVE NEGATIVE Final    Comment: (NOTE) The Xpert Xpress SARS-CoV-2/FLU/RSV plus assay is intended as an aid in the diagnosis of influenza from Nasopharyngeal swab specimens and should not be used  as a sole basis for treatment. Nasal washings and aspirates are unacceptable for Xpert Xpress SARS-CoV-2/FLU/RSV testing.  Fact Sheet for Patients: BloggerCourse.com  Fact Sheet for Healthcare Providers: SeriousBroker.it  This test is not yet approved or cleared by the [place]  FDA and has been authorized for detection and/or diagnosis of SARS-CoV-2 by FDA under an Emergency Use Authorization (EUA). This EUA will remain in effect (meaning this test can be used) for the duration of the COVID-19 declaration under Section 564(b)(1) of the Act, 21 U.S.C. section 360bbb-3(b)(1), unless the authorization is terminated or revoked.     Resp Syncytial Virus by PCR NEGATIVE NEGATIVE Final    Comment: (NOTE) Fact Sheet for Patients: BloggerCourse.com  Fact Sheet for Healthcare Providers: SeriousBroker.it  This test is not yet approved or cleared by the [place]  FDA and has been authorized for detection and/or diagnosis of SARS-CoV-2 by FDA under an Emergency Use Authorization (EUA). This EUA will remain in effect (meaning this test can be used) for the duration of the COVID-19 declaration under Section 564(b)(1) of the Act, 21 U.S.C. section 360bbb-3(b)(1), unless the authorization is terminated or revoked.  Performed at [facility], [address], [place], [place] [address]   Blood culture (routine x  2)     Status: None (Preliminary result)   Collection Time: 04/14/24  8:39 PM   Specimen: BLOOD  Result Value Ref Range Status   Specimen Description BLOOD RIGHT ANTECUBITAL  Final   Special Requests   Final    BOTTLES DRAWN AEROBIC AND ANAEROBIC Blood Culture adequate volume   Culture  Setup Time   Final    GRAM POSITIVE COCCI IN CLUSTERS ANAEROBIC BOTTLE ONLY CRITICAL RESULT CALLED TO, READ BACK BY AND VERIFIED WITH: PHARMD JESSICA MILLEN ON 04/15/24 @ 2010 BY  DRT Performed at [facility], [address] [place], [place] [address]    Culture GRAM POSITIVE COCCI  Final   Report Status PENDING  Incomplete  Blood Culture ID Panel (Reflexed)     Status: Abnormal   Collection Time: 04/14/24  8:39 PM  Result Value Ref Range Status   Enterococcus faecalis NOT DETECTED NOT DETECTED Final   Enterococcus Faecium NOT DETECTED NOT DETECTED Final   Listeria monocytogenes NOT DETECTED NOT DETECTED Final   Staphylococcus species DETECTED (A) NOT DETECTED Final    Comment: CRITICAL RESULT CALLED TO, READ BACK BY AND VERIFIED WITH: PHARMD JESSICA MILLEN ON 04/15/24 @ 2010 BY DRT    Staphylococcus aureus (BCID) NOT DETECTED NOT DETECTED Final   Staphylococcus epidermidis DETECTED (A) NOT DETECTED Final    Comment: CRITICAL RESULT CALLED TO, READ BACK BY AND VERIFIED WITH: PHARMD JESSICA MILLEN ON 04/15/24 @ 2010 BY DRT    Staphylococcus lugdunensis NOT DETECTED NOT DETECTED Final   Streptococcus species NOT DETECTED NOT DETECTED Final   Streptococcus agalactiae NOT DETECTED NOT DETECTED Final   Streptococcus pneumoniae NOT DETECTED NOT DETECTED Final   Streptococcus pyogenes NOT DETECTED NOT DETECTED Final   A.calcoaceticus-baumannii NOT DETECTED NOT DETECTED Final   Bacteroides fragilis NOT DETECTED NOT DETECTED Final   Enterobacterales NOT DETECTED NOT DETECTED Final   Enterobacter cloacae complex NOT DETECTED NOT DETECTED Final   Escherichia coli NOT DETECTED NOT DETECTED Final   Klebsiella aerogenes NOT DETECTED NOT DETECTED Final   Klebsiella oxytoca NOT DETECTED NOT DETECTED Final   Klebsiella pneumoniae NOT DETECTED NOT DETECTED Final   Proteus species NOT DETECTED NOT DETECTED Final   Salmonella species NOT DETECTED NOT DETECTED Final   Serratia marcescens NOT DETECTED NOT DETECTED Final   Haemophilus influenzae NOT DETECTED NOT DETECTED Final   Neisseria meningitidis NOT DETECTED NOT DETECTED Final   Pseudomonas aeruginosa NOT DETECTED  NOT DETECTED Final   Stenotrophomonas maltophilia NOT DETECTED NOT DETECTED Final   Candida albicans NOT DETECTED NOT DETECTED Final   Candida auris NOT DETECTED NOT DETECTED Final   Candida glabrata NOT DETECTED NOT DETECTED Final   Candida krusei NOT DETECTED NOT DETECTED Final   Candida parapsilosis NOT DETECTED NOT DETECTED Final   Candida tropicalis NOT DETECTED NOT DETECTED Final   Cryptococcus neoformans/gattii NOT DETECTED NOT DETECTED Final   Methicillin resistance mecA/C NOT DETECTED NOT DETECTED Final    Comment: Performed at [facility], [address], [place], [place] [address]  Blood culture (routine x 2)     Status: None (Preliminary result)   Collection Time: 04/14/24  8:44 PM   Specimen: BLOOD  Result Value Ref Range Status   Specimen Description BLOOD LEFT ANTECUBITAL  Final   Special Requests   Final    BOTTLES DRAWN AEROBIC AND ANAEROBIC Blood Culture adequate volume   Culture  Setup Time   Final    GRAM POSITIVE COCCI IN CLUSTERS  BOTTLES DRAWN AEROBIC ONLY CRITICAL VALUE NOTED.  VALUE IS CONSISTENT WITH PREVIOUSLY REPORTED AND CALLED VALUE. Performed at [facility], [address], [place], [place] [address]    Culture GRAM POSITIVE COCCI  Final   Report Status PENDING  Incomplete  MRSA Next Gen by PCR, Nasal     Status: None   Collection Time: 04/16/24  5:42 AM   Specimen: Nasal Mucosa; Nasal Swab  Result Value Ref Range Status   MRSA by PCR Next Gen NOT DETECTED NOT DETECTED Final    Comment: (NOTE) The GeneXpert MRSA Assay (FDA approved for NASAL specimens only), is one component of a comprehensive MRSA colonization surveillance program. It is not intended to diagnose MRSA infection nor to guide or monitor treatment for MRSA infections. Test performance is not FDA approved in patients less than 35 years old. Performed at [facility], [address] [place], [place] [address]     Radiology Reports  CT Chest Wo Contrast Result  Date: 04/14/2024 CLINICAL DATA:  Pneumonia complication suspected. History of COPD. Shortness of breath and hypoxia. EXAM: CT CHEST WITHOUT CONTRAST TECHNIQUE: Multidetector CT imaging of the chest was performed following the standard protocol without IV contrast. RADIATION DOSE REDUCTION: This exam was performed according to the departmental dose-optimization program which includes automated exposure control, adjustment of the mA and/or kV according to patient size and/or use of iterative reconstruction technique. COMPARISON:  Same day chest radiograph and CT chest 06/26/2022 FINDINGS: Cardiovascular: Normal heart size. No pericardial effusion. Mild aortic atherosclerotic calcification. Mediastinum/Nodes: Trachea is patent. Debris is present in the right mainstem bronchus. Unremarkable esophagus. No thoracic adenopathy noting limited sensitivity of noncontrast exam. Lungs/Pleura: Advanced emphysema. Diffuse bronchial wall thickening greatest in the lower lobes with scattered mucous plugs. Interlobular septal thickening and patchy airspace opacities in the right middle lobe and right lower lobe. No pleural effusion or pneumothorax. Upper Abdomen: No acute abnormality. Musculoskeletal: Acute mildly displaced fracture of the posterior left eleventh rib IMPRESSION: 1. Right lower and middle lobe bronchopneumonia superimposed on advanced COPD. 2. Debris in the right mainstem bronchus with mucous plugging in the lower lobes and right middle lobe. Query aspiration. 3. Acute mildly displaced fracture of the posterior left eleventh rib. Aortic Atherosclerosis (ICD10-I70.0) and Emphysema (ICD10-J43.9). Electronically Signed   By: Rozell Cornet M.D.   On: 04/14/2024 23:04   DG Chest Portable 1 View Result Date: 04/14/2024 CLINICAL DATA:  Shortness of breath. Evaluation for left-sided pneumothorax. EXAM: PORTABLE CHEST 1 VIEW COMPARISON:  04/02/2024 FINDINGS: Hyperinflation and chronic bronchitic change. Airspace  interstitial opacities in the right lower lung. No pleural effusion or pneumothorax. Stable cardiomediastinal silhouette. IMPRESSION: 1. Right lower lung pneumonia superimposed on a background of COPD. Electronically Signed   By: Rozell Cornet M.D.   On: 04/14/2024 22:58      Signature  -   Lynnwood Sauer M.D on 04/16/2024 at 9:56 AM   -  To page go to [email or web address]

## 2024-04-16 NOTE — Progress Notes (Signed)
 SATURATION QUALIFICATIONS: (This note is used to comply with regulatory documentation for home oxygen)  Patient Saturations on Room Air at Rest = 88%  Patient Saturations on 2 Liters of oxygen at Rest = 95%  Patient Saturations on Room Air while Ambulating = NT  Patient Saturations on 2 Liters of oxygen while Ambulating = 90%  Please briefly explain why patient needs home oxygen: Pt unable to maintain SpO2 >90% without O2 at this time.   Barnabas Booth, PT, DPT   Acute Rehabilitation Department Office [phone] Secure Chat Communication Preferred

## 2024-04-17 ENCOUNTER — Inpatient Hospital Stay (HOSPITAL_COMMUNITY)

## 2024-04-17 DIAGNOSIS — J189 Pneumonia, unspecified organism: Secondary | ICD-10-CM | POA: Diagnosis not present

## 2024-04-17 LAB — CBC WITH DIFFERENTIAL/PLATELET
Abs Immature Granulocytes: 0.04 10*3/uL (ref 0.00–0.07)
Basophils Absolute: 0 10*3/uL (ref 0.0–0.1)
Basophils Relative: 0 %
Eosinophils Absolute: 0 10*3/uL (ref 0.0–0.5)
Eosinophils Relative: 0 %
HCT: 39.8 % (ref 36.0–46.0)
Hemoglobin: 12.5 g/dL (ref 12.0–15.0)
Immature Granulocytes: 0 %
Lymphocytes Relative: 19 %
Lymphs Abs: 2.9 10*3/uL (ref 0.7–4.0)
MCH: 31.1 pg (ref 26.0–34.0)
MCHC: 31.4 g/dL (ref 30.0–36.0)
MCV: 99 fL (ref 80.0–100.0)
Monocytes Absolute: 1.1 10*3/uL — ABNORMAL HIGH (ref 0.1–1.0)
Monocytes Relative: 7 %
Neutro Abs: 11 10*3/uL — ABNORMAL HIGH (ref 1.7–7.7)
Neutrophils Relative %: 74 %
Platelets: 213 10*3/uL (ref 150–400)
RBC: 4.02 MIL/uL (ref 3.87–5.11)
RDW: 14 % (ref 11.5–15.5)
WBC: 15 10*3/uL — ABNORMAL HIGH (ref 4.0–10.5)
nRBC: 0 % (ref 0.0–0.2)

## 2024-04-17 LAB — BASIC METABOLIC PANEL WITH GFR
Anion gap: 9 (ref 5–15)
BUN: 10 mg/dL (ref 8–23)
CO2: 19 mmol/L — ABNORMAL LOW (ref 22–32)
Calcium: 8.1 mg/dL — ABNORMAL LOW (ref 8.9–10.3)
Chloride: 109 mmol/L (ref 98–111)
Creatinine, Ser: 0.53 mg/dL (ref 0.44–1.00)
GFR, Estimated: >60 mL/min (ref >60)
Glucose, Bld: 143 mg/dL — ABNORMAL HIGH (ref 70–99)
Potassium: 4.4 mmol/L (ref 3.5–5.1)
Sodium: 137 mmol/L (ref 135–145)

## 2024-04-17 LAB — CULTURE, BLOOD (ROUTINE X 2)
Special Requests: ADEQUATE
Special Requests: ADEQUATE

## 2024-04-17 LAB — PROCALCITONIN: Procalcitonin: 0.24 ng/mL

## 2024-04-17 LAB — C-REACTIVE PROTEIN: CRP: 1.8 mg/dL — ABNORMAL HIGH (ref <1.0)

## 2024-04-17 LAB — GLUCOSE, CAPILLARY: Glucose-Capillary: 126 mg/dL — ABNORMAL HIGH (ref 70–99)

## 2024-04-17 LAB — MAGNESIUM: Magnesium: 1.8 mg/dL (ref 1.7–2.4)

## 2024-04-17 LAB — BRAIN NATRIURETIC PEPTIDE: B Natriuretic Peptide: 257.9 pg/mL — ABNORMAL HIGH (ref 0.0–100.0)

## 2024-04-17 NOTE — Plan of Care (Signed)
  Problem: Elimination: Goal: Will not experience complications related to bowel motility Outcome: Progressing Goal: Will not experience complications related to urinary retention Outcome: Progressing   Problem: Pain Managment: Goal: General experience of comfort will improve and/or be controlled Outcome: Progressing   Problem: Skin Integrity: Goal: Risk for impaired skin integrity will decrease Outcome: Progressing

## 2024-04-17 NOTE — Progress Notes (Signed)
 Modified Barium Swallow Study  Patient Details  Name: Candace Middleton MRN: [id number] Date of Birth: [birth year]  Today's Date: 04/17/2024  Modified Barium Swallow completed.  Full report located under Chart Review in the Imaging Section.  History of Present Illness Candace Middleton is a 63 yo female presenting ot ED 4/21 with SOB. Workup revealed severe sepsis due to PNA. CT Chest shows R lower and middle lobe bronchopneumonia superimposed on advanced COPD and debris in the R mainstem bronchus with mucous plugging in the lower lobes and R middle lobe. PMH includes tobacco use, COPD, CAD, HLD   Clinical Impression Pt presents with a functional oropharyngeal swallow. No penetration/aspiration occurred throughout the study. There is minimal vallecular and pyriform sinus residue, which is considered functional. An esophagram was completed immediately following this MBS, so giving the barium tablet was deferred. Continue pt's regular diet with thin liquids. Ongoing SLP f/u is not indicated at this time, will sign off.  Factors that may increase risk of adverse event in presence of aspiration Roderick Civatte & Jessy Morocco 2021): Respiratory or GI disease  Swallow Evaluation Recommendations Recommendations: PO diet PO Diet Recommendation: Regular;Thin liquids (Level 0) Liquid Administration via: Cup;Straw Medication Administration: Whole meds with liquid Supervision: Patient able to self-feed Swallowing strategies  : Slow rate;Small bites/sips Postural changes: Position pt fully upright for meals;Stay upright 30-60 min after meals Oral care recommendations: Oral care BID (2x/day)    Amil Kale, M.A., CCC-SLP Speech Language Pathology, Acute Rehabilitation Services  Secure Chat preferred [phone]  04/17/2024,10:06 AM

## 2024-04-17 NOTE — Progress Notes (Signed)
 PROGRESS NOTE    Candace Middleton  WJX:[id number] DOB: [birth year] DOA: 04/14/2024 PCP: Abelino Hof, FNP    Brief Narrative:   Candace Middleton is a 62 y.o. female with past medical history significant for COPD in which she is not oxygen dependent, HLD, tobacco use disorder who presented to [facility] ED on 04/14/2024 with 2-week history of progressive shortness of breath, cough, fever/chills.  Recently diagnosed with right lower lobe pneumonia and completed 1 week course of Augmentin  and azithromycin .  Despite compliance with antibiotics, she reported no improvement in her symptoms; thus prompting further evaluation in the ED.  In the ED, temperature 100.3 F, HR 99, RR 26, BP 107/60, SpO2 93% on 4 L nasal cannula.  WBC 29.6, hemoglobin 15.2, platelet count 323.  Sodium 133, potassium 3.9, chloride 100, CO2 22, glucose 96, BUN 9, creatinine 0.73.  Magnesium 1.7.  Procalcitonin 0.15.  COVID/influenza/RSV PCR negative.  Chest x-ray with right lower lobe pneumonia superimposed on a background of COPD.  CT chest without contrast with right lower and middle lobe bronchopneumonia superimposed on advanced COPD, debris's in the right mainstem bronchus with mucous plugging in the lower lobes and right middle lobe query aspiration, acute mildly displaced fracture of the posterior left 11th rib, aortic atherosclerosis.  Blood cultures x 2 obtained.  Patient was administered acetaminophen  650 mg p.o. x 1 dose, Rocephin , doxycycline , normal saline x 1 L bolus.  TRH was consulted for admission for further evaluation management of acute hypoxic respite failure secondary to community-acquired pneumonia, COPD exacerbation.    Assessment & Plan:   Acute hypoxic respiratory failure, POA due to right sided streptococcal pneumonia pneumonia.  CT chest suspicious for aspiration, long with mild COPD exacerbation.  She is positive for strep pneumonia urinary antigen, CT chest positive for  right-sided disease with some evidence of possible ongoing aspiration, she has been placed on appropriate antibiotics, continue supplemental oxygen and nebulizer treatments, also on a trial of steroid for COPD exacerbation.  Encouraged to sit in chair use I-S and flutter valve pulmonary toiletry, speech therapy evaluation.  Still has significant leukocytosis but clinically improving continue to monitor.  Blood cultures noted, question contamination, ID following.   SpO2: 96 % O2 Flow Rate (L/min): 2 L/min  COPD exacerbation  -- Breztri  2 puffs BID, trial of steroids, minimal wheezing, nebulizer treatments as needed.  Advance activity titrate on oxygen, still smoking counseled to quit.  Ongoing smoking.  Counseled to quit.    Essential hypertension  -- Holding home amlodipine  for now, blood pressure soft will monitor closely.   Hyperlipidemia  -- Atorvastatin  20 mg p.o. daily   DVT prophylaxis: enoxaparin  (LOVENOX ) injection 30 mg Start: 04/16/24 1000 SCDs Start: 04/14/24 2136    Code Status: Full Code Family Communication: No family present at bedside this morning  Disposition Plan:  Level of care: Progressive Status is: Inpatient Remains inpatient appropriate because: I have antibiotics, needs further weaning from oxygen, anticipate discharge home in 1-2 days    Consultants:  ID  Procedures:  None  Subjective:   Patient in bed, appears comfortable, denies any headache, no fever, no chest pain or pressure, no shortness of breath , no abdominal pain. No new focal weakness.   Objective: Vitals:   04/17/24 0034 04/17/24 0500 04/17/24 0546 04/17/24 0756  BP: 110/64  121/69 114/60  Pulse: 65  (!) 56 66  Resp:      Temp: 98.3 F (36.8 C)  (!) 97.2 F (36.2 C)  97.6 F (36.4 C)  TempSrc: Oral  Oral Oral  SpO2: 97%  98% 96%  Weight:  36.7 kg    Height:        Intake/Output Summary (Last 24 hours) at 04/17/2024 0928 Last data filed at 04/17/2024 0400 Gross per 24 hour   Intake 283.33 ml  Output --  Net 283.33 ml   Filed Weights   04/14/24 1900 04/16/24 0500 04/17/24 0500  Weight: 34.9 kg 36.4 kg 36.7 kg    Physical Exam:  Awake Alert, No new F.N deficits, Normal affect [place].AT,PERRAL Supple Neck, No JVD,   Symmetrical Chest wall movement, reduced right lower breath sounds with some coarse crackles in the right base RRR,No Gallops, Rubs or new Murmurs,  +ve B.Sounds, Abd Soft, No tenderness,   No Cyanosis, Clubbing or edema     Data Reviewed: I have personally reviewed following labs and imaging studies   Data Review:   Inpatient Medications  Scheduled Meds:  aspirin  EC  81 mg Oral Daily   atorvastatin   20 mg Oral QPM   budeson-glycopyrrolate -formoterol   2 puff Inhalation BID   enoxaparin  (LOVENOX ) injection  30 mg Subcutaneous Q24H   guaiFENesin   600 mg Oral BID   metoprolol  succinate  12.5 mg Oral Daily   predniSONE   40 mg Oral Q breakfast   Continuous Infusions:  cefTRIAXone  (ROCEPHIN )  IV Stopped (04/16/24 1743)   PRN Meds:.acetaminophen  **OR** acetaminophen , albuterol , benzonatate , melatonin, nicotine , ondansetron  (ZOFRAN ) IV   Recent Labs  Lab 04/14/24 1904 04/15/24 0536 04/17/24 0454  WBC 29.6* 33.0* 15.0*  HGB 15.2* 13.3 12.5  HCT 44.9 40.0 39.8  PLT 323 269 213  MCV 93.2 94.6 99.0  MCH 31.5 31.4 31.1  MCHC 33.9 33.3 31.4  RDW 13.4 13.5 14.0  LYMPHSABS  --  1.0 2.9  MONOABS  --  0.8 1.1*  EOSABS  --  0.0 0.0  BASOSABS  --  0.1 0.0    Recent Labs  Lab 04/14/24 1904 04/14/24 2118 04/15/24 0536 04/15/24 0537 04/16/24 0520 04/16/24 0521 04/17/24 0454  NA 133*  --  136  --   --   --  137  K 3.9  --  4.0  --   --   --  4.4  CL 100  --  105  --   --   --  109  CO2 22  --  22  --   --   --  19*  ANIONGAP 11  --  9  --   --   --  9  GLUCOSE 96  --  165*  --   --   --  143*  BUN 9  --  11  --   --   --  10  CREATININE 0.73  --  0.67  --   --   --  0.53  AST  --   --  15  --   --   --   --   ALT  --   --  15   --   --   --   --   ALKPHOS  --   --  52  --   --   --   --   BILITOT  --   --  0.8  --   --   --   --   ALBUMIN  --   --  2.4*  --   --   --   --   CRP  --   --   --   --  6.3*  --  1.8*  PROCALCITON 0.15  --   --   --  0.46  --  0.24  LATICACIDVEN  --  1.0  --   --   --   --   --   BNP  --   --   --  184.9*  --  219.6* 257.9*  MG 1.7  --  1.7  --   --   --  1.8  PHOS  --   --  4.1  --   --   --   --   CALCIUM  8.7*  --  7.9*  --   --   --  8.1*      Recent Labs  Lab 04/14/24 1904 04/14/24 2118 04/15/24 0536 04/15/24 0537 04/16/24 0520 04/16/24 0521 04/17/24 0454  CRP  --   --   --   --  6.3*  --  1.8*  PROCALCITON 0.15  --   --   --  0.46  --  0.24  LATICACIDVEN  --  1.0  --   --   --   --   --   BNP  --   --   --  184.9*  --  219.6* 257.9*  MG 1.7  --  1.7  --   --   --  1.8  CALCIUM  8.7*  --  7.9*  --   --   --  8.1*    --------------------------------------------------------------------------------------------------------------- Lab Results  Component Value Date   CHOL 181 09/25/2018   HDL 68 09/25/2018   LDLCALC 97 09/25/2018   TRIG 78 09/25/2018   CHOLHDL 2.7 09/25/2018    No results found for: "HGBA1C" No results for input(s): "TSH", "T4TOTAL", "FREET4", "T3FREE", "THYROIDAB" in the last 72 hours. No results for input(s): "VITAMINB12", "FOLATE", "FERRITIN", "TIBC", "IRON", "RETICCTPCT" in the last 72 hours. ------------------------------------------------------------------------------------------------------------------ Cardiac Enzymes No results for input(s): "CKMB", "TROPONINI", "MYOGLOBIN" in the last 168 hours.  Invalid input(s): "CK"  Micro Results Recent Results (from the past 240 hours)  Resp panel by RT-PCR (RSV, Flu A&B, Covid) Anterior Nasal Swab     Status: None   Collection Time: 04/14/24  7:28 PM   Specimen: Anterior Nasal Swab  Result Value Ref Range Status   SARS Coronavirus 2 by RT PCR NEGATIVE NEGATIVE Final   Influenza A by PCR  NEGATIVE NEGATIVE Final   Influenza B by PCR NEGATIVE NEGATIVE Final    Comment: (NOTE) The Xpert Xpress SARS-CoV-2/FLU/RSV plus assay is intended as an aid in the diagnosis of influenza from Nasopharyngeal swab specimens and should not be used as a sole basis for treatment. Nasal washings and aspirates are unacceptable for Xpert Xpress SARS-CoV-2/FLU/RSV testing.  Fact Sheet for Patients: BloggerCourse.com  Fact Sheet for Healthcare Providers: SeriousBroker.it  This test is not yet approved or cleared by the [place]  FDA and has been authorized for detection and/or diagnosis of SARS-CoV-2 by FDA under an Emergency Use Authorization (EUA). This EUA will remain in effect (meaning this test can be used) for the duration of the COVID-19 declaration under Section 564(b)(1) of the Act, 21 U.S.C. section 360bbb-3(b)(1), unless the authorization is terminated or revoked.     Resp Syncytial Virus by PCR NEGATIVE NEGATIVE Final    Comment: (NOTE) Fact Sheet for Patients: BloggerCourse.com  Fact Sheet for Healthcare Providers: SeriousBroker.it  This test is not yet approved or cleared by the [place]  FDA and has been authorized for detection and/or diagnosis of SARS-CoV-2 by FDA under an  Emergency Use Authorization (EUA). This EUA will remain in effect (meaning this test can be used) for the duration of the COVID-19 declaration under Section 564(b)(1) of the Act, 21 U.S.C. section 360bbb-3(b)(1), unless the authorization is terminated or revoked.  Performed at [facility], [address], [place], [place] [address]   Blood culture (routine x 2)     Status: None (Preliminary result)   Collection Time: 04/14/24  8:39 PM   Specimen: BLOOD  Result Value Ref Range Status   Specimen Description BLOOD RIGHT ANTECUBITAL  Final   Special Requests   Final    BOTTLES DRAWN  AEROBIC AND ANAEROBIC Blood Culture adequate volume   Culture  Setup Time   Final    GRAM POSITIVE COCCI IN CLUSTERS ANAEROBIC BOTTLE ONLY CRITICAL RESULT CALLED TO, READ BACK BY AND VERIFIED WITH: PHARMD JESSICA MILLEN ON 04/15/24 @ 2010 BY DRT Performed at [facility], [address], [place], [place] [address]    Culture GRAM POSITIVE COCCI  Final   Report Status PENDING  Incomplete  Blood Culture ID Panel (Reflexed)     Status: Abnormal   Collection Time: 04/14/24  8:39 PM  Result Value Ref Range Status   Enterococcus faecalis NOT DETECTED NOT DETECTED Final   Enterococcus Faecium NOT DETECTED NOT DETECTED Final   Listeria monocytogenes NOT DETECTED NOT DETECTED Final   Staphylococcus species DETECTED (A) NOT DETECTED Final    Comment: CRITICAL RESULT CALLED TO, READ BACK BY AND VERIFIED WITH: PHARMD JESSICA MILLEN ON 04/15/24 @ 2010 BY DRT    Staphylococcus aureus (BCID) NOT DETECTED NOT DETECTED Final   Staphylococcus epidermidis DETECTED (A) NOT DETECTED Final    Comment: CRITICAL RESULT CALLED TO, READ BACK BY AND VERIFIED WITH: PHARMD JESSICA MILLEN ON 04/15/24 @ 2010 BY DRT    Staphylococcus lugdunensis NOT DETECTED NOT DETECTED Final   Streptococcus species NOT DETECTED NOT DETECTED Final   Streptococcus agalactiae NOT DETECTED NOT DETECTED Final   Streptococcus pneumoniae NOT DETECTED NOT DETECTED Final   Streptococcus pyogenes NOT DETECTED NOT DETECTED Final   A.calcoaceticus-baumannii NOT DETECTED NOT DETECTED Final   Bacteroides fragilis NOT DETECTED NOT DETECTED Final   Enterobacterales NOT DETECTED NOT DETECTED Final   Enterobacter cloacae complex NOT DETECTED NOT DETECTED Final   Escherichia coli NOT DETECTED NOT DETECTED Final   Klebsiella aerogenes NOT DETECTED NOT DETECTED Final   Klebsiella oxytoca NOT DETECTED NOT DETECTED Final   Klebsiella pneumoniae NOT DETECTED NOT DETECTED Final   Proteus species NOT DETECTED NOT DETECTED Final   Salmonella  species NOT DETECTED NOT DETECTED Final   Serratia marcescens NOT DETECTED NOT DETECTED Final   Haemophilus influenzae NOT DETECTED NOT DETECTED Final   Neisseria meningitidis NOT DETECTED NOT DETECTED Final   Pseudomonas aeruginosa NOT DETECTED NOT DETECTED Final   Stenotrophomonas maltophilia NOT DETECTED NOT DETECTED Final   Candida albicans NOT DETECTED NOT DETECTED Final   Candida auris NOT DETECTED NOT DETECTED Final   Candida glabrata NOT DETECTED NOT DETECTED Final   Candida krusei NOT DETECTED NOT DETECTED Final   Candida parapsilosis NOT DETECTED NOT DETECTED Final   Candida tropicalis NOT DETECTED NOT DETECTED Final   Cryptococcus neoformans/gattii NOT DETECTED NOT DETECTED Final   Methicillin resistance mecA/C NOT DETECTED NOT DETECTED Final    Comment: Performed at [facility], [address], [place], [place] [address]  Blood culture (routine x 2)     Status: None (Preliminary result)   Collection  Time: 04/14/24  8:44 PM   Specimen: BLOOD  Result Value Ref Range Status   Specimen Description BLOOD LEFT ANTECUBITAL  Final   Special Requests   Final    BOTTLES DRAWN AEROBIC AND ANAEROBIC Blood Culture adequate volume   Culture  Setup Time   Final    GRAM POSITIVE COCCI IN CLUSTERS BOTTLES DRAWN AEROBIC ONLY CRITICAL VALUE NOTED.  VALUE IS CONSISTENT WITH PREVIOUSLY REPORTED AND CALLED VALUE. Performed at [facility], [address], [place], [place] [address]    Culture GRAM POSITIVE COCCI  Final   Report Status PENDING  Incomplete  MRSA Next Gen by PCR, Nasal     Status: None   Collection Time: 04/16/24  5:42 AM   Specimen: Nasal Mucosa; Nasal Swab  Result Value Ref Range Status   MRSA by PCR Next Gen NOT DETECTED NOT DETECTED Final    Comment: (NOTE) The GeneXpert MRSA Assay (FDA approved for NASAL specimens only), is one component of a comprehensive MRSA colonization surveillance program. It is not intended to diagnose MRSA infection nor to guide or  monitor treatment for MRSA infections. Test performance is not FDA approved in patients less than 67 years old. Performed at [facility], [address] [place], [place] [address]     Radiology Reports  No results found.     Signature  -   Lynnwood Sauer M.D on 04/17/2024 at 9:28 AM   -  To page go to [email or web address]

## 2024-04-17 NOTE — Progress Notes (Addendum)
   04/17/24 1617  Mobility  Activity Ambulated independently in hallway  Level of Assistance Standby assist, set-up cues, supervision of patient - no hands on  Assistive Device None  Distance Ambulated (ft) 700 ft  Activity Response Tolerated fair  Mobility Referral Yes  Mobility visit 1 Mobility  Mobility Specialist Start Time (ACUTE ONLY) 1617  Mobility Specialist Stop Time (ACUTE ONLY) 1630  Mobility Specialist Time Calculation (min) (ACUTE ONLY) 13 min   Mobility Specialist: Progress Note  During Mobility:  SpO2 89-96% RA Post-Mobility:     SpO2 95% RA  Pt agreeable to mobility session - received in chair. C/o slight SOB and pain in L ribs 5/10 when walking, VSS. Pt states she is feeling much better. Returned to chair with all needs met - call bell within reach.   Isla Mari, BS Mobility Specialist Please contact via SecureChat or  Rehab office at [phone].

## 2024-04-17 NOTE — Plan of Care (Signed)

## 2024-04-17 NOTE — Progress Notes (Signed)
 ID PROGRESS NOTE  Micro update shows rothia in 1 blood cx, and staph epi in 1 bottle of 2nd blood cx set. This is likely contaminant as patient is not immunocompromised, only found in 1 bottle, and no central lines  Recommend to continue to treat for strep pneumoniae pneumonia.   Leukocytosis improving.  Will sign off.  Gerold Kos Levern Reader MD MPH [facility] [phone]

## 2024-04-17 NOTE — Care Management Important Message (Signed)
 Important Message  Patient Details  Name: Candace Middleton MRN: [id number] Date of Birth: [birth year]   Important Message Given:  Yes - Medicare IM     Wynonia Hedges 04/17/2024, 12:22 PM

## 2024-04-18 ENCOUNTER — Other Ambulatory Visit (HOSPITAL_COMMUNITY): Payer: Self-pay

## 2024-04-18 DIAGNOSIS — J189 Pneumonia, unspecified organism: Secondary | ICD-10-CM | POA: Diagnosis not present

## 2024-04-18 LAB — MAGNESIUM: Magnesium: 1.9 mg/dL (ref 1.7–2.4)

## 2024-04-18 LAB — BASIC METABOLIC PANEL WITH GFR
Anion gap: 9 (ref 5–15)
BUN: 16 mg/dL (ref 8–23)
CO2: 24 mmol/L (ref 22–32)
Calcium: 8.3 mg/dL — ABNORMAL LOW (ref 8.9–10.3)
Chloride: 105 mmol/L (ref 98–111)
Creatinine, Ser: 0.71 mg/dL (ref 0.44–1.00)
GFR, Estimated: >60 mL/min (ref >60)
Glucose, Bld: 116 mg/dL — ABNORMAL HIGH (ref 70–99)
Potassium: 4.6 mmol/L (ref 3.5–5.1)
Sodium: 138 mmol/L (ref 135–145)

## 2024-04-18 LAB — CBC WITH DIFFERENTIAL/PLATELET
Abs Immature Granulocytes: 0.04 10*3/uL (ref 0.00–0.07)
Basophils Absolute: 0 10*3/uL (ref 0.0–0.1)
Basophils Relative: 0 %
Eosinophils Absolute: 0.1 10*3/uL (ref 0.0–0.5)
Eosinophils Relative: 1 %
HCT: 39 % (ref 36.0–46.0)
Hemoglobin: 12.7 g/dL (ref 12.0–15.0)
Immature Granulocytes: 0 %
Lymphocytes Relative: 30 %
Lymphs Abs: 3.5 10*3/uL (ref 0.7–4.0)
MCH: 31.1 pg (ref 26.0–34.0)
MCHC: 32.6 g/dL (ref 30.0–36.0)
MCV: 95.6 fL (ref 80.0–100.0)
Monocytes Absolute: 0.9 10*3/uL (ref 0.1–1.0)
Monocytes Relative: 8 %
Neutro Abs: 7.1 10*3/uL (ref 1.7–7.7)
Neutrophils Relative %: 61 %
Platelets: 326 10*3/uL (ref 150–400)
RBC: 4.08 MIL/uL (ref 3.87–5.11)
RDW: 13.6 % (ref 11.5–15.5)
WBC: 11.6 10*3/uL — ABNORMAL HIGH (ref 4.0–10.5)
nRBC: 0 % (ref 0.0–0.2)

## 2024-04-18 LAB — PROCALCITONIN: Procalcitonin: <0.1 ng/mL

## 2024-04-18 LAB — BRAIN NATRIURETIC PEPTIDE: B Natriuretic Peptide: 196.4 pg/mL — ABNORMAL HIGH (ref 0.0–100.0)

## 2024-04-18 LAB — C-REACTIVE PROTEIN: CRP: 1.1 mg/dL — ABNORMAL HIGH (ref <1.0)

## 2024-04-18 MED ORDER — METHYLPREDNISOLONE 4 MG PO TBPK
ORAL_TABLET | ORAL | 0 refills | Status: DC
  Filled 2024-04-18: qty 21, 6d supply, fill #0

## 2024-04-18 MED ORDER — CEPHALEXIN 500 MG PO CAPS
500.0000 mg | ORAL_CAPSULE | Freq: Three times a day (TID) | ORAL | 0 refills | Status: AC
Start: 2024-04-18 — End: 2024-04-24
  Filled 2024-04-18: qty 18, 6d supply, fill #0

## 2024-04-18 MED ORDER — AZITHROMYCIN 500 MG PO TABS
500.0000 mg | ORAL_TABLET | Freq: Every day | ORAL | 0 refills | Status: DC
Start: 2024-04-18 — End: 2024-04-18
  Filled 2024-04-18: qty 6, 6d supply, fill #0

## 2024-04-18 MED ORDER — NICOTINE 14 MG/24HR TD PT24
14.0000 mg | MEDICATED_PATCH | Freq: Every day | TRANSDERMAL | 0 refills | Status: DC | PRN
  Filled 2024-04-18: qty 28, 28d supply, fill #0

## 2024-04-18 NOTE — Discharge Summary (Addendum)
 Candace Middleton:[id number] DOB: April 07, 1961 DOA: 04/14/2024  PCP: Abelino Hof, FNP  Admit date: 04/14/2024  Discharge date: 04/18/2024  Admitted From: Home   Disposition:  Home   Recommendations for Outpatient Follow-up:   Follow up with PCP in 1-2 weeks  PCP Please obtain BMP/CBC, 2 view CXR in 1week,  (see Discharge instructions)   PCP Please follow up on the following pending results:    Home Health: None   Equipment/Devices: None  Consultations: ID Discharge Condition: Stable    CODE STATUS: Full    Diet Recommendation: Heart Healthy     Chief Complaint  Patient presents with   hypoxia     Brief history of present illness from the day of admission and additional interim summary    63 y.o. female with past medical history significant for COPD in which she is not oxygen dependent, HLD, tobacco use disorder who presented to [facility] ED on 04/14/2024 with 2-week history of progressive shortness of breath, cough, fever/chills.  Recently diagnosed with right lower lobe pneumonia and completed 1 week course of Augmentin  and azithromycin .  Despite compliance with antibiotics, she reported no improvement in her symptoms; thus prompting further evaluation in the ED.   In the ED, temperature 100.3 F, HR 99, RR 26, BP 107/60, SpO2 93% on 4 L nasal cannula.  WBC 29.6, hemoglobin 15.2, platelet count 323.  Sodium 133, potassium 3.9, chloride 100, CO2 22, glucose 96, BUN 9, creatinine 0.73.  Magnesium 1.7.  Procalcitonin 0.15.  COVID/influenza/RSV PCR negative.  Chest x-ray with right lower lobe pneumonia superimposed on a background of COPD.  CT chest without contrast with right lower and middle lobe bronchopneumonia superimposed on advanced COPD, debris's in the right mainstem bronchus with  mucous plugging in the lower lobes and right middle lobe query aspiration, acute mildly displaced fracture of the posterior left 11th rib, aortic atherosclerosis.  Blood cultures x 2 obtained.  Patient was administered acetaminophen  650 mg p.o. x 1 dose, Rocephin , doxycycline , normal saline x 1 L bolus.  TRH was consulted for admission for further evaluation management of acute hypoxic respite failure secondary to community-acquired pneumonia, COPD exacerbation.                                                                 Hospital Course   Acute hypoxic respiratory failure, POA due to right sided streptococcal pneumonia pneumonia.  CT chest suspicious for aspiration, long with mild COPD exacerbation.   She is positive for strep pneumonia urinary antigen, CT chest positive for right-sided disease with some evidence of possible ongoing aspiration, she has been placed on appropriate antibiotics, continue supplemental oxygen and nebulizer treatments, also on a trial of steroid for COPD exacerbation.  Supportive care she is back to her baseline, completely symptom-free  on room air, eager to go home will get 6 more days of oral Keflex  will be discharged home with PCP monitoring.  Was also seen by ID, blood cultures suggestive of contamination.  Seen and cleared by speech therapy as well.   COPD exacerbation  -- Breztri  2 puffs BID, trial of steroids, minimal wheezing, nebulizer treatments as needed.  Symptom-free and on room air, will get short course of oral steroid taper, still smoking counseled to quit.   Ongoing smoking.  Counseled to quit.     Essential hypertension  --will continue home regimen.   Hyperlipidemia  -- Atorvastatin  20 mg p.o. daily    Discharge diagnosis     Principal Problem:   CAP (community acquired pneumonia) Active Problems:   HLD (hyperlipidemia)   Tobacco abuse   COPD with acute exacerbation (HCC)   SOB (shortness of breath)   Severe sepsis (HCC)   Leukocytosis    Acute hypoxic respiratory failure (HCC)    Discharge instructions    Discharge Instructions     Diet - low sodium heart healthy   Complete by: As directed    Discharge instructions   Complete by: As directed    Follow with Primary MD Alen Husbands, Urbano Gardener, FNP in 7 days   Get CBC, CMP, 2 view Chest X ray -  checked next visit with your primary MD   Activity: As tolerated with Full fall precautions use walker/cane & assistance as needed  Disposition Home   Diet: Heart Healthy   Special Instructions: If you have smoked or chewed Tobacco  in the last 2 yrs please stop smoking, stop any regular Alcohol   and or any Recreational drug use.  On your next visit with your primary care physician please Get Medicines reviewed and adjusted.  Please request your Prim.MD to go over all Hospital Tests and Procedure/Radiological results at the follow up, please get all Hospital records sent to your Prim MD by signing hospital release before you go home.  If you experience worsening of your admission symptoms, develop shortness of breath, life threatening emergency, suicidal or homicidal thoughts you must seek medical attention immediately by calling 911 or calling your MD immediately  if symptoms less severe.  You Must read complete instructions/literature along with all the possible adverse reactions/side effects for all the Medicines you take and that have been prescribed to you. Take any new Medicines after you have completely understood and accpet all the possible adverse reactions/side effects.   Do not drive when taking Pain medications.  Do not take more than prescribed Pain, Sleep and Anxiety Medications  Wear Seat belts while driving.   Increase activity slowly   Complete by: As directed        Discharge Medications   Allergies as of 04/18/2024       Reactions   Gabapentin  Hives   Patient reports facial swelling, hives, facial involvement    Sulfa Antibiotics Anaphylaxis    Onset at age 38, "almost died because I stopped breathing."        Medication List     STOP taking these medications    doxycycline  100 MG capsule Commonly known as: MONODOX        TAKE these medications    albuterol  108 (90 Base) MCG/ACT inhaler Commonly known as: VENTOLIN  HFA Inhale 2 puffs into the lungs every 6 (six) hours as needed.   albuterol  (2.5 MG/3ML) 0.083% nebulizer solution Commonly known as: PROVENTIL  Take 3 mLs (2.5 mg total) by nebulization every  6 (six) hours as needed for wheezing or shortness of breath.   amLODipine  5 MG tablet Commonly known as: NORVASC  Take 5 mg by mouth at bedtime.   aspirin  EC 81 MG tablet Take 1 tablet (81 mg total) by mouth daily.   atorvastatin  20 MG tablet Commonly known as: LIPITOR TAKE 1 TABLET BY MOUTH EVERY DAY What changed: when to take this   budeson-glycopyrrolate -formoterol  160-9-4.8 MCG/ACT Aero inhaler Commonly known as: BREZTRI  Inhale 2 puffs into the lungs 2 (two) times daily.   cephALEXin  500 MG capsule Commonly known as: KEFLEX  Take 1 capsule (500 mg total) by mouth 3 (three) times daily for 6 days.   methylPREDNISolone  4 MG Tbpk tablet Commonly known as: MEDROL  DOSEPAK follow package directions   metoprolol  succinate 25 MG 24 hr tablet Commonly known as: TOPROL -XL TAKE 1/2 TABLET(12.5 MG) BY MOUTH EVERY MORNING   nicotine  14 mg/24hr patch Commonly known as: NICODERM CQ  - dosed in mg/24 hours Place 1 patch (14 mg total) onto the skin daily as needed (smoking cessation).         Follow-up Information     Alen Husbands, Urbano Gardener, FNP. Schedule an appointment as soon as possible for a visit in 1 week(s).   Specialty: Nurse Practitioner Contact information: [address] [place] [address] [phone]                 Major procedures and Radiology Reports - PLEASE review detailed and final reports thoroughly  -      DG ESOPHAGUS W SINGLE CM (SOL OR THIN BA) Result Date:  04/17/2024 CLINICAL DATA:  Inpatient with a history of cough and pneumonia EXAM: ESOPHAGUS/BARIUM SWALLOW/TABLET STUDY TECHNIQUE: Combined double and single contrast examination was performed using effervescent crystals, high-density barium, and thin liquid barium. This exam was performed by Jetta Morrow NP, and was supervised and interpreted by Dr. Jinx Mourning. FLUOROSCOPY: Radiation Exposure Index (as provided by the fluoroscopic device): 3.6 mGy Kerma COMPARISON:  DG Swallow Function 04/17/24 FINDINGS: Swallowing: Appears normal. No vestibular penetration or aspiration seen. Pharynx: Unremarkable. Esophagus: Normal appearance. Esophageal motility: Mild dysmotility Hiatal Hernia: None. Gastroesophageal reflux: None visualized. Ingested 13mm barium tablet: Passed normally Other: None. IMPRESSION: Mild dysmotility.  Otherwise normal barium study. Electronically Signed   By: Creasie Doctor M.D.   On: 04/17/2024 12:38   DG Swallowing Func-Speech Pathology Result Date: 04/17/2024 Table formatting from the original result was not included. Modified Barium Swallow Study Patient Details Name: LYFE REIHL MRN: [id number] Date of Birth: [birth year] Today's Date: 04/17/2024 HPI/PMH: HPI: TRINETTA ALEMU is a 63 yo female presenting ot ED 4/21 with SOB. Workup revealed severe sepsis due to PNA. CT Chest shows R lower and middle lobe bronchopneumonia superimposed on advanced COPD and debris in the R mainstem bronchus with mucous plugging in the lower lobes and R middle lobe. PMH includes tobacco use, COPD, CAD, HLD Clinical Impression: Clinical Impression: Pt presents with a functional oropharyngeal swallow. No penetration/aspiration occurred throughout the study. There is minimal vallecular and pyriform sinus residue, which is considered functional. An esophagram was completed immediately following this MBS, so the barium tablet was deferred. Continue pt's regular diet with thin liquids. Ongoing SLP f/u is not  indicated at this time, will sign off. Factors that may increase risk of adverse event in presence of aspiration Roderick Civatte & Jessy Morocco 2021): Factors that may increase risk of adverse event in presence of aspiration Roderick Civatte & Jessy Morocco 2021): Respiratory or GI disease Recommendations/Plan: Swallowing Evaluation Recommendations Swallowing  Evaluation Recommendations Recommendations: PO diet PO Diet Recommendation: Regular; Thin liquids (Level 0) Liquid Administration via: Cup; Straw Medication Administration: Whole meds with liquid Supervision: Patient able to self-feed Swallowing strategies  : Slow rate; Small bites/sips Postural changes: Position pt fully upright for meals; Stay upright 30-60 min after meals Oral care recommendations: Oral care BID (2x/day) Treatment Plan Treatment Plan Treatment recommendations: Therapy as outlined in treatment plan below Follow-up recommendations: No SLP follow up Recommendations Recommendations for follow up therapy are one component of a multi-disciplinary discharge planning process, led by the attending physician.  Recommendations may be updated based on patient status, additional functional criteria and insurance authorization. Assessment: Orofacial Exam: Orofacial Exam Oral Cavity: Oral Hygiene: WFL Oral Cavity - Dentition: Adequate natural dentition Orofacial Anatomy: WFL Oral Motor/Sensory Function: WFL Anatomy: Anatomy: Suspected cervical osteophytes Boluses Administered: Boluses Administered Boluses Administered: Thin liquids (Level 0); Mildly thick liquids (Level 2, nectar thick); Moderately thick liquids (Level 3, honey thick); Puree; Solid  Oral Impairment Domain: Oral Impairment Domain Lip Closure: No labial escape Tongue control during bolus hold: Cohesive bolus between tongue to palatal seal Bolus preparation/mastication: Timely and efficient chewing and mashing Bolus transport/lingual motion: Brisk tongue motion Oral residue: Complete oral clearance Location of oral  residue : N/A Initiation of pharyngeal swallow : Valleculae  Pharyngeal Impairment Domain: Pharyngeal Impairment Domain Soft palate elevation: No bolus between soft palate (SP)/pharyngeal wall (PW) Laryngeal elevation: Complete superior movement of thyroid  cartilage with complete approximation of arytenoids to epiglottic petiole Anterior hyoid excursion: Complete anterior movement Epiglottic movement: Complete inversion Laryngeal vestibule closure: Complete, no air/contrast in laryngeal vestibule Pharyngeal stripping wave : Present - complete Pharyngeal contraction (A/P view only): N/A Pharyngoesophageal segment opening: Complete distension and complete duration, no obstruction of flow Tongue base retraction: Trace column of contrast or air between tongue base and PPW Pharyngeal residue: Trace residue within or on pharyngeal structures Location of pharyngeal residue: Valleculae; Pyriform sinuses  Esophageal Impairment Domain: No data recorded Pill: No data recorded Penetration/Aspiration Scale Score: Penetration/Aspiration Scale Score 1.  Material does not enter airway: Thin liquids (Level 0); Mildly thick liquids (Level 2, nectar thick); Moderately thick liquids (Level 3, honey thick); Puree; Solid Compensatory Strategies: Compensatory Strategies Compensatory strategies: Yes Straw: Effective Effective Straw: Thin liquid (Level 0); Mildly thick liquid (Level 2, nectar thick)   General Information: Caregiver present: No  Diet Prior to this Study: Regular; Thin liquids (Level 0)   Temperature : Normal   Respiratory Status: WFL   Supplemental O2: None (Room air)   History of Recent Intubation: No  Behavior/Cognition: Alert; Cooperative; Pleasant mood Self-Feeding Abilities: Able to self-feed Baseline vocal quality/speech: Normal Volitional Cough: Able to elicit Volitional Swallow: Able to elicit Exam Limitations: No limitations Goal Planning: Prognosis for improved oropharyngeal function: Good No data recorded No data  recorded Patient/Family Stated Goal: none stated Consulted and agree with results and recommendations: Patient Pain: Pain Assessment Pain Assessment: Faces Pain Score: 8 Faces Pain Scale: 6 Pain Location: L rib Pain Descriptors / Indicators: Discomfort Pain Intervention(s): Monitored during session End of Session: Start Time:SLP Start Time (ACUTE ONLY): 6045 Stop Time: SLP Stop Time (ACUTE ONLY): 4098 Time Calculation:SLP Time Calculation (min) (ACUTE ONLY): 10 min Charges: SLP Evaluations $ SLP Speech Visit: 1 Visit SLP Evaluations $BSS Swallow: 1 Procedure $MBS Swallow: 1 Procedure SLP visit diagnosis: SLP Visit Diagnosis: Dysphagia, oropharyngeal phase (R13.12) Past Medical History: Past Medical History: Diagnosis Date  Allergy   Anxiety   Arthritis   Cancer (HCC)  bladder ca  COPD (chronic obstructive pulmonary disease) (HCC)   Coronary artery disease   Depression   GERD (gastroesophageal reflux disease)   History of kidney stones   Hyperlipidemia   Neuromuscular disorder (HCC) 2014  RSD - nerve blocks in back for treatment   Osteoporosis 09/05/2019 Past Surgical History: Past Surgical History: Procedure Laterality Date  bladder cancer  2006  BLADDER TUMOR EXCISION  12/25/2004  BREAST SURGERY  "long time ago"  lumpectomy   CARDIAC CATHETERIZATION N/A 09/07/2015  Procedure: Left Heart Cath and Coronary Angiography;  Surgeon: Lucendia Rusk, MD;  Location: [facility] INVASIVE CV LAB;  Service: Cardiovascular;  Laterality: N/A;  CESAREAN SECTION  1982  CYSTOSCOPY WITH BIOPSY N/A 09/26/2023  Procedure: CYSTOSCOPY WITH BLADDER  BIOPSY;  Surgeon: Melody Spurling., MD;  Location: [facility] ORS;  Service: Urology;  Laterality: N/A;  45 MINS FOR CASE  CYSTOSCOPY/URETEROSCOPY/HOLMIUM LASER/STENT PLACEMENT Left 09/26/2023  Procedure: CYSTOSCOPY LEFT RETROGRADE PYELOGRAM/URETEROSCOPY/BASKETING OF STONE;  Surgeon: Melody Spurling., MD;  Location: [facility];  Service: Urology;  Laterality: Left;  ENDOMETRIAL ABLATION  12/25/1993  Amil Kale, M.A., CCC-SLP Speech Language Pathology, Acute Rehabilitation Services Secure Chat preferred [phone] 04/17/2024, 10:08 AM  CT Chest Wo Contrast Result Date: 04/14/2024 CLINICAL DATA:  Pneumonia complication suspected. History of COPD. Shortness of breath and hypoxia. EXAM: CT CHEST WITHOUT CONTRAST TECHNIQUE: Multidetector CT imaging of the chest was performed following the standard protocol without IV contrast. RADIATION DOSE REDUCTION: This exam was performed according to the departmental dose-optimization program which includes automated exposure control, adjustment of the mA and/or kV according to patient size and/or use of iterative reconstruction technique. COMPARISON:  Same day chest radiograph and CT chest 06/26/2022 FINDINGS: Cardiovascular: Normal heart size. No pericardial effusion. Mild aortic atherosclerotic calcification. Mediastinum/Nodes: Trachea is patent. Debris is present in the right mainstem bronchus. Unremarkable esophagus. No thoracic adenopathy noting limited sensitivity of noncontrast exam. Lungs/Pleura: Advanced emphysema. Diffuse bronchial wall thickening greatest in the lower lobes with scattered mucous plugs. Interlobular septal thickening and patchy airspace opacities in the right middle lobe and right lower lobe. No pleural effusion or pneumothorax. Upper Abdomen: No acute abnormality. Musculoskeletal: Acute mildly displaced fracture of the posterior left eleventh rib IMPRESSION: 1. Right lower and middle lobe bronchopneumonia superimposed on advanced COPD. 2. Debris in the right mainstem bronchus with mucous plugging in the lower lobes and right middle lobe. Query aspiration. 3. Acute mildly displaced fracture of the posterior left eleventh rib. Aortic Atherosclerosis (ICD10-I70.0) and Emphysema (ICD10-J43.9). Electronically Signed   By: Rozell Cornet M.D.   On: 04/14/2024 23:04   DG Chest Portable 1 View Result Date: 04/14/2024 CLINICAL DATA:  Shortness of  breath. Evaluation for left-sided pneumothorax. EXAM: PORTABLE CHEST 1 VIEW COMPARISON:  04/02/2024 FINDINGS: Hyperinflation and chronic bronchitic change. Airspace interstitial opacities in the right lower lung. No pleural effusion or pneumothorax. Stable cardiomediastinal silhouette. IMPRESSION: 1. Right lower lung pneumonia superimposed on a background of COPD. Electronically Signed   By: Rozell Cornet M.D.   On: 04/14/2024 22:58   DG Chest 2 View Result Date: 04/02/2024 CLINICAL DATA:  Cough, COPD exacerbation EXAM: CHEST - 2 VIEW COMPARISON:  Chest x-ray performed March 20, 2020 FINDINGS: A subtle focal opacity is present overlying the right costophrenic angle which is agitated. Lungs are hyperexpanded. Heart mediastinum are not significantly changed. IMPRESSION: A focal opacity is suspected in the right lung base which is suspicious for developing infiltrate. Electronically Signed   By: Vola Grow.D.  On: 04/02/2024 16:18    Micro Results    Recent Results (from the past 240 hours)  Resp panel by RT-PCR (RSV, Flu A&B, Covid) Anterior Nasal Swab     Status: None   Collection Time: 04/14/24  7:28 PM   Specimen: Anterior Nasal Swab  Result Value Ref Range Status   SARS Coronavirus 2 by RT PCR NEGATIVE NEGATIVE Final   Influenza A by PCR NEGATIVE NEGATIVE Final   Influenza B by PCR NEGATIVE NEGATIVE Final    Comment: (NOTE) The Xpert Xpress SARS-CoV-2/FLU/RSV plus assay is intended as an aid in the diagnosis of influenza from Nasopharyngeal swab specimens and should not be used as a sole basis for treatment. Nasal washings and aspirates are unacceptable for Xpert Xpress SARS-CoV-2/FLU/RSV testing.  Fact Sheet for Patients: BloggerCourse.com  Fact Sheet for Healthcare Providers: SeriousBroker.it  This test is not yet approved or cleared by the [place]  FDA and has been authorized for detection and/or diagnosis of SARS-CoV-2  by FDA under an Emergency Use Authorization (EUA). This EUA will remain in effect (meaning this test can be used) for the duration of the COVID-19 declaration under Section 564(b)(1) of the Act, 21 U.S.C. section 360bbb-3(b)(1), unless the authorization is terminated or revoked.     Resp Syncytial Virus by PCR NEGATIVE NEGATIVE Final    Comment: (NOTE) Fact Sheet for Patients: BloggerCourse.com  Fact Sheet for Healthcare Providers: SeriousBroker.it  This test is not yet approved or cleared by the [place]  FDA and has been authorized for detection and/or diagnosis of SARS-CoV-2 by FDA under an Emergency Use Authorization (EUA). This EUA will remain in effect (meaning this test can be used) for the duration of the COVID-19 declaration under Section 564(b)(1) of the Act, 21 U.S.C. section 360bbb-3(b)(1), unless the authorization is terminated or revoked.  Performed at [facility], [address], [place], [place] [address]   Blood culture (routine x 2)     Status: Abnormal   Collection Time: 04/14/24  8:39 PM   Specimen: BLOOD  Result Value Ref Range Status   Specimen Description BLOOD RIGHT ANTECUBITAL  Final   Special Requests   Final    BOTTLES DRAWN AEROBIC AND ANAEROBIC Blood Culture adequate volume   Culture  Setup Time   Final    GRAM POSITIVE COCCI IN CLUSTERS ANAEROBIC BOTTLE ONLY CRITICAL RESULT CALLED TO, READ BACK BY AND VERIFIED WITH: PHARMD JESSICA MILLEN ON 04/15/24 @ 2010 BY DRT    Culture (A)  Final    STAPHYLOCOCCUS EPIDERMIDIS THE SIGNIFICANCE OF ISOLATING THIS ORGANISM FROM A SINGLE SET OF BLOOD CULTURES WHEN MULTIPLE SETS ARE DRAWN IS UNCERTAIN. PLEASE NOTIFY THE MICROBIOLOGY DEPARTMENT WITHIN ONE WEEK IF SPECIATION AND SENSITIVITIES ARE REQUIRED. Performed at [facility], [address] [place], [place] [address]    Report Status 04/17/2024 FINAL  Final  Blood Culture ID Panel  (Reflexed)     Status: Abnormal   Collection Time: 04/14/24  8:39 PM  Result Value Ref Range Status   Enterococcus faecalis NOT DETECTED NOT DETECTED Final   Enterococcus Faecium NOT DETECTED NOT DETECTED Final   Listeria monocytogenes NOT DETECTED NOT DETECTED Final   Staphylococcus species DETECTED (A) NOT DETECTED Final    Comment: CRITICAL RESULT CALLED TO, READ BACK BY AND VERIFIED WITH: PHARMD JESSICA MILLEN ON 04/15/24 @ 2010 BY DRT    Staphylococcus aureus (BCID) NOT DETECTED NOT DETECTED Final   Staphylococcus epidermidis DETECTED (A) NOT DETECTED Final  Comment: CRITICAL RESULT CALLED TO, READ BACK BY AND VERIFIED WITH: PHARMD JESSICA MILLEN ON 04/15/24 @ 2010 BY DRT    Staphylococcus lugdunensis NOT DETECTED NOT DETECTED Final   Streptococcus species NOT DETECTED NOT DETECTED Final   Streptococcus agalactiae NOT DETECTED NOT DETECTED Final   Streptococcus pneumoniae NOT DETECTED NOT DETECTED Final   Streptococcus pyogenes NOT DETECTED NOT DETECTED Final   A.calcoaceticus-baumannii NOT DETECTED NOT DETECTED Final   Bacteroides fragilis NOT DETECTED NOT DETECTED Final   Enterobacterales NOT DETECTED NOT DETECTED Final   Enterobacter cloacae complex NOT DETECTED NOT DETECTED Final   Escherichia coli NOT DETECTED NOT DETECTED Final   Klebsiella aerogenes NOT DETECTED NOT DETECTED Final   Klebsiella oxytoca NOT DETECTED NOT DETECTED Final   Klebsiella pneumoniae NOT DETECTED NOT DETECTED Final   Proteus species NOT DETECTED NOT DETECTED Final   Salmonella species NOT DETECTED NOT DETECTED Final   Serratia marcescens NOT DETECTED NOT DETECTED Final   Haemophilus influenzae NOT DETECTED NOT DETECTED Final   Neisseria meningitidis NOT DETECTED NOT DETECTED Final   Pseudomonas aeruginosa NOT DETECTED NOT DETECTED Final   Stenotrophomonas maltophilia NOT DETECTED NOT DETECTED Final   Candida albicans NOT DETECTED NOT DETECTED Final   Candida auris NOT DETECTED NOT DETECTED Final    Candida glabrata NOT DETECTED NOT DETECTED Final   Candida krusei NOT DETECTED NOT DETECTED Final   Candida parapsilosis NOT DETECTED NOT DETECTED Final   Candida tropicalis NOT DETECTED NOT DETECTED Final   Cryptococcus neoformans/gattii NOT DETECTED NOT DETECTED Final   Methicillin resistance mecA/C NOT DETECTED NOT DETECTED Final    Comment: Performed at [facility], [address] [place], [place] [address]  Blood culture (routine x 2)     Status: Abnormal   Collection Time: 04/14/24  8:44 PM   Specimen: BLOOD  Result Value Ref Range Status   Specimen Description BLOOD LEFT ANTECUBITAL  Final   Special Requests   Final    BOTTLES DRAWN AEROBIC AND ANAEROBIC Blood Culture adequate volume   Culture  Setup Time   Final    GRAM POSITIVE COCCI IN CLUSTERS BOTTLES DRAWN AEROBIC ONLY CRITICAL VALUE NOTED.  VALUE IS CONSISTENT WITH PREVIOUSLY REPORTED AND CALLED VALUE.    Culture (A)  Final    ROTHIA MUCILAGINOSA Standardized susceptibility testing for this organism is not available. Performed at [facility], [address], [place], [place] [address]    Report Status 04/17/2024 FINAL  Final  MRSA Next Gen by PCR, Nasal     Status: None   Collection Time: 04/16/24  5:42 AM   Specimen: Nasal Mucosa; Nasal Swab  Result Value Ref Range Status   MRSA by PCR Next Gen NOT DETECTED NOT DETECTED Final    Comment: (NOTE) The GeneXpert MRSA Assay (FDA approved for NASAL specimens only), is one component of a comprehensive MRSA colonization surveillance program. It is not intended to diagnose MRSA infection nor to guide or monitor treatment for MRSA infections. Test performance is not FDA approved in patients less than 48 years old. Performed at [facility], [address] [place], [place] [address]     Today   Subjective    Chelsye Suhre today has no headache,no chest abdominal pain,no new weakness tingling or numbness, feels much better wants to go home  today.    Objective   Blood pressure 133/60, pulse 60, temperature 98.4 F (36.9 C), temperature source Oral, resp. rate 18, height 4\' 11"  (1.499  m), weight 34.6 kg, SpO2 98%.  No intake or output data in the 24 hours ending 04/18/24 8657  Exam  Awake Alert, No new F.N deficits,    John Day.AT,PERRAL Supple Neck,   Symmetrical Chest wall movement, Good air movement bilaterally, CTAB RRR,No Gallops,   +ve B.Sounds, Abd Soft, Non tender,  No Cyanosis, Clubbing or edema    Data Review   Recent Labs  Lab 04/14/24 1904 04/15/24 0536 04/17/24 0454 04/18/24 0430  WBC 29.6* 33.0* 15.0* 11.6*  HGB 15.2* 13.3 12.5 12.7  HCT 44.9 40.0 39.8 39.0  PLT 323 269 213 326  MCV 93.2 94.6 99.0 95.6  MCH 31.5 31.4 31.1 31.1  MCHC 33.9 33.3 31.4 32.6  RDW 13.4 13.5 14.0 13.6  LYMPHSABS  --  1.0 2.9 3.5  MONOABS  --  0.8 1.1* 0.9  EOSABS  --  0.0 0.0 0.1  BASOSABS  --  0.1 0.0 0.0    Recent Labs  Lab 04/14/24 1904 04/14/24 2118 04/15/24 0536 04/15/24 0537 04/16/24 0520 04/16/24 0521 04/17/24 0454 04/18/24 0430  NA 133*  --  136  --   --   --  137 138  K 3.9  --  4.0  --   --   --  4.4 4.6  CL 100  --  105  --   --   --  109 105  CO2 22  --  22  --   --   --  19* 24  ANIONGAP 11  --  9  --   --   --  9 9  GLUCOSE 96  --  165*  --   --   --  143* 116*  BUN 9  --  11  --   --   --  10 16  CREATININE 0.73  --  0.67  --   --   --  0.53 0.71  AST  --   --  15  --   --   --   --   --   ALT  --   --  15  --   --   --   --   --   ALKPHOS  --   --  52  --   --   --   --   --   BILITOT  --   --  0.8  --   --   --   --   --   ALBUMIN  --   --  2.4*  --   --   --   --   --   CRP  --   --   --   --  6.3*  --  1.8* 1.1*  PROCALCITON 0.15  --   --   --  0.46  --  0.24 <0.10  LATICACIDVEN  --  1.0  --   --   --   --   --   --   BNP  --   --   --  184.9*  --  219.6* 257.9* 196.4*  MG 1.7  --  1.7  --   --   --  1.8 1.9  PHOS  --   --  4.1  --   --   --   --   --   CALCIUM  8.7*  --  7.9*  --    --   --  8.1* 8.3*    Total Time in preparing paper work, data evaluation and  todays exam - 35 minutes  Signature  -    Lynnwood Sauer M.D on 04/18/2024 at 8:22 AM   -  To page go to [email or web address]

## 2024-04-18 NOTE — Plan of Care (Signed)

## 2024-04-18 NOTE — TOC Transition Note (Signed)
 Transition of Care [facility]) - Discharge Note   Patient Details  Name: Candace Middleton MRN: [id number] Date of Birth: [birth year]  [facility] CM/SW Contact:  Eusebio High, RN Phone Number: 04/18/2024, 8:51 AM   Clinical Narrative:     Patient will DC to home today. No TOC needs identified. Patient will drive self home.Follow up as directed on AVS          Patient Goals and CMS Choice            Discharge Placement                       Discharge Plan and Services Additional resources added to the After Visit Summary for                                       Social Drivers of Health (SDOH) Interventions SDOH Screenings   Food Insecurity: No Food Insecurity (04/15/2024)  Housing: Low Risk  (04/15/2024)  Transportation Needs: No Transportation Needs (04/15/2024)  Utilities: Not At Risk (04/15/2024)  Depression (PHQ2-9): Low Risk  (09/05/2019)  Social Connections: Unknown (04/15/2024)  Tobacco Use: High Risk (04/15/2024)     Readmission Risk Interventions     No data to display

## 2024-04-18 NOTE — Discharge Instructions (Signed)
 Follow with Primary MD Alen Husbands Urbano Gardener, FNP in 7 days   Get CBC, CMP, 2 view Chest X ray -  checked next visit with your primary MD   Activity: As tolerated with Full fall precautions use walker/cane & assistance as needed  Disposition Home   Diet: Heart Healthy   Special Instructions: If you have smoked or chewed Tobacco  in the last 2 yrs please stop smoking, stop any regular Alcohol   and or any Recreational drug use.  On your next visit with your primary care physician please Get Medicines reviewed and adjusted.  Please request your Prim.MD to go over all Hospital Tests and Procedure/Radiological results at the follow up, please get all Hospital records sent to your Prim MD by signing hospital release before you go home.  If you experience worsening of your admission symptoms, develop shortness of breath, life threatening emergency, suicidal or homicidal thoughts you must seek medical attention immediately by calling 911 or calling your MD immediately  if symptoms less severe.  You Must read complete instructions/literature along with all the possible adverse reactions/side effects for all the Medicines you take and that have been prescribed to you. Take any new Medicines after you have completely understood and accpet all the possible adverse reactions/side effects.   Do not drive when taking Pain medications.  Do not take more than prescribed Pain, Sleep and Anxiety Medications  Wear Seat belts while driving.

## 2024-04-18 NOTE — Plan of Care (Signed)

## 2024-04-25 ENCOUNTER — Ambulatory Visit (HOSPITAL_COMMUNITY)
Admission: EM | Admit: 2024-04-25 | Discharge: 2024-04-25 | Disposition: A | Discharge status: Home / Self Care | Attending: Family Medicine | Admitting: Family Medicine

## 2024-04-25 ENCOUNTER — Ambulatory Visit (INDEPENDENT_AMBULATORY_CARE_PROVIDER_SITE_OTHER)

## 2024-04-25 ENCOUNTER — Encounter (HOSPITAL_COMMUNITY): Payer: Self-pay | Admitting: Emergency Medicine

## 2024-04-25 DIAGNOSIS — J189 Pneumonia, unspecified organism: Secondary | ICD-10-CM | POA: Insufficient documentation

## 2024-04-25 LAB — BASIC METABOLIC PANEL WITH GFR
Anion gap: 12 (ref 5–15)
BUN: 14 mg/dL (ref 8–23)
CO2: 26 mmol/L (ref 22–32)
Calcium: 8.9 mg/dL (ref 8.9–10.3)
Chloride: 99 mmol/L (ref 98–111)
Creatinine, Ser: 0.73 mg/dL (ref 0.44–1.00)
GFR, Estimated: >60 mL/min (ref >60)
Glucose, Bld: 81 mg/dL (ref 70–99)
Potassium: 4 mmol/L (ref 3.5–5.1)
Sodium: 137 mmol/L (ref 135–145)

## 2024-04-25 LAB — CBC
HCT: 46 % (ref 36.0–46.0)
Hemoglobin: 14.7 g/dL (ref 12.0–15.0)
MCH: 30.6 pg (ref 26.0–34.0)
MCHC: 32 g/dL (ref 30.0–36.0)
MCV: 95.8 fL (ref 80.0–100.0)
Platelets: 439 10*3/uL — ABNORMAL HIGH (ref 150–400)
RBC: 4.8 MIL/uL (ref 3.87–5.11)
RDW: 13.9 % (ref 11.5–15.5)
WBC: 13.4 10*3/uL — ABNORMAL HIGH (ref 4.0–10.5)
nRBC: 0 % (ref 0.0–0.2)

## 2024-04-25 NOTE — Discharge Instructions (Addendum)
 By my review, your chest x-ray was much improved and did not show pneumonia or fluid.  The radiologist will also read your x-ray, and if their interpretation differs significantly from mine, and the management of your condition would change, we will call you.  We have drawn blood to check your blood counts and your electrolytes and sugar.  Staff will notify you if there is anything significantly abnormal.  All reports will also go to your MyChart.  I am glad you are going to follow-up with your primary care on May 5.

## 2024-04-25 NOTE — ED Triage Notes (Signed)
 Pt reports was seen here on 4/21 and sent to ED for PNA and sepsis. Hospitalist wanted patient to have repeat chest xray and blood work. Patient states that her PCP doesn't do xray so came here to have it done.

## 2024-04-25 NOTE — ED Provider Notes (Signed)
 [facility]    CSN: [id number] Arrival date & time: 04/25/24  4098      History   Chief Complaint Chief Complaint  Patient presents with   Follow-up    HPI Candace Middleton is a 63 y.o. female.   HPI Here for follow-up from her right lower lobe pneumonia and COPD exacerbation that she was admitted for April 21 through April 25.  She does feel better though she is still tired.  She is still producing some mucus but Mucinex  is helping.  No vomiting or nausea no fever at home.  She is allergic to sulfa and gabapentin   The hospitalist at discharge wanted her to have a follow-up chest x-ray and a CBC and BMP done.  She will follow-up with her primary care on May 5, but they do not have x-ray in the building. Past Medical History:  Diagnosis Date   Allergy    Anxiety    Arthritis    Cancer (HCC)    bladder ca   COPD (chronic obstructive pulmonary disease) (HCC)    Coronary artery disease    Depression    GERD (gastroesophageal reflux disease)    History of kidney stones    Hyperlipidemia    Neuromuscular disorder (HCC) 2014   RSD - nerve blocks in back for treatment    Osteoporosis 09/05/2019    Patient Active Problem List   Diagnosis Date Noted   SOB (shortness of breath) 04/15/2024   Severe sepsis (HCC) 04/15/2024   Leukocytosis 04/15/2024   Acute hypoxic respiratory failure (HCC) 04/15/2024   CAP (community acquired pneumonia) 04/14/2024   Osteoporosis 09/05/2019   COPD with acute exacerbation (HCC) 02/05/2018   Depression 02/05/2018   Coronary artery vasospasm (HCC) 09/22/2015   Precordial pain    Coronary artery disease 09/01/2015   HLD (hyperlipidemia) 09/01/2015   Tobacco abuse 09/01/2015   History of bladder cancer 04/26/2015   History of tobacco use 06/24/2013   RSD (reflex sympathetic dystrophy) 06/24/2013   Complex regional pain syndrome of left lower extremity 06/24/2013   Neuromuscular disorder (HCC) 2014    Past Surgical History:   Procedure Laterality Date   bladder cancer  2006   BLADDER TUMOR EXCISION  12/25/2004   BREAST SURGERY  "long time ago"   lumpectomy    CARDIAC CATHETERIZATION N/A 09/07/2015   Procedure: Left Heart Cath and Coronary Angiography;  Surgeon: Lucendia Rusk, MD;  Location: [facility] INVASIVE CV LAB;  Service: Cardiovascular;  Laterality: N/A;   CESAREAN SECTION  1982   CYSTOSCOPY WITH BIOPSY N/A 09/26/2023   Procedure: CYSTOSCOPY WITH BLADDER  BIOPSY;  Surgeon: Melody Spurling., MD;  Location: [facility];  Service: Urology;  Laterality: N/A;  45 MINS FOR CASE   CYSTOSCOPY/URETEROSCOPY/HOLMIUM LASER/STENT PLACEMENT Left 09/26/2023   Procedure: CYSTOSCOPY LEFT RETROGRADE PYELOGRAM/URETEROSCOPY/BASKETING OF STONE;  Surgeon: Melody Spurling., MD;  Location: [facility];  Service: Urology;  Laterality: Left;   ENDOMETRIAL ABLATION  12/25/1993    OB History   No obstetric history on file.      Home Medications    Prior to Admission medications   Medication Sig Start Date End Date Taking? Authorizing Provider  albuterol  (PROVENTIL ) (2.5 MG/3ML) 0.083% nebulizer solution Take 3 mLs (2.5 mg total) by nebulization every 6 (six) hours as needed for wheezing or shortness of breath. 04/02/24   Harlow Lighter, [place]  N, FNP  albuterol  (VENTOLIN  HFA) 108 (90 Base) MCG/ACT inhaler Inhale 2 puffs into the lungs every 6 (six)  hours as needed.    [provider]  amLODipine  (NORVASC ) 5 MG tablet Take 5 mg by mouth at bedtime. 01/22/24   [provider]  aspirin  EC 81 MG tablet Take 1 tablet (81 mg total) by mouth daily. 08/13/15   Lynne Sat, PA-C  atorvastatin  (LIPITOR) 20 MG tablet TAKE 1 TABLET BY MOUTH EVERY DAY Patient taking differently: Take 20 mg by mouth every evening. 06/22/20   Cresenzo, John V, MD  budeson-glycopyrrolate -formoterol  (BREZTRI ) 160-9-4.8 MCG/ACT AERO inhaler Inhale 2 puffs into the lungs 2 (two) times daily.    [provider]  metoprolol  succinate (TOPROL -XL)  25 MG 24 hr tablet TAKE 1/2 TABLET(12.5 MG) BY MOUTH EVERY MORNING 04/08/24   Tolia, Sunit, DO  nicotine  (NICODERM CQ  - DOSED IN MG/24 HOURS) 14 mg/24hr patch Place 1 patch (14 mg total) onto the skin daily as needed (smoking cessation). 04/18/24   Cala Castleman, MD    Family History Family History  Problem Relation Age of Onset   Heart disease Mother    Thyroid  disease Sister    Melanoma Sister    Diabetes Brother    Epilepsy Son    Early death Father    Pulmonary embolism Father    Thyroid  disease Brother    Colon cancer Neg Hx     Social History Social History   Tobacco Use   Smoking status: Every Day    Current packs/day: 0.50    Average packs/day: 0.5 packs/day for 30.0 years (15.0 ttl pk-yrs)    Types: Cigarettes   Smokeless tobacco: Never  Vaping Use   Vaping status: Never Used  Substance Use Topics   Alcohol  use: No   Drug use: No     Allergies   Gabapentin  and Sulfa antibiotics   Review of Systems Review of Systems   Physical Exam Triage Vital Signs ED Triage Vitals [04/25/24 0830]  Encounter Vitals Group     BP 127/70     Systolic BP Percentile      Diastolic BP Percentile      Pulse Rate 65     Resp 15     Temp 97.6 F (36.4 C)     Temp Source Oral     SpO2 97 %     Weight      Height      Head Circumference      Peak Flow      Pain Score 0     Pain Loc      Pain Education      Exclude from Growth Chart    No data found.  Updated Vital Signs BP 127/70 (BP Location: Left Arm)   Pulse 65   Temp 97.6 F (36.4 C) (Oral)   Resp 15   SpO2 97%   Visual Acuity Right Eye Distance:   Left Eye Distance:   Bilateral Distance:    Right Eye Near:   Left Eye Near:    Bilateral Near:     Physical Exam Vitals reviewed.  Constitutional:      General: She is not in acute distress.    Appearance: She is not toxic-appearing.     Comments: Thin  HENT:     Mouth/Throat:     Mouth: Mucous membranes are moist.  Cardiovascular:     Rate  and Rhythm: Normal rate and regular rhythm.  Pulmonary:     Effort: Pulmonary effort is normal. No respiratory distress.     Breath sounds: No stridor. No  wheezing, rhonchi or rales.  Skin:    Coloration: Skin is not pale.  Neurological:     Mental Status: She is alert and oriented to person, place, and time.  Psychiatric:        Behavior: Behavior normal.      UC Treatments / Results  Labs (all labs ordered are listed, but only abnormal results are displayed) Labs Reviewed  CBC  BASIC METABOLIC PANEL WITH GFR    EKG   Radiology No results found.  Procedures Procedures (including critical care time)  Medications Ordered in UC Medications - No data to display  Initial Impression / Assessment and Plan / UC Course  I have reviewed the triage vital signs and the nursing notes.  Pertinent labs & imaging results that were available during my care of the patient were reviewed by me and considered in my medical decision making (see chart for details).     Chest x-ray today by my review is much improved from her April 21 x-ray.  There is no infiltrate that I can see.  She is advised of radiology overread.  CBC and BMP are drawn today and we will notify her of any abnormal results that are significantly abnormal.  She does have MyChart and all results will go to that also.  She will follow-up with primary care on May 5 Final Clinical Impressions(s) / UC Diagnoses   Final diagnoses:  Community acquired pneumonia of right lower lobe of lung     Discharge Instructions      By my review, your chest x-ray was much improved and did not show pneumonia or fluid.  The radiologist will also read your x-ray, and if their interpretation differs significantly from mine, and the management of your condition would change, we will call you.  We have drawn blood to check your blood counts and your electrolytes and sugar.  Staff will notify you if there is anything significantly abnormal.   All reports will also go to your MyChart.  I am glad you are going to follow-up with your primary care on May 5.     ED Prescriptions   None    PDMP not reviewed this encounter.   Ann Keto, MD 04/25/24 [phone]

## 2024-05-08 ENCOUNTER — Other Ambulatory Visit: Payer: Self-pay | Admitting: Cardiology

## 2024-05-08 DIAGNOSIS — R072 Precordial pain: Secondary | ICD-10-CM

## 2024-07-02 NOTE — Progress Notes (Unsigned)
 OV 07/02/2024  Subjective:  Patient ID: Candace Middleton, female , DOB: 1961-01-04 , age 63 y.o. , MRN: [id number] , ADDRESS: [address] [place] [address] PCP Ezra Montie Aquas, FNP Patient Care Team: Ezra Montie Aquas, FNP as PCP - General (Nurse Practitioner) Alvaro Ricardo KATHEE Raddle., MD as Consulting Physician (Urology) Euel Pipe, DO (Inactive) as Resident (Sports Medicine)  This Provider for this visit: Treatment Team:  Attending Provider: Geronimo Amel, MD    07/02/2024 -  No chief complaint on file.    HPI Candace Middleton 63 y.o. -admmitted  [phone] - 04/18/24 for CAP Pneumococall RLL. Seen by ID    CT Chest data from date: ****  - personally visualized and independently interpreted : *** - my findings are: *** arrative & Impression  CLINICAL DATA:  Pneumonia complication suspected. History of COPD. Shortness of breath and hypoxia.   EXAM: CT CHEST WITHOUT CONTRAST   TECHNIQUE: Multidetector CT imaging of the chest was performed following the standard protocol without IV contrast.   RADIATION DOSE REDUCTION: This exam was performed according to the departmental dose-optimization program which includes automated exposure control, adjustment of the mA and/or kV according to patient size and/or use of iterative reconstruction technique.   COMPARISON:  Same day chest radiograph and CT chest 06/26/2022   FINDINGS: Cardiovascular: Normal heart size. No pericardial effusion. Mild aortic atherosclerotic calcification.   Mediastinum/Nodes: Trachea is patent. Debris is present in the right mainstem bronchus. Unremarkable esophagus. No thoracic adenopathy noting limited sensitivity of noncontrast exam.   Lungs/Pleura: Advanced emphysema. Diffuse bronchial wall thickening greatest in the lower lobes with scattered mucous plugs. Interlobular septal thickening and patchy airspace opacities in the right middle lobe and right  lower lobe. No pleural effusion or pneumothorax.   Upper Abdomen: No acute abnormality.   Musculoskeletal: Acute mildly displaced fracture of the posterior left eleventh rib   IMPRESSION: 1. Right lower and middle lobe bronchopneumonia superimposed on advanced COPD. 2. Debris in the right mainstem bronchus with mucous plugging in the lower lobes and right middle lobe. Query aspiration. 3. Acute mildly displaced fracture of the posterior left eleventh rib.   Aortic Atherosclerosis (ICD10-I70.0) and Emphysema (ICD10-J43.9).     Electronically Signed   By: Norman Gatlin M.D.   On: 04/14/2024 23:04    PFT      No data to display             LAB RESULTS last 96 hours No results found.       has a past medical history of Allergy, Anxiety, Arthritis, Cancer (HCC), COPD (chronic obstructive pulmonary disease) (HCC), Coronary artery disease, Depression, GERD (gastroesophageal reflux disease), History of kidney stones, Hyperlipidemia, Neuromuscular disorder (HCC) (2014), and Osteoporosis (09/05/2019).   reports that she has been smoking cigarettes. She has a 15 pack-year smoking history. She has never used smokeless tobacco.  Past Surgical History:  Procedure Laterality Date   bladder cancer  2006   BLADDER TUMOR EXCISION  12/25/2004   BREAST SURGERY  long time ago   lumpectomy    CARDIAC CATHETERIZATION N/A 09/07/2015   Procedure: Left Heart Cath and Coronary Angiography;  Surgeon: Candyce GORMAN Reek, MD;  Location: [facility] INVASIVE CV LAB;  Service: Cardiovascular;  Laterality: N/A;   CESAREAN SECTION  1982   CYSTOSCOPY WITH BIOPSY N/A 09/26/2023   Procedure: CYSTOSCOPY WITH BLADDER  BIOPSY;  Surgeon: Alvaro Ricardo KATHEE Raddle., MD;  Location: [facility] ORS;  Service: Urology;  Laterality: N/A;  45 MINS FOR CASE   CYSTOSCOPY/URETEROSCOPY/HOLMIUM LASER/STENT PLACEMENT Left 09/26/2023   Procedure: CYSTOSCOPY LEFT RETROGRADE PYELOGRAM/URETEROSCOPY/BASKETING OF STONE;  Surgeon: Alvaro Ricardo KATHEE Mickey., MD;  Location: [facility];  Service: Urology;  Laterality: Left;   ENDOMETRIAL ABLATION  12/25/1993    Allergies  Allergen Reactions   Gabapentin  Hives    Patient reports facial swelling, hives, facial involvement    Sulfa Antibiotics Anaphylaxis    Onset at age 5, almost died because I stopped breathing.    Immunization History  Administered Date(s) Administered   Influenza,inj,Quad PF,6+ Mos 09/30/2015, 09/25/2018, 10/01/2019   Influenza-Unspecified 09/24/2017   Pneumococcal Polysaccharide-23 10/01/2019   Tdap 10/30/2018    Family History  Problem Relation Age of Onset   Heart disease Mother    Thyroid  disease Sister    Melanoma Sister    Diabetes Brother    Epilepsy Son    Early death Father    Pulmonary embolism Father    Thyroid  disease Brother    Colon cancer Neg Hx      Current Outpatient Medications:    albuterol  (PROVENTIL ) (2.5 MG/3ML) 0.083% nebulizer solution, Take 3 mLs (2.5 mg total) by nebulization every 6 (six) hours as needed for wheezing or shortness of breath., Disp: 75 mL, Rfl: 0   albuterol  (VENTOLIN  HFA) 108 (90 Base) MCG/ACT inhaler, Inhale 2 puffs into the lungs every 6 (six) hours as needed., Disp: , Rfl:    amLODipine  (NORVASC ) 5 MG tablet, Take 5 mg by mouth at bedtime., Disp: , Rfl:    aspirin  EC 81 MG tablet, Take 1 tablet (81 mg total) by mouth daily., Disp: 30 tablet, Rfl: 3   atorvastatin  (LIPITOR) 20 MG tablet, TAKE 1 TABLET BY MOUTH EVERY DAY (Patient taking differently: Take 20 mg by mouth every evening.), Disp: 90 tablet, Rfl: 3   budeson-glycopyrrolate -formoterol  (BREZTRI ) 160-9-4.8 MCG/ACT AERO inhaler, Inhale 2 puffs into the lungs 2 (two) times daily., Disp: , Rfl:    metoprolol  succinate (TOPROL -XL) 25 MG 24 hr tablet, TAKE 1/2 TABLET(12.5 MG) BY MOUTH EVERY MORNING, Disp: 15 tablet, Rfl: 0   nicotine  (NICODERM CQ  - DOSED IN MG/24 HOURS) 14 mg/24hr patch, Place 1 patch (14 mg total) onto the skin daily as needed (smoking  cessation)., Disp: 28 patch, Rfl: 0      Objective:   There were no vitals filed for this visit.  Estimated body mass index is 15.41 kg/m as calculated from the following:   Height as of 04/14/24: 4' 11 (1.499 m).   Weight as of 04/18/24: 76 lb 4.5 oz (34.6 kg).  @WEIGHTCHANGE @  There were no vitals filed for this visit.   Physical Exam   General: No distress. *** O2 at rest: *** Cane present: *** Sitting in wheel chair: *** Frail: *** Obese: *** Neuro: Alert and Oriented x 3. GCS 15. Speech normal Psych: Pleasant Resp:  Barrel Chest - ***.  Wheeze - ***, Crackles - ***, No overt respiratory distress CVS: Normal heart sounds. Murmurs - *** Ext: Stigmata of Connective Tissue Disease - *** HEENT: Normal upper airway. PEERL +. No post nasal drip        Assessment:     No diagnosis found.     Plan:     There are no Patient Instructions on file for this visit.   FOLLOWUP No follow-ups on file.    SIGNATURE    Dr. Dorethia Cave, M.D., F.C.C.P,  Pulmonary and Critical Care Medicine Staff Physician, [facility]  Center Director - Interstitial Lung Disease  Program  [facility] at [facility] [place], [place], [address]  Pager: [phone], If no answer or between  15:00h - 7:00h: call [phone] Telephone: [phone]  7:24 PM 07/02/2024   Moderate Complexity MDM OFFICE  2021 E/M guidelines, first released in 2021, with minor revisions added in 2023 and 2024 Must meet the requirements for 2 out of 3 dimensions to qualify.    Number and complexity of problems addressed Amount and/or complexity of data reviewed Risk of complications and/or morbidity  One or more chronic illness with mild exacerbation, OR progression, OR  side effects of treatment  Two or more stable chronic illnesses  One undiagnosed new problem with uncertain prognosis  One acute illness with systemic symptoms   One Acute  complicated injury Must meet the requirements for 1 of 3 of the categories)  Category 1: Tests and documents, historian  Any combination of 3 of the following:  Assessment requiring an independent historian  Review of prior external note(s) from each unique source  Review of results of each unique test  Ordering of each unique test    Category 2: Interpretation of tests   Independent interpretation of a test performed by another physician/other qualified health care professional (not separately reported)  Category 3: Discuss management/tests  Discussion of management or test interpretation with external physician/other qualified health care professional/appropriate source (not separately reported) Moderate risk of morbidity from additional diagnostic testing or treatment Examples only:  Prescription drug management  Decision regarding minor surgery with identfied patient or procedure risk factors  Decision regarding elective major surgery without identified patient or procedure risk factors  Diagnosis or treatment significantly limited by social determinants of health             HIGh Complexity  OFFICE   2021 E/M guidelines, first released in 2021, with minor revisions added in 2023. Must meet the requirements for 2 out of 3 dimensions to qualify.    Number and complexity of problems addressed Amount and/or complexity of data reviewed Risk of complications and/or morbidity  Severe exacerbation of chronic illness  Acute or chronic illnesses that may pose a threat to life or bodily function, e.g., multiple trauma, acute MI, pulmonary embolus, severe respiratory distress, progressive rheumatoid arthritis, psychiatric illness with potential threat to self or others, peritonitis, acute renal failure, abrupt change in neurological status Must meet the requirements for 2 of 3 of the categories)  Category 1: Tests and documents, historian  Any combination of 3 of the  following:  Assessment requiring an independent historian  Review of prior external note(s) from each unique source  Review of results of each unique test  Ordering of each unique test    Category 2: Interpretation of tests    Independent interpretation of a test performed by another physician/other qualified health care professional (not separately reported)  Category 3: Discuss management/tests  Discussion of management or test interpretation with external physician/other qualified health care professional/appropriate source (not separately reported)  HIGH risk of morbidity from additional diagnostic testing or treatment Examples only:  Drug therapy requiring intensive monitoring for toxicity  Decision for elective major surgery with identified pateint or procedure risk factors  Decision regarding hospitalization or escalation of level of care  Decision for DNR or to de-escalate care   Parenteral controlled  substances            LEGEND -  Independent interpretation involves the interpretation of a test for which there is a CPT code, and an interpretation or report is customary. When a review and interpretation of a test is performed and documented by the provider, but not separately reported (billed), then this would represent an independent interpretation. This report does not need to conform to the usual standards of a complete report of the test. This does not include interpretation of tests that do not have formal reports such as a complete blood count with differential and blood cultures. Examples would include reviewing a chest radiograph and documenting in the medical record an interpretation, but not separately reporting (billing) the interpretation of the chest radiograph.   An appropriate source includes professionals who are not health care professionals but may be involved in the management of the patient, such as a Clinical research associate, upper officer, case manager  or teacher, and does not include discussion with family or informal caregivers.    - SDOH: SDOH are the conditions in the environments where people are born, live, learn, work, play, worship, and age that affect a wide range of health, functioning, and quality-of-life outcomes and risks. (e.g., housing, food insecurity, transportation, etc.). SDOH-related Z codes ranging from Z55-Z65 are the ICD-10-CM diagnosis codes used to document SDOH data Z55 - Problems related to education and literacy Z56 - Problems related to employment and unemployment Z57 - Occupational exposure to risk factors Z58 - Problems related to physical environment Z59 - Problems related to housing and economic circumstances [phone] - Problems related to social environment [phone] - Problems related to upbringing [phone] - Other problems related to primary support group, including family circumstances Z18 - Problems related to certain psychosocial circumstances Z65 - Problems related to other psychosocial circumstances

## 2024-07-03 ENCOUNTER — Encounter: Payer: Self-pay | Admitting: Internal Medicine

## 2024-07-03 ENCOUNTER — Ambulatory Visit: Admitting: Internal Medicine

## 2024-07-03 VITALS — BP 102/52 | HR 70 | Temp 98.2°F | Ht 60.0 in | Wt 79.4 lb

## 2024-07-03 DIAGNOSIS — J449 Chronic obstructive pulmonary disease, unspecified: Secondary | ICD-10-CM

## 2024-07-03 DIAGNOSIS — R0609 Other forms of dyspnea: Secondary | ICD-10-CM | POA: Diagnosis not present

## 2024-07-03 DIAGNOSIS — R0902 Hypoxemia: Secondary | ICD-10-CM

## 2024-07-03 DIAGNOSIS — R634 Abnormal weight loss: Secondary | ICD-10-CM

## 2024-07-03 DIAGNOSIS — F17210 Nicotine dependence, cigarettes, uncomplicated: Secondary | ICD-10-CM

## 2024-07-03 DIAGNOSIS — Z8701 Personal history of pneumonia (recurrent): Secondary | ICD-10-CM

## 2024-07-03 DIAGNOSIS — Z87891 Personal history of nicotine dependence: Secondary | ICD-10-CM

## 2024-07-03 DIAGNOSIS — Z09 Encounter for follow-up examination after completed treatment for conditions other than malignant neoplasm: Secondary | ICD-10-CM | POA: Diagnosis not present

## 2024-07-03 DIAGNOSIS — Z8709 Personal history of other diseases of the respiratory system: Secondary | ICD-10-CM

## 2024-07-03 LAB — CBC WITH DIFFERENTIAL/PLATELET
Basophils Absolute: 0 K/uL (ref 0.0–0.1)
Basophils Relative: 0.5 % (ref 0.0–3.0)
Eosinophils Absolute: 0.1 K/uL (ref 0.0–0.7)
Eosinophils Relative: 1.8 % (ref 0.0–5.0)
HCT: 46.5 % — ABNORMAL HIGH (ref 36.0–46.0)
Hemoglobin: 15.4 g/dL — ABNORMAL HIGH (ref 12.0–15.0)
Lymphocytes Relative: 28 % (ref 12.0–46.0)
Lymphs Abs: 2.4 K/uL (ref 0.7–4.0)
MCHC: 33.1 g/dL (ref 30.0–36.0)
MCV: 95.1 fl (ref 78.0–100.0)
Monocytes Absolute: 0.8 K/uL (ref 0.1–1.0)
Monocytes Relative: 9.4 % (ref 3.0–12.0)
Neutro Abs: 5.1 K/uL (ref 1.4–7.7)
Neutrophils Relative %: 60.3 % (ref 43.0–77.0)
Platelets: 322 K/uL (ref 150.0–400.0)
RBC: 4.88 Mil/uL (ref 3.87–5.11)
RDW: 14.8 % (ref 11.5–15.5)
WBC: 8.5 K/uL (ref 4.0–10.5)

## 2024-07-03 MED ORDER — BREZTRI AEROSPHERE 160-9-4.8 MCG/ACT IN AERO: 2.0000 | INHALATION_SPRAY | Freq: Two times a day (BID) | RESPIRATORY_TRACT | Status: DC

## 2024-07-03 NOTE — Patient Instructions (Addendum)
 ICD-10-CM   1. Chronic obstructive pulmonary disease, unspecified COPD type (HCC)  J44.9     2. DOE (dyspnea on exertion)  R06.09     3. Exercise hypoxemia  R09.02     4. Hospital discharge follow-up  Z09     5. History of pneumococcal pneumonia  Z87.01     6. History of acute respiratory failure  Z87.09      Definitely respiratory/emphysema.  Severity needs to be staged but good news you do not desaturate with exertion   Plan  - check alpha 1 Anti Trypsin Phenotype blood work - do QUantiferon Gold TB blood test - do cbc with diff - if eosinophil high - can consider biolotic  - do  ECHO - do HRCT supine and prone in 2 months -do full PFT  -xx - continue Breztri   2 puff twice daily  - continue albuterol  neb as needed  - CMA to do refill   - do ONO on room air  - QUIT SMOKING  - see place to contac below   FOllowup 6-8 weeks with aPP but after completing all of above  Xxxxxx Dear [name],   Congratulations for your interest in quitting smoking!  Find a program that suits you best: when you want to quit, how you need support, where you live, and how you like to learn.    If you're ready to get started TODAY, consider scheduling a visit through [email or web address].  Appointments are available from 8am to 8pm, Monday to Friday.   Most health insurance plans will cover some level of tobacco cessation visits and medications.    Additional Resources: [employer] are also available to help you quit & provide the support you'll need. Many programs are available in both [place] and Spanish and have a long history of successfully helping people get off and stay off tobacco.    Quit Smoking Apps:  quitSTART at [employer]iousBroker.de QuitGuide?at ForgetParking.dk Online education and resources: Smokefree  at [employer].gov Free Telephone Coaching: QuitNow,  Call 1-800-QUIT-NOW (347-722-3594) or Text-  Ready to (281) 741-5555 *Quitline [place] has teamed up with Medicaid to offer a free 14 week program    Vaping- Want to Quit? Free 24/7 support. Call [facility]  [place], [place], [place], [place], [place]  [facility]

## 2024-07-03 NOTE — Addendum Note (Signed)
 Addended by: ROLANDA POWELL SAILOR on: 07/03/2024 11:46 AM   Modules accepted: Orders

## 2024-07-04 ENCOUNTER — Telehealth (HOSPITAL_COMMUNITY): Payer: Self-pay

## 2024-07-04 NOTE — Telephone Encounter (Signed)
 LVM for patient regarding making an appointment for Candace Middleton

## 2024-07-07 ENCOUNTER — Telehealth: Payer: Self-pay | Admitting: Internal Medicine

## 2024-07-07 ENCOUNTER — Encounter (HOSPITAL_COMMUNITY): Payer: Self-pay

## 2024-07-07 LAB — QUANTIFERON-TB GOLD PLUS
Mitogen-NIL: 9.58 [IU]/mL
NIL: 0.01 [IU]/mL
QuantiFERON-TB Gold Plus: NEGATIVE
TB1-NIL: 0 [IU]/mL
TB2-NIL: 0 [IU]/mL

## 2024-07-07 MED ORDER — ALBUTEROL SULFATE (2.5 MG/3ML) 0.083% IN NEBU: 2.5000 mg | INHALATION_SOLUTION | Freq: Four times a day (QID) | RESPIRATORY_TRACT | 0 refills | Status: DC | PRN

## 2024-07-07 NOTE — Telephone Encounter (Signed)
 Rx sent to pharmacy per MR lov note & pt is aware.

## 2024-07-07 NOTE — Telephone Encounter (Signed)
 Copied from CRM [id number]. Topic: Clinical - Medication Refill >> Jul 07, 2024  8:41 AM Whitney O wrote: Medication: albuterol  (PROVENTIL ) (2.5 MG/3ML) 0.083% nebulizer solution Medication   Has the patient contacted their pharmacy? No no more refills (Agent: If no, request that the patient contact the pharmacy for the refill. If patient does not wish to contact the pharmacy document the reason why and proceed with request.) (Agent: If yes, when and what did the pharmacy advise?)  This is the patient's preferred pharmacy:  [id number] - [place], [place] - [address] AT [facility] OF [address] [place] [address] Phone: [phone] Fax: [phone]  [facility] [address] [place] [address] Phone: [phone] Fax: [phone]  Is this the correct pharmacy for this prescription? Yes If no, delete pharmacy and type the correct one. [id number] [place], [place] - [address] AT [facility] OF [address] [place] [address] Phone: [phone] Fax: [phone]    Has the prescription been filled recently? No  Is the patient out of the medication? Yes  Has the patient been seen for an appointment in the last year OR does the patient have an upcoming appointment? Yes  Can we respond through MyChart? Yes  Agent: Please be advised that Rx refills may take up to 3 business days. We ask that you follow-up with your pharmacy.

## 2024-07-09 LAB — ALPHA-1 ANTITRYPSIN PHENOTYPE: A-1 Antitrypsin, Ser: 122 mg/dL (ref 83–199)

## 2024-07-12 ENCOUNTER — Ambulatory Visit: Payer: Self-pay | Admitting: Internal Medicine

## 2024-07-12 NOTE — Progress Notes (Signed)
 Normal blood work

## 2024-07-17 ENCOUNTER — Other Ambulatory Visit: Payer: Self-pay

## 2024-07-17 ENCOUNTER — Emergency Department (HOSPITAL_COMMUNITY)
Admission: EM | Admit: 2024-07-17 | Discharge: 2024-07-17 | Discharge status: Against Medical Advice | Source: Ambulatory Visit | Attending: Emergency Medicine | Admitting: Emergency Medicine

## 2024-07-17 ENCOUNTER — Encounter (HOSPITAL_COMMUNITY): Payer: Self-pay | Admitting: Emergency Medicine

## 2024-07-17 ENCOUNTER — Emergency Department (HOSPITAL_COMMUNITY)

## 2024-07-17 DIAGNOSIS — R059 Cough, unspecified: Secondary | ICD-10-CM | POA: Insufficient documentation

## 2024-07-17 DIAGNOSIS — R079 Chest pain, unspecified: Secondary | ICD-10-CM | POA: Insufficient documentation

## 2024-07-17 DIAGNOSIS — Z5321 Procedure and treatment not carried out due to patient leaving prior to being seen by health care provider: Secondary | ICD-10-CM | POA: Diagnosis not present

## 2024-07-17 DIAGNOSIS — R0602 Shortness of breath: Secondary | ICD-10-CM | POA: Insufficient documentation

## 2024-07-17 LAB — TROPONIN I (HIGH SENSITIVITY)
Troponin I (High Sensitivity): 2 ng/L (ref <18)
Troponin I (High Sensitivity): 3 ng/L (ref <18)

## 2024-07-17 LAB — BASIC METABOLIC PANEL WITH GFR
Anion gap: 9 (ref 5–15)
BUN: 11 mg/dL (ref 8–23)
CO2: 24 mmol/L (ref 22–32)
Calcium: 9.2 mg/dL (ref 8.9–10.3)
Chloride: 106 mmol/L (ref 98–111)
Creatinine, Ser: 0.6 mg/dL (ref 0.44–1.00)
GFR, Estimated: >60 mL/min (ref >60)
Glucose, Bld: 104 mg/dL — ABNORMAL HIGH (ref 70–99)
Potassium: 3.8 mmol/L (ref 3.5–5.1)
Sodium: 139 mmol/L (ref 135–145)

## 2024-07-17 LAB — CBC
HCT: 45.3 % (ref 36.0–46.0)
Hemoglobin: 15.1 g/dL — ABNORMAL HIGH (ref 12.0–15.0)
MCH: 31.5 pg (ref 26.0–34.0)
MCHC: 33.3 g/dL (ref 30.0–36.0)
MCV: 94.6 fL (ref 80.0–100.0)
Platelets: 290 K/uL (ref 150–400)
RBC: 4.79 MIL/uL (ref 3.87–5.11)
RDW: 14.2 % (ref 11.5–15.5)
WBC: 7 K/uL (ref 4.0–10.5)
nRBC: 0 % (ref 0.0–0.2)

## 2024-07-17 NOTE — ED Provider Triage Note (Signed)
 Emergency Medicine Provider Triage Evaluation Note  Candace Middleton , a 63 y.o. female  was evaluated in triage.  Pt complains of chest pain, SOB, some cough.  Review of Systems  Positive: SOB  Negative: Fever  Physical Exam  BP 133/85   Pulse 66   Resp 20   SpO2 99%  Gen:   Awake, no distress   Resp:  Normal effort  MSK:   Moves extremities without difficulty  Other:    Medical Decision Making  Medically screening exam initiated at 12:14 PM.  Appropriate orders placed.  Candace Middleton was informed that the remainder of the evaluation will be completed by another provider, this initial triage assessment does not replace that evaluation, and the importance of remaining in the ED until their evaluation is complete.     Shermon Warren SAILOR, PA-C 07/17/24 1214

## 2024-07-17 NOTE — ED Triage Notes (Signed)
 Pt presents with CP radiating to both shoulders and neck up into bilateral jaws since last night.  Pt called her PCP this morning and was instructed to come to the ED for eval.  No n/v  Pt has occasional SHOB as chronic COPD patient.  Does not wear home O2.

## 2024-07-30 ENCOUNTER — Telehealth (HOSPITAL_COMMUNITY): Payer: Self-pay

## 2024-07-30 NOTE — Telephone Encounter (Signed)
 Called pt to see if she was interested in participating in the PR program. No answer. Left VM and sent letter to her MyChart.

## 2024-08-11 ENCOUNTER — Telehealth (HOSPITAL_COMMUNITY): Payer: Self-pay

## 2024-08-11 NOTE — Telephone Encounter (Signed)
 LVM for pt, 3rd attempt to call and schedule pt for [facility]

## 2024-08-11 NOTE — Telephone Encounter (Signed)
 Pulmonary staff has reached out to patient on 7/11, 8/06 & 8/18 regarding pulmonary rehab and have not been successful in reaching patient. Sending MyChart message.  Closing referral.

## 2024-08-12 ENCOUNTER — Ambulatory Visit (HOSPITAL_COMMUNITY)
Admission: RE | Admit: 2024-08-12 | Discharge: 2024-08-12 | Disposition: A | Discharge status: Home / Self Care | Source: Ambulatory Visit | Attending: Internal Medicine | Admitting: Internal Medicine

## 2024-08-12 DIAGNOSIS — R0902 Hypoxemia: Secondary | ICD-10-CM | POA: Insufficient documentation

## 2024-08-12 DIAGNOSIS — R0609 Other forms of dyspnea: Secondary | ICD-10-CM

## 2024-08-12 DIAGNOSIS — Z87891 Personal history of nicotine dependence: Secondary | ICD-10-CM | POA: Diagnosis present

## 2024-08-12 DIAGNOSIS — J449 Chronic obstructive pulmonary disease, unspecified: Secondary | ICD-10-CM | POA: Insufficient documentation

## 2024-08-12 LAB — ECHOCARDIOGRAM COMPLETE
AR max vel: 1.6 cm2
AV Area VTI: 1.54 cm2
AV Area mean vel: 1.71 cm2
AV Mean grad: 3 mmHg
AV Peak grad: 5.7 mmHg
Ao pk vel: 1.2 m/s
S' Lateral: 2.63 cm

## 2024-08-27 ENCOUNTER — Ambulatory Visit
Admission: RE | Admit: 2024-08-27 | Discharge: 2024-08-27 | Disposition: A | Discharge status: Home / Self Care | Source: Ambulatory Visit | Attending: Internal Medicine | Admitting: Internal Medicine

## 2024-08-27 DIAGNOSIS — R0609 Other forms of dyspnea: Secondary | ICD-10-CM

## 2024-08-27 DIAGNOSIS — Z87891 Personal history of nicotine dependence: Secondary | ICD-10-CM

## 2024-08-27 DIAGNOSIS — J449 Chronic obstructive pulmonary disease, unspecified: Secondary | ICD-10-CM

## 2024-09-08 ENCOUNTER — Encounter: Payer: Self-pay | Admitting: Primary Care

## 2024-09-08 ENCOUNTER — Ambulatory Visit: Admitting: Primary Care

## 2024-09-08 ENCOUNTER — Telehealth: Payer: Self-pay | Admitting: Primary Care

## 2024-09-08 ENCOUNTER — Ambulatory Visit: Admitting: Internal Medicine

## 2024-09-08 VITALS — BP 116/70 | HR 64 | Temp 97.6°F | Ht 60.0 in | Wt 79.8 lb

## 2024-09-08 DIAGNOSIS — Z87891 Personal history of nicotine dependence: Secondary | ICD-10-CM

## 2024-09-08 DIAGNOSIS — F17210 Nicotine dependence, cigarettes, uncomplicated: Secondary | ICD-10-CM | POA: Diagnosis not present

## 2024-09-08 DIAGNOSIS — Z23 Encounter for immunization: Secondary | ICD-10-CM | POA: Diagnosis not present

## 2024-09-08 DIAGNOSIS — I2510 Atherosclerotic heart disease of native coronary artery without angina pectoris: Secondary | ICD-10-CM

## 2024-09-08 DIAGNOSIS — Z8701 Personal history of pneumonia (recurrent): Secondary | ICD-10-CM

## 2024-09-08 DIAGNOSIS — R0609 Other forms of dyspnea: Secondary | ICD-10-CM

## 2024-09-08 DIAGNOSIS — J449 Chronic obstructive pulmonary disease, unspecified: Secondary | ICD-10-CM

## 2024-09-08 DIAGNOSIS — Z8709 Personal history of other diseases of the respiratory system: Secondary | ICD-10-CM

## 2024-09-08 DIAGNOSIS — J441 Chronic obstructive pulmonary disease with (acute) exacerbation: Secondary | ICD-10-CM

## 2024-09-08 DIAGNOSIS — Z720 Tobacco use: Secondary | ICD-10-CM

## 2024-09-08 DIAGNOSIS — J45909 Unspecified asthma, uncomplicated: Secondary | ICD-10-CM | POA: Diagnosis not present

## 2024-09-08 DIAGNOSIS — J9601 Acute respiratory failure with hypoxia: Secondary | ICD-10-CM

## 2024-09-08 LAB — PULMONARY FUNCTION TEST
DL/VA % pred: 42 %
DL/VA: 1.83 ml/min/mmHg/L
DLCO cor % pred: 33 %
DLCO cor: 5.7 ml/min/mmHg
DLCO unc % pred: 34 %
DLCO unc: 5.98 ml/min/mmHg
FEF 25-75 Post: 0.46 L/s
FEF 25-75 Pre: 0.33 L/s
FEF2575-%Change-Post: 38 %
FEF2575-%Pred-Post: 23 %
FEF2575-%Pred-Pre: 17 %
FEV1-%Change-Post: 20 %
FEV1-%Pred-Post: 43 %
FEV1-%Pred-Pre: 36 %
FEV1-Post: 0.89 L
FEV1-Pre: 0.74 L
FEV1FVC-%Change-Post: 6 %
FEV1FVC-%Pred-Pre: 55 %
FEV6-%Change-Post: 12 %
FEV6-%Pred-Post: 73 %
FEV6-%Pred-Pre: 65 %
FEV6-Post: 1.89 L
FEV6-Pre: 1.68 L
FEV6FVC-%Change-Post: 0 %
FEV6FVC-%Pred-Post: 100 %
FEV6FVC-%Pred-Pre: 100 %
FVC-%Change-Post: 13 %
FVC-%Pred-Post: 73 %
FVC-%Pred-Pre: 64 %
FVC-Post: 1.97 L
FVC-Pre: 1.74 L
Post FEV1/FVC ratio: 45 %
Post FEV6/FVC ratio: 96 %
Pre FEV1/FVC ratio: 43 %
Pre FEV6/FVC Ratio: 96 %
RV % pred: 215 %
RV: 4.07 L
TLC % pred: 132 %
TLC: 5.89 L

## 2024-09-08 LAB — POCT EXHALED NITRIC OXIDE: FeNO level (ppb): 8

## 2024-09-08 MED ORDER — ALBUTEROL SULFATE (2.5 MG/3ML) 0.083% IN NEBU: 2.5000 mg | INHALATION_SOLUTION | Freq: Four times a day (QID) | RESPIRATORY_TRACT | 5 refills | Status: AC | PRN

## 2024-09-08 NOTE — Patient Instructions (Addendum)
  VISIT SUMMARY: You came in today for a follow-up visit after your initial consultation and diagnostic testing. We discussed your chronic obstructive pulmonary disease (COPD) and emphysema, your recent hospitalization for pneumonia, and your ongoing symptoms and treatments. We also reviewed your smoking status, heart health, and the results of your recent tests.  YOUR PLAN: -CHRONIC OBSTRUCTIVE PULMONARY DISEASE AND ASTHMA WITH EMPHYSEMA: You have COPD with emphysema and possibly asthma, which means your lungs are damaged and inflamed, making it hard to breathe. Continue using your Breztri  inhaler twice daily and your albuterol  inhaler as needed. We are considering starting you on Ohtuvayre  nebulizer twice daily or Nucala biologic therapy. We will also order a respiratory allergy  panel and an FENO test to check for airway inflammation. You received the pneumonia and flu vaccines today. We will discuss with Dr. Geronimo about starting Ohtuvayre  or Nucala. Annual low-dose CT scans for lung cancer screening are recommended due to your smoking history.  -TOBACCO USE DISORDER: You are still smoking but are working on quitting. Quitting smoking is very important to prevent further lung damage and worsening of your COPD and asthma. We encourage you to continue your efforts to quit smoking and pick a quit date prior to your next visit. You can call 1-800-quit-now for free support and nicotine  replacement x 1 month.   -ATHEROSCLEROTIC HEART DISEASE OF NATIVE CORONARY ARTERY WITHOUT ANGINA PECTORIS: You have mild calcification and thickening of your aortic valve, but no significant coronary artery disease. Continue taking Lipitor and monitor your cholesterol levels regularly.  INSTRUCTIONS: Please follow up with Dr. Geronimo to discuss starting Ohtuvayre  or Nucala. Continue using your current medications as prescribed and work on quitting smoking. Annual low-dose CT scans for lung cancer screening are  recommended (next due September 2026). Monitor your cholesterol levels regularly.  Orders: FENO Labs today (respiratory allergy  panel) Flu and pneumonia vaccina   Follow-up 3-4 months with Dr. Geronimo

## 2024-09-08 NOTE — Telephone Encounter (Signed)
 Please review my note, patient main complaints are exertional dyspnea, chest tightness/wheezign and morning cough. Breztri  is helpeful. Would you consider starting patient on ohtuvayre  or nucala?? I am getting resp allergy  panel and FENO today. PFTs showed severe obstructive airway disease with reversibility. HRCT negative for ILD, emphysema. Alpha 1 level was normal.

## 2024-09-08 NOTE — Patient Instructions (Signed)
 Full PFT performed today.

## 2024-09-08 NOTE — Progress Notes (Signed)
 Full PFT performed today.

## 2024-09-08 NOTE — Progress Notes (Signed)
 @Patient  ID: Candace Middleton, female    DOB: Dec 03, 1961, 63 y.o.   MRN: [id number]  Chief Complaint  Patient presents with   Medical Management of Chronic Issues    PFT Results     Referring provider: No ref. provider found  HPI: 63 year old female, current smoker. PMH significant for CAD, COPD, CAP, complex pain syndrome, osteoporosis, hx bladder cancer, tobacco use.   Previous [facility] pulmonary encounter: 07/03/2024 -   Chief Complaint  Patient presents with   Consult    PCP referred. Pt has COPD and was admitted to the hospital with pneumonia and severe sepsis. SOB that gets worse with exertion.  Pt states that at home her o2 gets down to the 80%s after exertion.    Candace Middleton 63 y.o. -admmitted  [id number] - 04/18/24 for CAP Pneumococall RLL. Seen by ID recalls getting sick around April 02, 2024 going to urgent care getting antibiotics and prednisone  but then getting worse and then having rib fracture on the left side and then getting hospitalized.  Since discharge she is cut down on smoking but still continues to smoke.  Since admission she has improved a lot she is doing her ADLs but still is experiencing desaturations when she exercises [she does not have home oxygen and she was not aware of this preadmission] she finds it difficult to vacuum.  Her physical conditioning is almost back to baseline.  But she notes that she has ways to go.  She also wants to quit smoking.  She is on Breztri  which is helping her.  She wants albuterol  refilled.  She wants to know how bad her COPD is and the implications of having COPD.  She is lost 40 pounds of weight in the last 1 year which she attributes to taking care of her sibling and parent  Based on symptoms of fatigue, shortness of breath and cough and exercise hypoxemia.  1. Chronic obstructive pulmonary disease, unspecified COPD type (HCC)  J44.9       2. DOE (dyspnea on exertion)  R06.09       3. Exercise hypoxemia  R09.02        4. Hospital discharge follow-up  Z09       5. History of pneumococcal pneumonia  Z87.01       6. History of acute respiratory failure  Z87.09         Definitely respiratory/emphysema.  Severity needs to be staged but good news you do not desaturate with exertion     Plan  - check alpha 1 Anti Trypsin Phenotype blood work - do QUantiferon Gold TB blood test - do cbc with diff - if eosinophil high - can consider biolotic  - do  ECHO - do HRCT supine and prone in 2 months -do full PFT   -xx - continue Breztri   2 puff twice daily  - continue albuterol  neb as needed             - CMA to do refill    - do ONO on room air   - QUIT SMOKING             - see place to contac below     FOllowup 6-8 weeks with aPP but after completing all of above   09/08/2024- INTERIM HX Discussed the use of AI scribe software for clinical note transcription with the patient, who gave verbal consent to proceed.  History of Present Illness Candace Middleton is a  63 year old female with COPD and emphysema who presents for a follow-up after initial consultation and diagnostic testing. She was referred by Dr. Geronimo for follow-up after initial consultation and diagnostic testing.  She was hospitalized in April for community-acquired pneumonia. Prior to hospitalization, she visited urgent care and received antibiotics and prednisone , but her symptoms worsened, leading to a rib fracture from coughing. Since discharge, she has reduced smoking but continues to smoke and is working on quitting. She is currently using Breztri  inhaler, which she feels is helping.  She has a history of COPD and emphysema, with recent diagnostic tests including a CT scan showing emphysema. Her lung function test showed 43% predicted lung function with some reversibility after bronchodilator use.  She experiences fatigue, shortness of breath, cough, and exercise-induced hypoxemia. Her main respiratory symptoms include  exertional shortness of breath, wheezing, chest tightness, and a morning cough that is 'crappy but nothing, I can't ever get anything up.' She uses Breztri  inhaler twice daily and has albuterol  inhaler and nebulizer for severe episodes, which she tries to use sparingly.  Her echocardiogram in August showed normal ejection fraction and no evidence of heart failure, with some mild calcification of the aortic valve. She is on Lipitor for cholesterol management and takes it consistently.  She has a family history of lung cancer, as her husband died from it seven years ago. She has a cat but reports no significant allergy  symptoms. She has not had childhood asthma or exercise-induced asthma symptoms as a child. She has had two to three flare-ups per year requiring prednisone  but has not been hospitalized for these exacerbations since the initial pneumonia episode.   09/08/2024 FENO>> 8   No results found for: NITRICOXIDE This result suggests low (<25) Type 2 (T2) airway inflammation indicating a low likelihood of active T2-driven airway inflammation; reduced probability of response to inhaled corticosteroids.     Allergies  Allergen Reactions   Gabapentin  Hives    Patient reports facial swelling, hives, facial involvement    Sulfa Antibiotics Anaphylaxis    Onset at age 11, almost died because I stopped breathing.    Immunization History  Administered Date(s) Administered   Influenza,inj,Quad PF,6+ Mos 09/30/2015, 09/25/2018, 10/01/2019   Influenza-Unspecified 09/24/2017   Pneumococcal Polysaccharide-23 10/01/2019   Tdap 10/30/2018    Past Medical History:  Diagnosis Date   Allergy     Anxiety    Arthritis    Cancer (HCC)    bladder ca   COPD (chronic obstructive pulmonary disease) (HCC)    Coronary artery disease    Depression    GERD (gastroesophageal reflux disease)    History of kidney stones    Hyperlipidemia    Neuromuscular disorder (HCC) 2014   RSD - nerve blocks in  back for treatment    Osteoporosis 09/05/2019    Tobacco History: Social History   Tobacco Use  Smoking Status Every Day   Current packs/day: 0.50   Average packs/day: 0.5 packs/day for 30.0 years (15.0 ttl pk-yrs)   Types: Cigarettes  Smokeless Tobacco Never  Tobacco Comments   Pt smokes less than 0.5 ppd. AB, CMA 09-08-2024   Ready to quit: Not Answered Counseling given: Not Answered Tobacco comments: Pt smokes less than 0.5 ppd. AB, CMA 09-08-2024   Outpatient Medications Prior to Visit  Medication Sig Dispense Refill   albuterol  (PROVENTIL ) (2.5 MG/3ML) 0.083% nebulizer solution Take 3 mLs (2.5 mg total) by nebulization every 6 (six) hours as needed for wheezing or shortness of breath.  75 mL 0   albuterol  (VENTOLIN  HFA) 108 (90 Base) MCG/ACT inhaler Inhale 2 puffs into the lungs every 6 (six) hours as needed.     amLODipine  (NORVASC ) 5 MG tablet Take 5 mg by mouth at bedtime.     aspirin  EC 81 MG tablet Take 1 tablet (81 mg total) by mouth daily. 30 tablet 3   atorvastatin  (LIPITOR) 20 MG tablet TAKE 1 TABLET BY MOUTH EVERY DAY (Patient taking differently: Take 20 mg by mouth every evening.) 90 tablet 3   budeson-glycopyrrolate -formoterol  (BREZTRI ) 160-9-4.8 MCG/ACT AERO inhaler Inhale 2 puffs into the lungs 2 (two) times daily.     budesonide -glycopyrrolate -formoterol  (BREZTRI  AEROSPHERE) 160-9-4.8 MCG/ACT AERO inhaler Inhale 2 puffs into the lungs in the morning and at bedtime.     metoprolol  succinate (TOPROL -XL) 25 MG 24 hr tablet TAKE 1/2 TABLET(12.5 MG) BY MOUTH EVERY MORNING 15 tablet 0   nicotine  (NICODERM CQ  - DOSED IN MG/24 HOURS) 14 mg/24hr patch Place 1 patch (14 mg total) onto the skin daily as needed (smoking cessation). 28 patch 0   No facility-administered medications prior to visit.   Review of Systems  Review of Systems  Constitutional: Negative.   HENT: Negative.    Respiratory:  Positive for cough, chest tightness, shortness of breath and wheezing.      Physical Exam  BP 116/70   Pulse 64   Temp 97.6 F (36.4 C)   Ht 5' (1.524 m)   Wt 79 lb 12.8 oz (36.2 kg)   SpO2 95% Comment: RA  BMI 15.58 kg/m  Physical Exam Constitutional:      General: She is not in acute distress.    Appearance: Normal appearance. She is not ill-appearing.  HENT:     Head: Normocephalic and atraumatic.     Mouth/Throat:     Mouth: Mucous membranes are moist.     Pharynx: Oropharynx is clear.  Cardiovascular:     Rate and Rhythm: Normal rate and regular rhythm.  Pulmonary:     Effort: Pulmonary effort is normal.     Breath sounds: Normal breath sounds.  Musculoskeletal:        General: Normal range of motion.  Skin:    General: Skin is warm and dry.  Neurological:     General: No focal deficit present.     Mental Status: She is alert and oriented to person, place, and time. Mental status is at baseline.  Psychiatric:        Mood and Affect: Mood normal.        Behavior: Behavior normal.        Thought Content: Thought content normal.        Judgment: Judgment normal.     Lab Results:  CBC    Component Value Date/Time   WBC 7.0 07/17/2024 1149   RBC 4.79 07/17/2024 1149   HGB 15.1 (H) 07/17/2024 1149   HGB 15.3 09/27/2018 1048   HCT 45.3 07/17/2024 1149   HCT 46.9 (H) 09/27/2018 1048   PLT 290 07/17/2024 1149   PLT 307 09/27/2018 1048   MCV 94.6 07/17/2024 1149   MCV 91 09/27/2018 1048   MCH 31.5 07/17/2024 1149   MCHC 33.3 07/17/2024 1149   RDW 14.2 07/17/2024 1149   RDW 13.0 09/27/2018 1048   LYMPHSABS 2.4 07/03/2024 1121   LYMPHSABS 3.1 02/04/2018 1429   MONOABS 0.8 07/03/2024 1121   EOSABS 0.1 07/03/2024 1121   EOSABS 0.1 02/04/2018 1429   BASOSABS 0.0 07/03/2024 1121  BASOSABS 0.0 02/04/2018 1429    BMET    Component Value Date/Time   NA 139 07/17/2024 1149   NA 140 09/27/2018 1048   K 3.8 07/17/2024 1149   CL 106 07/17/2024 1149   CO2 24 07/17/2024 1149   GLUCOSE 104 (H) 07/17/2024 1149   BUN 11 07/17/2024  1149   BUN 6 09/27/2018 1048   CREATININE 0.60 07/17/2024 1149   CREATININE 0.64 09/01/2015 1532   CALCIUM  9.2 07/17/2024 1149   GFRNONAA >60 07/17/2024 1149   GFRNONAA >89 09/01/2015 1532   GFRAA 108 09/27/2018 1048   GFRAA >89 09/01/2015 1532    BNP    Component Value Date/Time   BNP 196.4 (H) 04/18/2024 0430    ProBNP No results found for: PROBNP  Imaging: CT Chest High Resolution Result Date: 08/30/2024 CLINICAL DATA:  Shortness of breath on exertion. EXAM: CT CHEST WITHOUT CONTRAST TECHNIQUE: Multidetector CT imaging of the chest was performed following the standard protocol without intravenous contrast. High resolution imaging of the lungs, as well as inspiratory and expiratory imaging, was performed. RADIATION DOSE REDUCTION: This exam was performed according to the departmental dose-optimization program which includes automated exposure control, adjustment of the mA and/or kV according to patient size and/or use of iterative reconstruction technique. COMPARISON:  04/14/2024, 06/26/2022. FINDINGS: Cardiovascular: Atherosclerotic calcification of the aorta and coronary arteries. Heart size normal. No pericardial effusion. Mediastinum/Nodes: No pathologically enlarged mediastinal or axillary lymph nodes. Hilar regions are difficult to definitively evaluate without IV contrast. Esophagus is grossly unremarkable. Lungs/Pleura: Negative for subpleural reticulation, traction bronchiectasis/bronchiolectasis, ground glass, architectural distortion or honeycombing. Centrilobular and paraseptal emphysema. Calcified granulomas. Lungs are otherwise clear. No pleural fluid. Airway is unremarkable. Expiratory phase imaging was not performed in true expiration, limiting the evaluation for air trapping. Upper Abdomen: Subcentimeter low-attenuation lesions in the liver are too small to characterize. Visualized portions of the liver, adrenal glands, kidneys, spleen, pancreas, stomach and bowel are  otherwise grossly unremarkable. No upper abdominal adenopathy. Musculoskeletal: Degenerative changes in the spine. IMPRESSION: 1. No evidence of interstitial lung disease or air trapping. 2. Aortic atherosclerosis (ICD10-I70.0). Coronary artery calcification. 3. Emphysema (ICD10-J43.9). Low-dose CT lung cancer screening is recommended for patients who are 3-13 years of age with a 20+ pack-year history of smoking and who are currently smoking or quit <=15 years ago. Electronically Signed   By: Newell Eke M.D.   On: 08/30/2024 16:31   ECHOCARDIOGRAM COMPLETE Result Date: 08/12/2024    ECHOCARDIOGRAM REPORT   Patient Name:   Candace Middleton Date of Exam: 08/12/2024 Medical Rec #:  [id number]            Height:       60.0 in Accession #:    [id number]           Weight:       79.4 lb Date of Birth:  1961/10/05            BSA:          1.260 m Patient Age:    63 years             BP:           121/73 mmHg Patient Gender: F                    HR:           71 bpm. Exam Location:  [address] Procedure: 2D Echo, Cardiac Doppler and Color Doppler (Both Spectral and  Color            Flow Doppler were utilized during procedure). Indications:    COPD J49.9  History:        Patient has prior history of Echocardiogram examinations. CAD,                 COPD, Signs/Symptoms:Dyspnea on exertion,; Risk                 Factors:Hyperlipidemia. Smoking history.  Sonographer:    Sharlet Hamilton RDCS Referring Phys: [id number] MURALI RAMASWAMY IMPRESSIONS  1. Left ventricular ejection fraction, by estimation, is 55 to 60%. The left ventricle has normal function. The left ventricle has no regional wall motion abnormalities. Left ventricular diastolic parameters were normal.  2. Right ventricular systolic function is normal. The right ventricular size is normal. Tricuspid regurgitation signal is inadequate for assessing PA pressure.  3. The mitral valve is grossly normal. Trivial mitral valve regurgitation. No evidence of mitral  stenosis.  4. The aortic valve is tricuspid. There is mild calcification of the aortic valve. There is mild thickening of the aortic valve. Aortic valve regurgitation is moderate. Aortic valve sclerosis is present, with no evidence of aortic valve stenosis.  5. The inferior vena cava is normal in size with greater than 50% respiratory variability, suggesting right atrial pressure of 3 mmHg. FINDINGS  Left Ventricle: Left ventricular ejection fraction, by estimation, is 55 to 60%. The left ventricle has normal function. The left ventricle has no regional wall motion abnormalities. The left ventricular internal cavity size was normal in size. There is  no left ventricular hypertrophy. Left ventricular diastolic parameters were normal. Right Ventricle: The right ventricular size is normal. No increase in right ventricular wall thickness. Right ventricular systolic function is normal. Tricuspid regurgitation signal is inadequate for assessing PA pressure. Left Atrium: Left atrial size was normal in size. Right Atrium: Right atrial size was normal in size. Pericardium: Trivial pericardial effusion is present. Mitral Valve: The mitral valve is grossly normal. Trivial mitral valve regurgitation. No evidence of mitral valve stenosis. Tricuspid Valve: The tricuspid valve is grossly normal. Tricuspid valve regurgitation is trivial. No evidence of tricuspid stenosis. Aortic Valve: The aortic valve is tricuspid. There is mild calcification of the aortic valve. There is mild thickening of the aortic valve. Aortic valve regurgitation is moderate. Aortic valve sclerosis is present, with no evidence of aortic valve stenosis. Aortic valve mean gradient measures 3.0 mmHg. Aortic valve peak gradient measures 5.7 mmHg. Aortic valve area, by VTI measures 1.54 cm. Pulmonic Valve: The pulmonic valve was grossly normal. Pulmonic valve regurgitation is not visualized. No evidence of pulmonic stenosis. Aorta: The aortic root is normal in  size and structure. Venous: The inferior vena cava is normal in size with greater than 50% respiratory variability, suggesting right atrial pressure of 3 mmHg. IAS/Shunts: The atrial septum is grossly normal.  LEFT VENTRICLE PLAX 2D LVIDd:         3.81 cm   Diastology LVIDs:         2.63 cm   LV e' medial:    8.49 cm/s LV PW:         1.05 cm   LV E/e' medial:  7.1 LV IVS:        0.70 cm   LV e' lateral:   8.59 cm/s LVOT diam:     1.60 cm   LV E/e' lateral: 7.0 LV SV:         39 LV  SV Index:   31 LVOT Area:     2.01 cm  RIGHT VENTRICLE             IVC RV Basal diam:  2.11 cm     IVC diam: 1.31 cm RV Mid diam:    1.73 cm RV S prime:     16.70 cm/s TAPSE (M-mode): 1.8 cm LEFT ATRIUM           Index        RIGHT ATRIUM           Index LA diam:      2.80 cm 2.22 cm/m   RA Pressure: 3.00 mmHg LA Vol (A2C): 33.8 ml 26.81 ml/m  RA Area:     7.11 cm LA Vol (A4C): 13.2 ml 10.47 ml/m  RA Volume:   12.40 ml  9.84 ml/m  AORTIC VALVE AV Area (Vmax):    1.60 cm AV Area (Vmean):   1.71 cm AV Area (VTI):     1.54 cm AV Vmax:           119.50 cm/s AV Vmean:          75.000 cm/s AV VTI:            0.256 m AV Peak Grad:      5.7 mmHg AV Mean Grad:      3.0 mmHg LVOT Vmax:         95.00 cm/s LVOT Vmean:        63.900 cm/s LVOT VTI:          0.196 m LVOT/AV VTI ratio: 0.77  AORTA Ao Root diam: 2.60 cm Ao Asc diam:  2.40 cm MV E velocity: 60.30 cm/s  TRICUSPID VALVE MV A velocity: 70.20 cm/s  Estimated RAP:  3.00 mmHg MV E/A ratio:  0.86                            SHUNTS                            Systemic VTI:  0.20 m                            Systemic Diam: 1.60 cm Darryle Decent MD Electronically signed by Darryle Decent MD Signature Date/Time: 08/12/2024/3:55:46 PM    Final      Assessment & Plan:   1. Stage 3 severe COPD by GOLD classification (HCC) (Primary) - albuterol  (PROVENTIL ) (2.5 MG/3ML) 0.083% nebulizer solution; Take 3 mLs (2.5 mg total) by nebulization every 6 (six) hours as needed for wheezing or shortness  of breath.  Dispense: 75 mL; Refill: 5 - Pneumococcal conjugate vaccine 20-valent (Prevnar-20) - RESPIRATORY ALLERGY  PANEL REGION II W/ RFLX: [place]; Future - Ambulatory Referral for Lung Cancer Scre - POCT EXHALED NITRIC OXIDE  - RESPIRATORY ALLERGY  PANEL REGION II W/ RFLX: [place] - Flu vaccine HIGH DOSE PF(Fluzone Trivalent) - Interpretation:  2. Immunization due  3. Tobacco abuse  Assessment and Plan Assessment & Plan Chronic obstructive pulmonary disease and asthma with emphysema Poorly controlled; COPD with emphysema as indicated by lung function test showing 43% predicted lung function with reversibility after bronchodilator use. Experiences exertional dyspnea, wheezing, chest tightness, and a morning cough. Breztri  inhaler has been somewhat effective. Has had 2-3 exacerbations per year requiring prednisone , and was  hospitalized once this year. CT scan showed emphysema but no pulmonary fibrosis or suspicious lung nodules. Echocardiogram was largely normal, with no evidence of pulmonary hypertension or significant heart failure. - Continue Breztri  inhaler two puffs twice daily - Use albuterol  every 4-6 hours inhaler as needed for breakthrough sob/wheezing - Starting patient on Ohtuvayre  nebulizer twice daily to decrease COPD flare ups  - Consider adding on biologic therapy  - Order respiratory allergy  panel - Order FENO test to assess airway inflammation - Administer pneumonia vaccine (Prevnar 20) today - Administer flu vaccine today - Discuss with Dr. Geronimo regarding starting Ohtuvayre  versus Nucala - Informed about the benefits of early detection through annual low-dose CT scans for lung cancer screening, given her smoking history and the absence of concerning nodules or masses.  Tobacco use disorder Continues to smoke but is working on quitting. Smoking cessation is crucial to prevent further lung damage and exacerbations of COPD and asthma. - Encourage  smoking cessation - Refer to lung cancer screening   Atherosclerotic heart disease of native coronary artery without angina pectoris Echocardiogram showed mild calcification and thickening of the aortic valve, with trivial mitral valve regurgitation and no significant coronary artery disease. On Lipitor and cholesterol levels are monitored regularly. - Continue Lipitor - Monitor cholesterol levels regularly  40 mins spent on case; > 50% face to face with patient reviewing testing, plan, new medication ohtuvayre , vaccines and smoking cessation    Candace LELON Ferrari, NP 09/08/2024

## 2024-09-09 LAB — RESPIRATORY ALLERGY PANEL REGION II W/ RFLX: ~~LOC~~
Allergen, A. alternata, m6: <0.1 kU/L
Allergen, Cedar tree, t12: <0.1 kU/L
Allergen, Comm Silver Birch, t9: <0.1 kU/L
Allergen, Cottonwood, t14: <0.1 kU/L
Allergen, D pternoyssinus,d7: <0.1 kU/L
Allergen, Mouse Urine Protein, e78: <0.1 kU/L
Allergen, Mulberry, t76: <0.1 kU/L
Allergen, Oak,t7: <0.1 kU/L
Allergen, P. notatum, m1: <0.1 kU/L
Aspergillus fumigatus, m3: <0.1 kU/L
Bermuda Grass: <0.1 kU/L
Box Elder IgE: <0.1 kU/L
CLADOSPORIUM HERBARUM (M2) IGE: <0.1 kU/L
COMMON RAGWEED (SHORT) (W1) IGE: <0.1 kU/L
Cat Dander: <0.1 kU/L
Class: 0
Class: 0
Class: 0
Class: 0
Class: 0
Class: 0
Class: 0
Class: 0
Class: 0
Class: 0
Class: 0
Class: 0
Class: 0
Class: 0
Class: 0
Class: 0
Class: 0
Class: 0
Class: 0
Class: 0
Class: 0
Class: 0
Class: 0
Cockroach: <0.1 kU/L
D. farinae: <0.1 kU/L
Dog Dander: <0.1 kU/L
Elm IgE: <0.1 kU/L
IgE (Immunoglobulin E), Serum: 663 kU/L — ABNORMAL HIGH (ref <=114)
Johnson Grass: 0.12 kU/L — ABNORMAL HIGH
Pecan/Hickory Tree IgE: <0.1 kU/L
Rough Pigweed  IgE: <0.1 kU/L
Sheep Sorrel IgE: <0.1 kU/L
Timothy Grass: <0.1 kU/L

## 2024-09-09 LAB — INTERPRETATION:

## 2024-09-09 NOTE — Telephone Encounter (Signed)
  Eos are < 150 - so no rule for nucala  I say let us  go with  - OTHUVAYRE now or at followu with me    reports that she has been smoking cigarettes. She has a 15 pack-year smoking history. She has never used smokeless tobacco.      Latest Ref Rng & Units 09/08/2024    8:39 AM  PFT Results  FVC-Pre L 1.74  P  FVC-Predicted Pre % 64  P  FVC-Post L 1.97  P  FVC-Predicted Post % 73  P  Pre FEV1/FVC % % 43  P  Post FEV1/FCV % % 45  P  FEV1-Pre L 0.74  P  FEV1-Predicted Pre % 36  P  FEV1-Post L 0.89  P  DLCO uncorrected ml/min/mmHg 5.98  P  DLCO UNC% % 34  P  DLCO corrected ml/min/mmHg 5.70  P  DLCO COR %Predicted % 33  P  DLVA Predicted % 42  P  TLC L 5.89  P  TLC % Predicted % 132  P  RV % Predicted % 215  P    P Preliminary result

## 2024-09-10 NOTE — Telephone Encounter (Signed)
 Thanks Dr. Geronimo. I will start process for ohtuvayre  and get her to follow-up with you  Pharmacy- I did not get paperwork signed by patient is that ok??? I can fill out and put in your folder

## 2024-09-12 MED ORDER — OHTUVAYRE 3 MG/2.5ML IN SUSP: 3.0000 mg | Freq: Two times a day (BID) | RESPIRATORY_TRACT | Status: AC

## 2024-09-12 NOTE — Telephone Encounter (Signed)
 Paperwork filled out

## 2024-09-14 ENCOUNTER — Ambulatory Visit: Payer: Self-pay | Admitting: Primary Care

## 2024-09-16 NOTE — Telephone Encounter (Signed)
 Can you help with this and where paperwork is?

## 2024-09-16 NOTE — Telephone Encounter (Signed)
 I have this. This was signed by Almarie Ferrari, NP already. This just needs the pt's signature.

## 2024-09-16 NOTE — Telephone Encounter (Signed)
 I have not seen this paperwork in our mailbox or come through our fax, does anyone know where it is?

## 2024-09-16 NOTE — Telephone Encounter (Signed)
 Ok thanks, do I need to call the patient and ask her to come in?

## 2024-09-17 NOTE — Telephone Encounter (Signed)
 Per last encounter:  Me to Hope Almarie ORN, NP  Issac Greig HERO, [facility]     09/15/24 12:05 PM Result Note Called patient.  Reviewed all information per [facility].  Patient verbalized understanding.  Will fill out PAP for Ohtuvayre  and leave at front desk for patient to sign.  Patient will make a return OV to see either Beth or Dr. Geronimo in 4-8 weeks when she comes in to sign Ohtuvayre  forms.  I filled out PAP for Ohtuvayre  and put on Beth's desk for signature.  Forms were signed and given to Ashlyn.  Patient just needs to come in and sign PAP for Ohtuvayre  and make the 4-8 week return OV to see Dr. Geronimo.

## 2024-09-18 ENCOUNTER — Telehealth: Payer: Self-pay

## 2024-09-18 NOTE — Telephone Encounter (Signed)
 See other encounter. Pt is coming in tomorrow to sign forms. Forms were given to the front office as I will not be here tomorrow and the signed form will go back into Beth's mailbox.

## 2024-09-18 NOTE — Telephone Encounter (Signed)
 I called and spoke to pt. I have the Ohtuvayre  paper work that has already been signed by Almarie Ferrari, NP and just needs to be signed by the pt. I spoke to pt and informed her that I have these papers for her if she has a time that worked best for her to stop by and sign them. Pt states she can be here right before 12 pm tomorrow to sign. I informed pt that would be fine as long as it is before 12 since our doors close early on Fridays. Pt verbalized understanding. NFN

## 2024-09-23 ENCOUNTER — Telehealth: Payer: Self-pay

## 2024-09-23 NOTE — Telephone Encounter (Signed)
 Received Ohtuvayre  new start paperwork. Completed form and faxed with clinicals and insurance card copy to [facility]   Phone#: [phone] Fax#: [phone]

## 2024-09-24 ENCOUNTER — Ambulatory Visit (INDEPENDENT_AMBULATORY_CARE_PROVIDER_SITE_OTHER)

## 2024-09-24 ENCOUNTER — Ambulatory Visit (HOSPITAL_COMMUNITY)
Admission: EM | Admit: 2024-09-24 | Discharge: 2024-09-24 | Disposition: A | Discharge status: Home / Self Care | Attending: Emergency Medicine | Admitting: Emergency Medicine

## 2024-09-24 ENCOUNTER — Encounter (HOSPITAL_COMMUNITY): Payer: Self-pay | Admitting: Emergency Medicine

## 2024-09-24 ENCOUNTER — Other Ambulatory Visit: Payer: Self-pay

## 2024-09-24 DIAGNOSIS — M533 Sacrococcygeal disorders, not elsewhere classified: Secondary | ICD-10-CM

## 2024-09-24 DIAGNOSIS — G8929 Other chronic pain: Secondary | ICD-10-CM | POA: Diagnosis not present

## 2024-09-24 DIAGNOSIS — M545 Low back pain, unspecified: Secondary | ICD-10-CM

## 2024-09-24 MED ORDER — DEXAMETHASONE SODIUM PHOSPHATE 10 MG/ML IJ SOLN
10.0000 mg | Freq: Once | INTRAMUSCULAR | Status: AC
  Administered 2024-09-24: 10 mg via INTRAMUSCULAR

## 2024-09-24 MED ORDER — DEXAMETHASONE SODIUM PHOSPHATE 10 MG/ML IJ SOLN
INTRAMUSCULAR | Status: AC
  Filled 2024-09-24: qty 1

## 2024-09-24 MED ORDER — PREDNISONE 10 MG PO TABS: ORAL_TABLET | ORAL | 0 refills | Status: DC

## 2024-09-24 NOTE — Telephone Encounter (Signed)
 Received fax from [employer] with summary of benefits. Referral form for Ohtuvayre  received. Rx will be triaged to [facility].. Once benefits investigation completed, pharmacy will reach out the patient to schedule shipment. If medication is unaffordable, patient will need to express financial hardship to be referred back to [place] [facility] for patient assistance program pre-screening.   Patient ID: [id number] Pharmacy phone: [phone] [place] [facility] Phone#: [phone]

## 2024-09-24 NOTE — Telephone Encounter (Signed)
 Received fax from [employer] confirming receipt of enrollment form.  Patient ID: [id number]

## 2024-09-24 NOTE — ED Triage Notes (Signed)
 Complains of back, hip and leg pain for 2 weeks -chronic pain, but much worse.  No known injury.  .  Pain is bilateral leg pain.  Patient takes oxycodone  -this is medicine intended for this pain.

## 2024-09-24 NOTE — ED Provider Notes (Signed)
 [facility]    CSN: [id number] Arrival date & time: 09/24/24  1205      History   Chief Complaint Chief Complaint  Patient presents with   Back Pain    HPI Candace Middleton is a 63 y.o. female.   Patient presents with tailbone pain that radiates to her bilateral hips and down her bilateral legs.  For about 2 weeks.  Patient denies any recent falls or injuries that she knows of.  Patient states that she does have chronic pain in her low back and her left leg from a neuromuscular disorder that she does take oxycodone  for.  Patient states that she has been taking oxycodone  without relief.  Patient denies any numbness, weakness, or tingling radiating down her legs.  Patient denies any bladder/bowel incontinence.  Patient is requesting an x-ray of her tailbone to ensure that there is no underlying injury here.  The history is provided by the patient and medical records.  Back Pain   Past Medical History:  Diagnosis Date   Allergy     Anxiety    Arthritis    Cancer (HCC)    bladder ca   COPD (chronic obstructive pulmonary disease) (HCC)    Coronary artery disease    Depression    GERD (gastroesophageal reflux disease)    History of kidney stones    Hyperlipidemia    Neuromuscular disorder (HCC) 2014   RSD - nerve blocks in back for treatment    Osteoporosis 09/05/2019    Patient Active Problem List   Diagnosis Date Noted   SOB (shortness of breath) 04/15/2024   Severe sepsis (HCC) 04/15/2024   Leukocytosis 04/15/2024   Acute hypoxic respiratory failure (HCC) 04/15/2024   CAP (community acquired pneumonia) 04/14/2024   Osteoporosis 09/05/2019   COPD with acute exacerbation (HCC) 02/05/2018   Depression 02/05/2018   Coronary artery vasospasm 09/22/2015   Precordial pain    Coronary artery disease 09/01/2015   HLD (hyperlipidemia) 09/01/2015   Tobacco abuse 09/01/2015   History of bladder cancer 04/26/2015   History of tobacco use 06/24/2013   RSD  (reflex sympathetic dystrophy) 06/24/2013   Complex regional pain syndrome of left lower extremity 06/24/2013   Neuromuscular disorder (HCC) 2014    Past Surgical History:  Procedure Laterality Date   bladder cancer  2006   BLADDER TUMOR EXCISION  12/25/2004   BREAST SURGERY  long time ago   lumpectomy    CARDIAC CATHETERIZATION N/A 09/07/2015   Procedure: Left Heart Cath and Coronary Angiography;  Surgeon: Candyce GORMAN Reek, MD;  Location: [facility] INVASIVE CV LAB;  Service: Cardiovascular;  Laterality: N/A;   CESAREAN SECTION  1982   CYSTOSCOPY WITH BIOPSY N/A 09/26/2023   Procedure: CYSTOSCOPY WITH BLADDER  BIOPSY;  Surgeon: Alvaro Ricardo KATHEE Mickey., MD;  Location: [facility];  Service: Urology;  Laterality: N/A;  45 MINS FOR CASE   CYSTOSCOPY/URETEROSCOPY/HOLMIUM LASER/STENT PLACEMENT Left 09/26/2023   Procedure: CYSTOSCOPY LEFT RETROGRADE PYELOGRAM/URETEROSCOPY/BASKETING OF STONE;  Surgeon: Alvaro Ricardo KATHEE Mickey., MD;  Location: [facility];  Service: Urology;  Laterality: Left;   ENDOMETRIAL ABLATION  12/25/1993    OB History   No obstetric history on file.      Home Medications    Prior to Admission medications   Medication Sig Start Date End Date Taking? Authorizing Provider  predniSONE  (DELTASONE ) 10 MG tablet Take 4 tabs by mouth daily for 3 days, then 3 tabs for 3 days, then 2 tabs for 2 days, then 1 tab  for 2 days 09/24/24  Yes Johnie Flaming A, NP  albuterol  (PROVENTIL ) (2.5 MG/3ML) 0.083% nebulizer solution Take 3 mLs (2.5 mg total) by nebulization every 6 (six) hours as needed for wheezing or shortness of breath. 09/08/24   Hope Almarie ORN, NP  albuterol  (VENTOLIN  HFA) 108 (90 Base) MCG/ACT inhaler Inhale 2 puffs into the lungs every 6 (six) hours as needed.    [provider]  amLODipine  (NORVASC ) 5 MG tablet Take 5 mg by mouth at bedtime. 01/22/24   [provider]  aspirin  EC 81 MG tablet Take 1 tablet (81 mg total) by mouth daily. 08/13/15   Marcus Annabella RAMAN,  PA-C  atorvastatin  (LIPITOR) 20 MG tablet TAKE 1 TABLET BY MOUTH EVERY DAY Patient taking differently: Take 20 mg by mouth every evening. 06/22/20   Cresenzo, John V, MD  budeson-glycopyrrolate -formoterol  (BREZTRI ) 160-9-4.8 MCG/ACT AERO inhaler Inhale 2 puffs into the lungs 2 (two) times daily.    [provider]  budesonide -glycopyrrolate -formoterol  (BREZTRI  AEROSPHERE) 160-9-4.8 MCG/ACT AERO inhaler Inhale 2 puffs into the lungs in the morning and at bedtime. 07/03/24   Geronimo Amel, MD  Ensifentrine  (OHTUVAYRE ) 3 MG/2.5ML SUSP Inhale 3 mg into the lungs in the morning and at bedtime. Patient not taking: Reported on 09/24/2024 09/12/24   Hope Almarie ORN, NP  metoprolol  succinate (TOPROL -XL) 25 MG 24 hr tablet TAKE 1/2 TABLET(12.5 MG) BY MOUTH EVERY MORNING 04/08/24   Tolia, Sunit, DO  nicotine  (NICODERM CQ  - DOSED IN MG/24 HOURS) 14 mg/24hr patch Place 1 patch (14 mg total) onto the skin daily as needed (smoking cessation). 04/18/24   Dennise Lavada POUR, MD    Family History Family History  Problem Relation Age of Onset   Heart disease Mother    Early death Father    Pulmonary embolism Father    Thyroid  disease Sister    Melanoma Sister    Diabetes Brother    Thyroid  disease Brother    Epilepsy Son    Colon cancer Neg Hx     Social History Social History   Tobacco Use   Smoking status: Every Day    Current packs/day: 0.50    Average packs/day: 0.5 packs/day for 30.0 years (15.0 ttl pk-yrs)    Types: Cigarettes   Smokeless tobacco: Never   Tobacco comments:    Pt smokes less than 0.5 ppd. AB, CMA 09-08-2024  Vaping Use   Vaping status: Never Used  Substance Use Topics   Alcohol  use: No   Drug use: No     Allergies   Gabapentin  and Sulfa antibiotics   Review of Systems Review of Systems  Musculoskeletal:  Positive for back pain.   Per HPI  Physical Exam Triage Vital Signs ED Triage Vitals  Encounter Vitals Group     BP 09/24/24 1325 110/74      Girls Systolic BP Percentile --      Girls Diastolic BP Percentile --      Boys Systolic BP Percentile --      Boys Diastolic BP Percentile --      Pulse Rate 09/24/24 1325 80     Resp 09/24/24 1325 20     Temp 09/24/24 1325 98 F (36.7 C)     Temp Source 09/24/24 1325 Oral     SpO2 09/24/24 1325 97 %     Weight --      Height --      Head Circumference --      Peak Flow --  Pain Score 09/24/24 1323 9     Pain Loc --      Pain Education --      Exclude from Growth Chart --    No data found.  Updated Vital Signs BP 110/74 (BP Location: Right Arm) Comment (BP Location): small cuff  Pulse 80   Temp 98 F (36.7 C) (Oral)   Resp 20   SpO2 97%   Visual Acuity Right Eye Distance:   Left Eye Distance:   Bilateral Distance:    Right Eye Near:   Left Eye Near:    Bilateral Near:     Physical Exam Vitals and nursing note reviewed.  Constitutional:      General: She is awake. She is not in acute distress.    Appearance: Normal appearance. She is well-developed and well-groomed. She is not ill-appearing.  Musculoskeletal:     Cervical back: Normal.     Thoracic back: Normal.     Lumbar back: Tenderness present. No swelling, edema, deformity or signs of trauma. Normal range of motion. Negative right straight leg raise test and negative left straight leg raise test.       Back:     Right hip: Normal. No tenderness.     Left hip: Normal. No tenderness.     Right upper leg: Normal. No tenderness.     Left upper leg: Normal. No tenderness.     Comments: Mild tenderness noted over coccyx and sacral region  Skin:    General: Skin is warm and dry.  Neurological:     Mental Status: She is alert.  Psychiatric:        Behavior: Behavior is cooperative.      UC Treatments / Results  Labs (all labs ordered are listed, but only abnormal results are displayed) Labs Reviewed - No data to display  EKG   Radiology DG Sacrum/Coccyx Result Date: 09/24/2024 CLINICAL DATA:   Back hip and leg pain 2 weeks. Tailbone pain. No injury. EXAM: SACRUM AND COCCYX - 2+ VIEW COMPARISON:  None Available. FINDINGS: Sacrum and coccyx are unremarkable without acute fracture. No focal lytic lesion. Minimal degenerate change of the spine. Remaining pelvic bones are unremarkable. Two nonspecific punctate densities over the right pelvis. IMPRESSION: No acute findings Electronically Signed   By: Toribio Agreste M.D.   On: 09/24/2024 15:07    Procedures Procedures (including critical care time)  Medications Ordered in UC Medications  dexamethasone  (DECADRON ) injection 10 mg (10 mg Intramuscular Given 09/24/24 1516)    Initial Impression / Assessment and Plan / UC Course  I have reviewed the triage vital signs and the nursing notes.  Pertinent labs & imaging results that were available during my care of the patient were reviewed by me and considered in my medical decision making (see chart for details).     Patient is overall well-appearing.  Vitals are stable.  X-ray ordered.  Based on my interpretation there is no acute osseous abnormality.  Radiology report confirms this.  Given IM Decadron  in clinic to help with pain.  Prescribed prednisone  taper for additional pain relief.  Recommended continuing with previously prescribed oxycodone  as needed for pain.  Discussed follow-up and return precautions. Final Clinical Impressions(s) / UC Diagnoses   Final diagnoses:  Tail bone pain  Chronic bilateral low back pain without sciatica     Discharge Instructions      Your x-ray is negative for any underlying fracture, dislocation, or abnormality.   You were given an injection  of Decadron  in clinic today to help with inflammation related to your pain. Start taking the prednisone  taper as instructed to on the prescription to help with inflammation related to your pain as well. You can continue to take your previously prescribed oxycodone  as needed for pain. Follow-up with your primary  care provider for further evaluation and management if your pain continues.     ED Prescriptions     Medication Sig Dispense Auth. Provider   predniSONE  (DELTASONE ) 10 MG tablet Take 4 tabs by mouth daily for 3 days, then 3 tabs for 3 days, then 2 tabs for 2 days, then 1 tab for 2 days 28 tablet Johnie Flaming A, NP      I have reviewed the PDMP during this encounter.   Johnie Flaming A, NP 09/24/24 1540

## 2024-09-24 NOTE — Discharge Instructions (Signed)
 Your x-ray is negative for any underlying fracture, dislocation, or abnormality.   You were given an injection of Decadron  in clinic today to help with inflammation related to your pain. Start taking the prednisone  taper as instructed to on the prescription to help with inflammation related to your pain as well. You can continue to take your previously prescribed oxycodone  as needed for pain. Follow-up with your primary care provider for further evaluation and management if your pain continues.

## 2024-09-26 ENCOUNTER — Telehealth (HOSPITAL_BASED_OUTPATIENT_CLINIC_OR_DEPARTMENT_OTHER): Payer: Self-pay | Admitting: *Deleted

## 2024-09-26 NOTE — Telephone Encounter (Signed)
**Note De-identified  [place] Obfuscation** Please advise 

## 2024-09-26 NOTE — Telephone Encounter (Signed)
 The respiratory allergy  panel only tests for common respiratory allergens in [place], suggesting the possibility of missed or different allergens. We can refer her to asthma/allergy  if she would like  Keep me updated on [place]  patient assistance

## 2024-09-26 NOTE — Telephone Encounter (Signed)
   Pre-operative Risk Assessment    Patient Name: Candace Middleton  DOB: 1961-06-02 MRN: [id number]   Date of last office visit: 06/12/22 DR. TOLIA Date of next office visit: NONE    Request for Surgical Clearance    Procedure:  COLONOSCOPY  Date of Surgery:  Clearance 10/02/24                                Surgeon:  DR. HUNG  Surgeon's Group or Practice Name:  [facility] Phone number:  [phone] Fax number:  [phone]   Type of Clearance Requested:   - Medical ; NONE INDICATED    Type of Anesthesia:  PROPOFOL     Additional requests/questions:    Bonney Niels Jest   09/26/2024, 4:59 PM

## 2024-09-28 NOTE — Progress Notes (Signed)
 There is a leaky heart valve - . Aortic valve regurgitation is moderate . Please ensure she is refrred to cardiology

## 2024-09-29 NOTE — Telephone Encounter (Signed)
   Name: Candace Middleton  DOB: [birth year]  MRN: [id number]  Primary Cardiologist: None  Chart reviewed as part of pre-operative protocol coverage. Because of [place] Ke [name] past medical history and time since last visit, she will require a follow-up in-office visit in order to better assess preoperative cardiovascular risk.  Pre-op covering staff: - Please schedule appointment and call patient to inform them. If patient already had an upcoming appointment within acceptable timeframe, please add pre-op clearance to the appointment notes so provider is aware. - Please contact requesting surgeon's office via preferred method (i.e, phone, fax) to inform them of need for appointment prior to surgery.  Barnie Hila, NP  09/29/2024, 12:20 PM

## 2024-09-29 NOTE — Telephone Encounter (Signed)
 1st attempt to reach pt regarding surgical clearance and the need for an IN OFFICE appointment.  Left pt a detailed message to call back and get that scheduled.

## 2024-09-29 NOTE — Telephone Encounter (Signed)
 Preop OV appt scheduled

## 2024-09-30 ENCOUNTER — Ambulatory Visit: Attending: Physician Assistant | Admitting: Physician Assistant

## 2024-09-30 ENCOUNTER — Encounter: Payer: Self-pay | Admitting: Physician Assistant

## 2024-09-30 VITALS — BP 100/64 | HR 82 | Ht <= 58 in | Wt 80.0 lb

## 2024-09-30 DIAGNOSIS — R0789 Other chest pain: Secondary | ICD-10-CM | POA: Diagnosis not present

## 2024-09-30 DIAGNOSIS — I25111 Atherosclerotic heart disease of native coronary artery with angina pectoris with documented spasm: Secondary | ICD-10-CM

## 2024-09-30 DIAGNOSIS — Z01818 Encounter for other preprocedural examination: Secondary | ICD-10-CM

## 2024-09-30 DIAGNOSIS — I2583 Coronary atherosclerosis due to lipid rich plaque: Secondary | ICD-10-CM | POA: Diagnosis not present

## 2024-09-30 DIAGNOSIS — I351 Nonrheumatic aortic (valve) insufficiency: Secondary | ICD-10-CM

## 2024-09-30 DIAGNOSIS — I2510 Atherosclerotic heart disease of native coronary artery without angina pectoris: Secondary | ICD-10-CM

## 2024-09-30 DIAGNOSIS — I201 Angina pectoris with documented spasm: Secondary | ICD-10-CM

## 2024-09-30 DIAGNOSIS — J449 Chronic obstructive pulmonary disease, unspecified: Secondary | ICD-10-CM

## 2024-09-30 MED ORDER — AMLODIPINE BESYLATE 2.5 MG PO TABS: 2.5000 mg | ORAL_TABLET | Freq: Every day | ORAL | 3 refills | Status: DC

## 2024-09-30 NOTE — Patient Instructions (Addendum)
 Medication Instructions:  DECREASE Amlodipine  to 2.5 mg daily   *If you need a refill on your cardiac medications before your next appointment, please call your pharmacy*  Lab Work: Fasting lipid panel   If you have labs (blood work) drawn today and your tests are completely normal, you will receive your results only by: MyChart Message (if you have MyChart) OR A paper copy in the mail If you have any lab test that is abnormal or we need to change your treatment, we will call you to review the results.  Testing/Procedures: Lexiscan   Your physician has requested that you have a lexiscan  myoview. For further information please visit [email or web address] Please follow instruction sheet, as given.   Follow-Up: At [facility], you and your health needs are our priority.  As part of our continuing mission to provide you with exceptional heart care, our providers are all part of one team.  This team includes your primary Cardiologist (physician) and Advanced Practice Providers or APPs (Physician Assistants and Nurse Practitioners) who all work together to provide you with the care you need, when you need it.  Your next appointment:   6 month(s)  Provider:   Madonna Large, DO

## 2024-09-30 NOTE — Progress Notes (Signed)
 Cardiology Office Note:  .   Date:  09/30/2024  ID:  Candace Middleton, DOB 15-Jun-1961, MRN [id number] PCP: Candace House, MD  [facility] Providers Cardiologist:  Candace Large, DO    History of Present Illness: .   Candace Middleton is a 63 y.o. female with history of Aortic atherosclerosis, calcified coronary atherosclerosis involving the RCA (not gated CT 03/07/2021), history of coronary vasospasm (cath 2016), COPD, family history of heart disease, smoking 0.5ppd and postmenopausal female, COPD.  Patient was last seen 2023 with ongoing chest pain. Coronary CTA ordered but never done.  Echo 07/2024 showed mod AI and told to f/u with cards. Patient also need preop for colonoscopy. She is still smoking 1/2 ppd. She has chronic DOE-vacuuming, getting up first thing in the am. Occasional chest pain. While sitting in her chair she had a chest pressure/pain into her neck that lasted maybe 10 min in July. Went to ED next day and was discharged. Also has chest tightness with activity. Goes away with rest.   ROS:    Studies Reviewed: Candace Middleton         Prior CV Studies:    Echo 07/2024 IMPRESSIONS     1. Left ventricular ejection fraction, by estimation, is 55 to 60%. The  left ventricle has normal function. The left ventricle has no regional  wall motion abnormalities. Left ventricular diastolic parameters were  normal.   2. Right ventricular systolic function is normal. The right ventricular  size is normal. Tricuspid regurgitation signal is inadequate for assessing  PA pressure.   3. The mitral valve is grossly normal. Trivial mitral valve  regurgitation. No evidence of mitral stenosis.   4. The aortic valve is tricuspid. There is mild calcification of the  aortic valve. There is mild thickening of the aortic valve. Aortic valve  regurgitation is moderate. Aortic valve sclerosis is present, with no  evidence of aortic valve stenosis.   5. The inferior vena cava is normal in size  with greater than 50%  respiratory variability, suggesting right atrial pressure of 3 mmHg.   Echocardiogram: 05/17/2022:  Normal LV systolic function with visual EF 55-60%. Left ventricle cavity is normal in size. Normal global wall motion. Normal diastolic filling pattern, normal LAP.  Left atrial cavity is normal in size. An atrial septal aneurysm without a patent foramen ovale is present. Moderate (Grade III) aortic regurgitation. Compared to 04/21/2021 no significant change.   Stress Testing: Exercise Myoview stress test 04/27/2021: 1 Day Rest/Stress Protocol. Exercise time 8 minutes 44seconds on Bruce protocol, achieved 10.16 METS, and 86% of APMHR. Stress ECG negative for ischemia.  Normal myocardial perfusion without evidence of reversible ischemia or prior infarct. Stress LVEF 64%. All segments of the LV demonstrate normal wall motion andthickening. No prior studies available for comparison.   Heart Catheterization: 09/07/2015: The left ventricular systolic function is normal. Mild nonobstructive CAD. Left coronary system does vasodilate with intrcoronary nitroglycerin .   Continue aggressive preventive therapy.  She could have coronary vasospasm.  Consider adjusting medical therapy accordingly.  Will give a prescription of SL NTG.  Risk Assessment/Calculations:             Physical Exam:   VS:  BP 100/64 (BP Location: Left Arm, Patient Position: Sitting, Cuff Size: Normal)   Pulse 82   Ht 4' 9 (1.448 m)   Wt 80 lb (36.3 kg)   SpO2 94%   BMI 17.31 kg/m    Orhtostatics: No data found. Wt Readings from  Last 3 Encounters:  09/30/24 80 lb (36.3 kg)  09/08/24 79 lb 12.8 oz (36.2 kg)  07/03/24 79 lb 6.4 oz (36 kg)    GEN: Well nourished, well developed in no acute distress NECK: No JVD; No carotid bruits CARDIAC:  RRR, 2/6 diastolic murmur RSB RESPIRATORY:  decreased breath sounds without rales, wheezing or rhonchi  ABDOMEN: Soft, non-tender, non-distended EXTREMITIES:   No edema; No deformity   ASSESSMENT AND PLAN: .    Preop clearance for colonoscopy for Dr. Rollin 10/02/24-patient having exertional chest pain and DOE. Need to order NST prior to clearing her for surgery.  According to the Revised Cardiac Risk Index (RCRI), her Perioperative Risk of Major Cardiac Event is (%): 0.9  Her Functional Capacity in METs is: 5.72 according to the Duke Activity Status Index (DASI).   CAD with mild nonobstructive CAD and coronary spasm on cath 2016-now with exertional angina. Check lexiscan  myoview -on lipitor-check FLP  Moderate AI on echo 07/2024-similar to last echo   COPD-continues to smoke-smoking cessation advised.        Informed Consent   Shared Decision Making/Informed Consent The risks [chest pain, shortness of breath, cardiac arrhythmias, dizziness, blood pressure fluctuations, myocardial infarction, stroke/transient ischemic attack, nausea, vomiting, allergic reaction, radiation exposure, metallic taste sensation and life-threatening complications (estimated to be 1 in 10,000)], benefits (risk stratification, diagnosing coronary artery disease, treatment guidance) and alternatives of a nuclear stress test were discussed in detail with [name] and she agrees to proceed.     Dispo: f/u in 6 months or sooner pending above testing.  Signed, Candace Pavy, PA-C

## 2024-10-01 NOTE — Telephone Encounter (Signed)
   Name: AMORY ZBIKOWSKI  DOB: 1961/02/04  MRN: [id number]  Primary Cardiologist: Madonna Large, DO  Chart reviewed as part of pre-operative protocol coverage. Because of Cleone Hulick Haberl's past medical history and time since last visit, she will require a follow-up in-office visit in order to better assess preoperative cardiovascular risk. He is having ongoing chest pain per note from Rosaline Pavy, [place].    Pre-op covering staff: - Please schedule appointment and call patient to inform them. If patient already had an upcoming appointment within acceptable timeframe, please add pre-op clearance to the appointment notes so provider is aware. - Please contact requesting surgeon's office via preferred method (i.e, phone, fax) to inform them of need for appointment prior to surgery.    Lamarr Satterfield, NP  10/01/2024, 8:45 AM

## 2024-10-01 NOTE — Telephone Encounter (Signed)
 Her stress test is on the 10/13/2024 see can be seen afterwards either APP or me for pre-procedure evaluation. Current follow up on 11/12/2024 is far out.   Candace Middleton [place], DO, FACC

## 2024-10-01 NOTE — Progress Notes (Signed)
 Called and spoke to pt - advised of echo results per MR. Pt states that her cardiologist has already addressed this with her. Pt verbalized understanding, NFN

## 2024-10-01 NOTE — Telephone Encounter (Signed)
 Candace [name] [name] Rollin [place], Candace Middleton; Candace [name] Middleton, CMA Can't clear for colonoscopy on Thurs with ongoing chest pain  See ov notes from [name], [facility]  I will reach out to the preo APP if any further notes needed or ok to fax notes to requesting office.

## 2024-10-01 NOTE — Telephone Encounter (Signed)
 Left message for the pt to call back as she will need a f/u appt after her stress test has been done to f/u on results from stress test as well as preop clearance.

## 2024-10-01 NOTE — Telephone Encounter (Signed)
 Pt called back and s/w scheduling team and scheduled in office appt 11/12/24. Pt asked to see Dr. Michele per the operator.   I will update all parties involved.

## 2024-10-02 NOTE — Telephone Encounter (Signed)
 S/w the pt per D. Tolia to move f/u appt up sooner. Pt has been scheduled to see Olivia Pavy, [facility] 10/20/24 f/u stress test and preop clearance needed. Stress test 10/13/24.   I asked the pt if she wanted to still keep the appt 11/12/24 with Dr. Michele. Pt said that would be fine. We discussed that if PAC at 10/20/24 feels the pt does not need to keep the 110/2025 appt with MD they will cancel then, pt agreeable to plan of care.

## 2024-10-03 ENCOUNTER — Encounter (HOSPITAL_COMMUNITY): Payer: Self-pay | Admitting: *Deleted

## 2024-10-05 NOTE — Progress Notes (Signed)
 Keept appt with Landry Ferrari in Nov 2025 xxxxx IMPRESSION: 1. No evidence of interstitial lung disease or air trapping. 2. Aortic atherosclerosis (ICD10-I70.0). Coronary artery calcification. 3. Emphysema (ICD10-J43.9). Low-dose CT lung cancer screening is recommended for patients who are 2-65 years of age with a 20+ pack-year history of smoking and who are currently smoking or quit <=15 years ago.     Electronically Signed   By: Newell Eke M.D.   On: 08/30/2024 16:31

## 2024-10-06 NOTE — Progress Notes (Deleted)
 Cardiology Office Note:  .   Date:  10/06/2024  ID:  [name], DOB November 01, 1961, MRN [id number] PCP: Alec House, MD  [facility] Providers Cardiologist:  Madonna Large, DO { Click to update primary MD,subspecialty MD or APP then REFRESH:1}   History of Present Illness: .   Candace Middleton is a 63 y.o. female  with history of Aortic atherosclerosis, calcified coronary atherosclerosis involving the RCA (not gated CT 03/07/2021), history of coronary vasospasm (cath 2016), COPD, family history of heart disease, smoking 0.5ppd and postmenopausal female, COPD.   Patient was last seen 2023 with ongoing chest pain. Coronary CTA ordered but never done.   Echo 07/2024 showed mod AI and told to f/u with cards. Patient also need preop for colonoscopy. She is still smoking 1/2 ppd. She has chronic DOE-vacuuming, getting up first thing in the am. Occasional chest pain. While sitting in her chair she had a chest pressure/pain into her neck that lasted maybe 10 min in July. Went to ED next day and was discharged. Also has chest tightness with activity. Goes away with rest.   I saw the patient for preop clearance and he was having chest pain and DOE. NST ordered.  ROS: ***  Studies Reviewed: SABRA         Prior CV Studies: {Select studies to display:26339}  Echo 07/2024 IMPRESSIONS     1. Left ventricular ejection fraction, by estimation, is 55 to 60%. The  left ventricle has normal function. The left ventricle has no regional  wall motion abnormalities. Left ventricular diastolic parameters were  normal.   2. Right ventricular systolic function is normal. The right ventricular  size is normal. Tricuspid regurgitation signal is inadequate for assessing  PA pressure.   3. The mitral valve is grossly normal. Trivial mitral valve  regurgitation. No evidence of mitral stenosis.   4. The aortic valve is tricuspid. There is mild calcification of the  aortic valve. There is mild  thickening of the aortic valve. Aortic valve  regurgitation is moderate. Aortic valve sclerosis is present, with no  evidence of aortic valve stenosis.   5. The inferior vena cava is normal in size with greater than 50%  respiratory variability, suggesting right atrial pressure of 3 mmHg.    Echocardiogram: 05/17/2022:  Normal LV systolic function with visual EF 55-60%. Left ventricle cavity is normal in size. Normal global wall motion. Normal diastolic filling pattern, normal LAP.  Left atrial cavity is normal in size. An atrial septal aneurysm without a patent foramen ovale is present. Moderate (Grade III) aortic regurgitation. Compared to 04/21/2021 no significant change.   Stress Testing: Exercise Myoview stress test 04/27/2021: 1 Day Rest/Stress Protocol. Exercise time 8 minutes 44seconds on Bruce protocol, achieved 10.16 METS, and 86% of APMHR. Stress ECG negative for ischemia.  Normal myocardial perfusion without evidence of reversible ischemia or prior infarct. Stress LVEF 64%. All segments of the LV demonstrate normal wall motion andthickening. No prior studies available for comparison.   Heart Catheterization: 09/07/2015: The left ventricular systolic function is normal. Mild nonobstructive CAD. Left coronary system does vasodilate with intrcoronary nitroglycerin .   Continue aggressive preventive therapy.  She could have coronary vasospasm.  Consider adjusting medical therapy accordingly.  Will give a prescription of SL NTG.  Risk Assessment/Calculations:   {Does this patient have ATRIAL FIBRILLATION?[phone]} No BP recorded.  {Refresh Note OR Click here to enter BP  :1}***       Physical Exam:   VS:  There were no vitals taken for this visit.   Orhtostatics: No data found. Wt Readings from Last 3 Encounters:  09/30/24 80 lb (36.3 kg)  09/08/24 79 lb 12.8 oz (36.2 kg)  07/03/24 79 lb 6.4 oz (36 kg)    GEN: Well nourished, well developed in no acute distress NECK: No  JVD; No carotid bruits CARDIAC: ***RRR, no murmurs, rubs, gallops RESPIRATORY:  Clear to auscultation without rales, wheezing or rhonchi  ABDOMEN: Soft, non-tender, non-distended EXTREMITIES:  No edema; No deformity   ASSESSMENT AND PLAN: .    Preop clearance for colonoscopy for Dr. Rollin 10/02/24-patient having exertional chest pain and DOE. Need to order NST prior to clearing her for surgery.  According to the Revised Cardiac Risk Index (RCRI), her Perioperative Risk of Major Cardiac Event is (%): 0.9   Her Functional Capacity in METs is: 5.72 according to the [facility] Activity Status Index (DASI).     CAD with mild nonobstructive CAD and coronary spasm on cath 2016-now with exertional angina. Check lexiscan  myoview -on lipitor-check FLP   Moderate AI on echo 07/2024-similar to last echo    COPD-continues to smoke-smoking cessation advised.          {Are you ordering a CV Procedure (e.g. stress test, cath, DCCV, TEE, etc)?   Press F2        :[id number]}  Dispo: ***  Signed, Olivia Pavy, PA-C

## 2024-10-09 ENCOUNTER — Other Ambulatory Visit: Payer: Self-pay | Admitting: Physician Assistant

## 2024-10-09 DIAGNOSIS — R0789 Other chest pain: Secondary | ICD-10-CM

## 2024-10-13 ENCOUNTER — Ambulatory Visit (HOSPITAL_COMMUNITY)
Admission: RE | Admit: 2024-10-13 | Discharge: 2024-10-13 | Disposition: A | Discharge status: Home / Self Care | Source: Ambulatory Visit | Attending: Cardiology | Admitting: Cardiology

## 2024-10-13 DIAGNOSIS — R0789 Other chest pain: Secondary | ICD-10-CM | POA: Diagnosis present

## 2024-10-13 LAB — MYOCARDIAL PERFUSION IMAGING
LV dias vol: 62 mL (ref 46–106)
LV sys vol: 19 mL (ref 3.8–5.2)
Nuc Stress EF: 69 %
Peak HR: 90 {beats}/min
Rest HR: 68 {beats}/min
Rest Nuclear Isotope Dose: 10.1 mCi
SDS: 1
SRS: 0
SSS: 1
ST Depression (mm): 0 mm
Stress Nuclear Isotope Dose: 32.1 mCi
TID: 1.05

## 2024-10-13 LAB — LIPID PANEL
Chol/HDL Ratio: 1.8 ratio (ref 0.0–4.4)
Cholesterol, Total: 153 mg/dL (ref 100–199)
HDL: 85 mg/dL (ref >39)
LDL Chol Calc (NIH): 57 mg/dL (ref 0–99)
Triglycerides: 50 mg/dL (ref 0–149)
VLDL Cholesterol Cal: 11 mg/dL (ref 5–40)

## 2024-10-13 MED ORDER — TECHNETIUM TC 99M TETROFOSMIN IV KIT
10.1000 | PACK | Freq: Once | INTRAVENOUS | Status: AC | PRN
  Administered 2024-10-13: 10.1 via INTRAVENOUS

## 2024-10-13 MED ORDER — REGADENOSON 0.4 MG/5ML IV SOLN
0.4000 mg | Freq: Once | INTRAVENOUS | Status: AC
  Administered 2024-10-13: 0.4 mg via INTRAVENOUS

## 2024-10-13 MED ORDER — REGADENOSON 0.4 MG/5ML IV SOLN
INTRAVENOUS | Status: AC
  Filled 2024-10-13: qty 5

## 2024-10-13 MED ORDER — TECHNETIUM TC 99M TETROFOSMIN IV KIT
32.1000 | PACK | Freq: Once | INTRAVENOUS | Status: AC | PRN
  Administered 2024-10-13: 32.1 via INTRAVENOUS

## 2024-10-14 ENCOUNTER — Ambulatory Visit: Payer: Self-pay | Admitting: Physician Assistant

## 2024-10-16 ENCOUNTER — Encounter (HOSPITAL_COMMUNITY): Payer: Self-pay

## 2024-10-16 ENCOUNTER — Ambulatory Visit (HOSPITAL_COMMUNITY)
Admission: RE | Admit: 2024-10-16 | Discharge: 2024-10-16 | Disposition: A | Discharge status: Home / Self Care | Source: Ambulatory Visit | Attending: Family Medicine | Admitting: Family Medicine

## 2024-10-16 VITALS — BP 111/65 | HR 86 | Temp 98.2°F | Resp 17

## 2024-10-16 DIAGNOSIS — Z206 Contact with and (suspected) exposure to human immunodeficiency virus [HIV]: Secondary | ICD-10-CM | POA: Insufficient documentation

## 2024-10-16 DIAGNOSIS — Z113 Encounter for screening for infections with a predominantly sexual mode of transmission: Secondary | ICD-10-CM | POA: Diagnosis present

## 2024-10-16 LAB — HIV ANTIBODY (ROUTINE TESTING W REFLEX): HIV Screen 4th Generation wRfx: NONREACTIVE

## 2024-10-16 MED ORDER — BIKTARVY 50-200-25 MG PO TABS: 1.0000 | ORAL_TABLET | Freq: Every day | ORAL | 0 refills | Status: DC

## 2024-10-16 NOTE — Discharge Instructions (Addendum)
-   We will call you with results of your labs - Please take 1 tablet of Biktarvy once a day for 28 days - Follow-up with your PCP

## 2024-10-16 NOTE — ED Triage Notes (Signed)
 Pt partner of 1.5 years recently found out at physical that he was HIV positive. Pt requesting testing.

## 2024-10-16 NOTE — ED Provider Notes (Signed)
 [facility]    CSN: [id number] Arrival date & time: 10/16/24  1410      History   Chief Complaint Chief Complaint  Patient presents with   SEXUALLY TRANSMITTED DISEASE    HIV test - Entered by patient    HPI Candace Middleton is a 63 y.o. female.   HPI  Partner of 1.5 years is positive with HIV, just found out Tuesday. Sexual relations with partner. No IV drug use.  Most recent intercourse 10/12/2024, approximately 5 days ago. No recent sore throat/fever or illness in the last couple of months.  Past Medical History:  Diagnosis Date   Allergy     Anxiety    Arthritis    Cancer (HCC)    bladder ca   COPD (chronic obstructive pulmonary disease) (HCC)    Coronary artery disease    Depression    GERD (gastroesophageal reflux disease)    History of kidney stones    Hyperlipidemia    Neuromuscular disorder (HCC) 2014   RSD - nerve blocks in back for treatment    Osteoporosis 09/05/2019    Patient Active Problem List   Diagnosis Date Noted   SOB (shortness of breath) 04/15/2024   Severe sepsis (HCC) 04/15/2024   Leukocytosis 04/15/2024   Acute hypoxic respiratory failure (HCC) 04/15/2024   CAP (community acquired pneumonia) 04/14/2024   Osteoporosis 09/05/2019   COPD with acute exacerbation (HCC) 02/05/2018   Depression 02/05/2018   Coronary artery vasospasm 09/22/2015   Precordial pain    Coronary artery disease 09/01/2015   HLD (hyperlipidemia) 09/01/2015   Tobacco abuse 09/01/2015   History of bladder cancer 04/26/2015   History of tobacco use 06/24/2013   RSD (reflex sympathetic dystrophy) 06/24/2013   Complex regional pain syndrome of left lower extremity 06/24/2013   Neuromuscular disorder (HCC) 2014    Past Surgical History:  Procedure Laterality Date   bladder cancer  2006   BLADDER TUMOR EXCISION  12/25/2004   BREAST SURGERY  long time ago   lumpectomy    CARDIAC CATHETERIZATION N/A 09/07/2015   Procedure: Left Heart Cath and  Coronary Angiography;  Surgeon: Candyce GORMAN Reek, MD;  Location: [facility] INVASIVE CV LAB;  Service: Cardiovascular;  Laterality: N/A;   CESAREAN SECTION  1982   CYSTOSCOPY WITH BIOPSY N/A 09/26/2023   Procedure: CYSTOSCOPY WITH BLADDER  BIOPSY;  Surgeon: Alvaro Ricardo KATHEE Mickey., MD;  Location: [facility];  Service: Urology;  Laterality: N/A;  45 MINS FOR CASE   CYSTOSCOPY/URETEROSCOPY/HOLMIUM LASER/STENT PLACEMENT Left 09/26/2023   Procedure: CYSTOSCOPY LEFT RETROGRADE PYELOGRAM/URETEROSCOPY/BASKETING OF STONE;  Surgeon: Alvaro Ricardo KATHEE Mickey., MD;  Location: [facility];  Service: Urology;  Laterality: Left;   ENDOMETRIAL ABLATION  12/25/1993    OB History   No obstetric history on file.      Home Medications    Prior to Admission medications   Medication Sig Start Date End Date Taking? Authorizing Provider  bictegravir-emtricitabine-tenofovir AF (BIKTARVY) 50-200-25 MG TABS tablet Take 1 tablet by mouth daily for 28 days. 10/16/24 11/13/24 Yes Howell Lunger, DO  albuterol  (PROVENTIL ) (2.5 MG/3ML) 0.083% nebulizer solution Take 3 mLs (2.5 mg total) by nebulization every 6 (six) hours as needed for wheezing or shortness of breath. 09/08/24   Hope Almarie ORN, NP  albuterol  (VENTOLIN  HFA) 108 (90 Base) MCG/ACT inhaler Inhale 2 puffs into the lungs every 6 (six) hours as needed.    [provider]  amLODipine  (NORVASC ) 2.5 MG tablet Take 1 tablet (2.5 mg total) by mouth  at bedtime. 09/30/24   Parthenia Olivia HERO, PA-C  aspirin  EC 81 MG tablet Take 1 tablet (81 mg total) by mouth daily. 08/13/15   Marcus Annabella RAMAN, PA-C  atorvastatin  (LIPITOR) 20 MG tablet TAKE 1 TABLET BY MOUTH EVERY DAY Patient taking differently: Take 20 mg by mouth every evening. 06/22/20   Cresenzo, John V, MD  budeson-glycopyrrolate -formoterol  (BREZTRI ) 160-9-4.8 MCG/ACT AERO inhaler Inhale 2 puffs into the lungs 2 (two) times daily.    [provider]  budesonide -glycopyrrolate -formoterol  (BREZTRI  AEROSPHERE) 160-9-4.8  MCG/ACT AERO inhaler Inhale 2 puffs into the lungs in the morning and at bedtime. Patient not taking: Reported on 09/30/2024 07/03/24   Geronimo Amel, MD  Ensifentrine  (OHTUVAYRE ) 3 MG/2.5ML SUSP Inhale 3 mg into the lungs in the morning and at bedtime. Patient not taking: Reported on 09/30/2024 09/12/24   Hope Almarie ORN, NP  metoprolol  succinate (TOPROL -XL) 25 MG 24 hr tablet TAKE 1/2 TABLET(12.5 MG) BY MOUTH EVERY MORNING 04/08/24   Tolia, Sunit, DO  nicotine  (NICODERM CQ  - DOSED IN MG/24 HOURS) 14 mg/24hr patch Place 1 patch (14 mg total) onto the skin daily as needed (smoking cessation). Patient not taking: Reported on 09/30/2024 04/18/24   Dennise Lavada POUR, MD  predniSONE  (DELTASONE ) 10 MG tablet Take 4 tabs by mouth daily for 3 days, then 3 tabs for 3 days, then 2 tabs for 2 days, then 1 tab for 2 days 09/24/24   Johnie Rumaldo LABOR, NP    Family History Family History  Problem Relation Age of Onset   Heart disease Mother    Early death Father    Pulmonary embolism Father    Thyroid  disease Sister    Melanoma Sister    Diabetes Brother    Thyroid  disease Brother    Epilepsy Son    Colon cancer Neg Hx     Social History Social History   Tobacco Use   Smoking status: Every Day    Current packs/day: 0.50    Average packs/day: 0.5 packs/day for 30.0 years (15.0 ttl pk-yrs)    Types: Cigarettes   Smokeless tobacco: Never   Tobacco comments:    Pt smokes less than 0.5 ppd. AB, CMA 09-08-2024  Vaping Use   Vaping status: Never Used  Substance Use Topics   Alcohol  use: No   Drug use: No     Allergies   Gabapentin  and Sulfa antibiotics   Review of Systems Review of Systems   Physical Exam Triage Vital Signs ED Triage Vitals  Encounter Vitals Group     BP 10/16/24 1423 111/65     Girls Systolic BP Percentile --      Girls Diastolic BP Percentile --      Boys Systolic BP Percentile --      Boys Diastolic BP Percentile --      Pulse Rate 10/16/24 1423 86      Resp 10/16/24 1423 17     Temp 10/16/24 1423 98.2 F (36.8 C)     Temp Source 10/16/24 1423 Oral     SpO2 10/16/24 1423 95 %     Weight --      Height --      Head Circumference --      Peak Flow --      Pain Score 10/16/24 1422 0     Pain Loc --      Pain Education --      Exclude from Growth Chart --    No data found.  Updated  Vital Signs BP 111/65 (BP Location: Left Arm)   Pulse 86   Temp 98.2 F (36.8 C) (Oral)   Resp 17   SpO2 95%   Visual Acuity Right Eye Distance:   Left Eye Distance:   Bilateral Distance:    Right Eye Near:   Left Eye Near:    Bilateral Near:     Physical Exam Exam conducted with a chaperone present Rick PEAK).  Cardiovascular:     Rate and Rhythm: Normal rate and regular rhythm.     Heart sounds: Normal heart sounds.  Pulmonary:     Effort: Pulmonary effort is normal.     Breath sounds: Normal breath sounds.  Abdominal:     General: Abdomen is flat. Bowel sounds are normal.     Palpations: Abdomen is soft.  Genitourinary:    General: Normal vulva.     Pubic Area: No rash.      Labia:        Right: No lesion.        Left: No lesion.       UC Treatments / Results  Labs (all labs ordered are listed, but only abnormal results are displayed) Labs Reviewed  HIV ANTIBODY (ROUTINE TESTING W REFLEX)  RPR  HCV RNA QUANT  CERVICOVAGINAL ANCILLARY ONLY    EKG   Radiology No results found.  Procedures Procedures (including critical care time)  Medications Ordered in UC Medications - No data to display  Initial Impression / Assessment and Plan / UC Course  I have reviewed the triage vital signs and the nursing notes.  Pertinent labs & imaging results that were available during my care of the patient were reviewed by me and considered in my medical decision making (see chart for details).     63 year old well-appearing afebrile female presenting with exposure (sexual intercourse) with partner recently diagnosed 10/21 with  HIV.  This is her partner of 1.5 years, do not know exact time of HIV contraction, but multiple sexual encounters over that time.  Most recent vaginal intercourse Sunday 10/19.  No recent illness/systemic symptoms over the last couple months.  Will perform full STI screening today.  Briefly spoke with on-call infectious disease, Dr. Fleeta Rothman.  Discussed case and recommendations for testing/PEP.  ID recommends condition to STI/HIV antibody testing to also obtain HIV RNA.  Additionally, with exposure within the last 7 days reasonable to discuss risks and benefits of starting PEP with patient. After discussion with patient, and review of most recent laboratory (normal renal function July 2025) using shared decision making we will initiate PEP with Biktarvy for 28 days with close PCP follow-up.  Recommend liver function testing and hepatitis B testing at PCP office. Discussed if HIV positive, she will be established with infectious disease.  If she is HIV negative, recommend her primary care follow PEP protocol with future retesting of HIV and consideration of starting PrEP after PEP completion as she plans to be with her partner long-term.   Final Clinical Impressions(s) / UC Diagnoses   Final diagnoses:  HIV exposure  Screening examination for STI     Discharge Instructions      - We will call you with results of your labs - Please take 1 tablet of Biktarvy once a day for 28 days - Follow-up with your PCP     ED Prescriptions     Medication Sig Dispense Auth. Provider   bictegravir-emtricitabine-tenofovir AF (BIKTARVY) 50-200-25 MG TABS tablet Take 1 tablet by mouth daily  for 28 days. 28 tablet Howell Lunger, DO      PDMP not reviewed this encounter.   Howell Lunger, [place] 10/16/24 1549

## 2024-10-17 LAB — HCV RNA QUANT: HCV Quantitative: NOT DETECTED [IU]/mL (ref >50)

## 2024-10-17 LAB — RPR: RPR Ser Ql: NONREACTIVE

## 2024-10-20 ENCOUNTER — Telehealth (HOSPITAL_COMMUNITY): Payer: Self-pay | Admitting: Family Medicine

## 2024-10-20 ENCOUNTER — Ambulatory Visit: Admitting: Physician Assistant

## 2024-10-20 LAB — CERVICOVAGINAL ANCILLARY ONLY
Chlamydia: NEGATIVE
Comment: NEGATIVE
Comment: NEGATIVE
Comment: NORMAL
Neisseria Gonorrhea: NEGATIVE
Trichomonas: NEGATIVE

## 2024-10-20 MED ORDER — BIKTARVY 50-200-25 MG PO TABS: 1.0000 | ORAL_TABLET | Freq: Every day | ORAL | 0 refills | Status: AC

## 2024-10-20 NOTE — Telephone Encounter (Signed)
 Apparently insurance is not covering Biktarvy due to the quantity being 28 instead of 30 tablets.  New prescription sent this morning.

## 2024-10-23 NOTE — Telephone Encounter (Signed)
 Received fax from [employer] stating the pt has been approved for patient assistance and needs an rx sent in to be forwarded to pharmacy for PAP. Filled out rx form and faxed with med list to [employer].

## 2024-10-26 NOTE — Progress Notes (Unsigned)
 @Patient  ID: Candace Middleton, female    DOB: 08-18-1961, 63 y.o.   MRN: [id number]  No chief complaint on file.   Referring provider: Alec House, MD  HPI:  63 year old female, current smoker. PMH significant for CAD, COPD, CAP, complex pain syndrome, osteoporosis, hx bladder cancer, tobacco use.   Previous [facility] pulmonary encounter: 07/03/2024 -   Chief Complaint  Patient presents with   Consult    PCP referred. Pt has COPD and was admitted to the hospital with pneumonia and severe sepsis. SOB that gets worse with exertion.  Pt states that at home her o2 gets down to the 80%s after exertion.    Candace Middleton 63 y.o. -admmitted  [id number] - 04/18/24 for CAP Pneumococall RLL. Seen by ID recalls getting sick around April 02, 2024 going to urgent care getting antibiotics and prednisone  but then getting worse and then having rib fracture on the left side and then getting hospitalized.  Since discharge she is cut down on smoking but still continues to smoke.  Since admission she has improved a lot she is doing her ADLs but still is experiencing desaturations when she exercises [she does not have home oxygen and she was not aware of this preadmission] she finds it difficult to vacuum.  Her physical conditioning is almost back to baseline.  But she notes that she has ways to go.  She also wants to quit smoking.  She is on Breztri  which is helping her.  She wants albuterol  refilled.  She wants to know how bad her COPD is and the implications of having COPD.  She is lost 40 pounds of weight in the last 1 year which she attributes to taking care of her sibling and parent  Based on symptoms of fatigue, shortness of breath and cough and exercise hypoxemia.  1. Chronic obstructive pulmonary disease, unspecified COPD type (HCC)  J44.9       2. DOE (dyspnea on exertion)  R06.09       3. Exercise hypoxemia  R09.02       4. Hospital discharge follow-up  Z09       5. History of pneumococcal  pneumonia  Z87.01       6. History of acute respiratory failure  Z87.09         Definitely respiratory/emphysema.  Severity needs to be staged but good news you do not desaturate with exertion     Plan  - check alpha 1 Anti Trypsin Phenotype blood work - do QUantiferon Gold TB blood test - do cbc with diff - if eosinophil high - can consider biolotic  - do  ECHO - do HRCT supine and prone in 2 months -do full PFT   -xx - continue Breztri   2 puff twice daily  - continue albuterol  neb as needed             - CMA to do refill    - do ONO on room air   - QUIT SMOKING             - see place to contac below     FOllowup 6-8 weeks with aPP but after completing all of above   09/08/2024- INTERIM HX Discussed the use of AI scribe software for clinical note transcription with the patient, who gave verbal consent to proceed.  History of Present Illness Candace Middleton is a 63 year old female with COPD and emphysema who presents for a follow-up after initial  consultation and diagnostic testing. She was referred by Dr. Geronimo for follow-up after initial consultation and diagnostic testing.  She was hospitalized in April for community-acquired pneumonia. Prior to hospitalization, she visited urgent care and received antibiotics and prednisone , but her symptoms worsened, leading to a rib fracture from coughing. Since discharge, she has reduced smoking but continues to smoke and is working on quitting. She is currently using Breztri  inhaler, which she feels is helping.  She has a history of COPD and emphysema, with recent diagnostic tests including a CT scan showing emphysema. Her lung function test showed 43% predicted lung function with some reversibility after bronchodilator use.  She experiences fatigue, shortness of breath, cough, and exercise-induced hypoxemia. Her main respiratory symptoms include exertional shortness of breath, wheezing, chest tightness, and a morning cough  that is 'crappy but nothing, I can't ever get anything up.' She uses Breztri  inhaler twice daily and has albuterol  inhaler and nebulizer for severe episodes, which she tries to use sparingly.  Her echocardiogram in August showed normal ejection fraction and no evidence of heart failure, with some mild calcification of the aortic valve. She is on Lipitor for cholesterol management and takes it consistently.  She has a family history of lung cancer, as her husband died from it seven years ago. She has a cat but reports no significant allergy  symptoms. She has not had childhood asthma or exercise-induced asthma symptoms as a child. She has had two to three flare-ups per year requiring prednisone  but has not been hospitalized for these exacerbations since the initial pneumonia episode.   09/08/2024 FENO>> 8   No results found for: NITRICOXIDE This result suggests low (<25) Type 2 (T2) airway inflammation indicating a low likelihood of active T2-driven airway inflammation; reduced probability of response to inhaled corticosteroids.      10/27/2024- Interim Discussed the use of AI scribe software for clinical note transcription with the patient, who gave verbal consent to proceed.  History of Present Illness   3-4 month fu Last seen in September    Allergies  Allergen Reactions   Gabapentin  Hives    Patient reports facial swelling, hives, facial involvement    Sulfa Antibiotics Anaphylaxis    Onset at age 19, almost died because I stopped breathing.    Immunization History  Administered Date(s) Administered   INFLUENZA, HIGH DOSE SEASONAL PF 09/08/2024   Influenza,inj,Quad PF,6+ Mos 09/30/2015, 09/25/2018, 10/01/2019   Influenza-Unspecified 09/24/2017   PNEUMOCOCCAL CONJUGATE-20 09/08/2024   Pneumococcal Polysaccharide-23 10/01/2019   Tdap 10/30/2018    Past Medical History:  Diagnosis Date   Allergy     Anxiety    Arthritis    Cancer (HCC)    bladder ca   COPD (chronic  obstructive pulmonary disease) (HCC)    Coronary artery disease    Depression    GERD (gastroesophageal reflux disease)    History of kidney stones    Hyperlipidemia    Neuromuscular disorder (HCC) 2014   RSD - nerve blocks in back for treatment    Osteoporosis 09/05/2019    Tobacco History: Social History   Tobacco Use  Smoking Status Every Day   Current packs/day: 0.50   Average packs/day: 0.5 packs/day for 30.0 years (15.0 ttl pk-yrs)   Types: Cigarettes  Smokeless Tobacco Never  Tobacco Comments   Pt smokes less than 0.5 ppd. AB, CMA 09-08-2024   Ready to quit: Not Answered Counseling given: Not Answered Tobacco comments: Pt smokes less than 0.5 ppd. AB, CMA 09-08-2024   Outpatient  Medications Prior to Visit  Medication Sig Dispense Refill   albuterol  (PROVENTIL ) (2.5 MG/3ML) 0.083% nebulizer solution Take 3 mLs (2.5 mg total) by nebulization every 6 (six) hours as needed for wheezing or shortness of breath. 75 mL 5   albuterol  (VENTOLIN  HFA) 108 (90 Base) MCG/ACT inhaler Inhale 2 puffs into the lungs every 6 (six) hours as needed.     amLODipine  (NORVASC ) 2.5 MG tablet Take 1 tablet (2.5 mg total) by mouth at bedtime. 90 tablet 3   aspirin  EC 81 MG tablet Take 1 tablet (81 mg total) by mouth daily. 30 tablet 3   atorvastatin  (LIPITOR) 20 MG tablet TAKE 1 TABLET BY MOUTH EVERY DAY (Patient taking differently: Take 20 mg by mouth every evening.) 90 tablet 3   bictegravir-emtricitabine-tenofovir AF (BIKTARVY) 50-200-25 MG TABS tablet Take 1 tablet by mouth daily. 30 tablet 0   budeson-glycopyrrolate -formoterol  (BREZTRI ) 160-9-4.8 MCG/ACT AERO inhaler Inhale 2 puffs into the lungs 2 (two) times daily.     budesonide -glycopyrrolate -formoterol  (BREZTRI  AEROSPHERE) 160-9-4.8 MCG/ACT AERO inhaler Inhale 2 puffs into the lungs in the morning and at bedtime. (Patient not taking: Reported on 09/30/2024)     Ensifentrine  (OHTUVAYRE ) 3 MG/2.5ML SUSP Inhale 3 mg into the lungs in the  morning and at bedtime. (Patient not taking: Reported on 09/30/2024)     metoprolol  succinate (TOPROL -XL) 25 MG 24 hr tablet TAKE 1/2 TABLET(12.5 MG) BY MOUTH EVERY MORNING 15 tablet 0   nicotine  (NICODERM CQ  - DOSED IN MG/24 HOURS) 14 mg/24hr patch Place 1 patch (14 mg total) onto the skin daily as needed (smoking cessation). (Patient not taking: Reported on 09/30/2024) 28 patch 0   predniSONE  (DELTASONE ) 10 MG tablet Take 4 tabs by mouth daily for 3 days, then 3 tabs for 3 days, then 2 tabs for 2 days, then 1 tab for 2 days 28 tablet 0   No facility-administered medications prior to visit.      Review of Systems  Review of Systems   Physical Exam  There were no vitals taken for this visit. Physical Exam  ***  Lab Results:  CBC    Component Value Date/Time   WBC 7.0 07/17/2024 1149   RBC 4.79 07/17/2024 1149   HGB 15.1 (H) 07/17/2024 1149   HGB 15.3 09/27/2018 1048   HCT 45.3 07/17/2024 1149   HCT 46.9 (H) 09/27/2018 1048   PLT 290 07/17/2024 1149   PLT 307 09/27/2018 1048   MCV 94.6 07/17/2024 1149   MCV 91 09/27/2018 1048   MCH 31.5 07/17/2024 1149   MCHC 33.3 07/17/2024 1149   RDW 14.2 07/17/2024 1149   RDW 13.0 09/27/2018 1048   LYMPHSABS 2.4 07/03/2024 1121   LYMPHSABS 3.1 02/04/2018 1429   MONOABS 0.8 07/03/2024 1121   EOSABS 0.1 07/03/2024 1121   EOSABS 0.1 02/04/2018 1429   BASOSABS 0.0 07/03/2024 1121   BASOSABS 0.0 02/04/2018 1429    BMET    Component Value Date/Time   NA 139 07/17/2024 1149   NA 140 09/27/2018 1048   K 3.8 07/17/2024 1149   CL 106 07/17/2024 1149   CO2 24 07/17/2024 1149   GLUCOSE 104 (H) 07/17/2024 1149   BUN 11 07/17/2024 1149   BUN 6 09/27/2018 1048   CREATININE 0.60 07/17/2024 1149   CREATININE 0.64 09/01/2015 1532   CALCIUM  9.2 07/17/2024 1149   GFRNONAA >60 07/17/2024 1149   GFRNONAA >89 09/01/2015 1532   GFRAA 108 09/27/2018 1048   GFRAA >89 09/01/2015 1532  BNP    Component Value Date/Time   BNP 196.4 (H)  04/18/2024 0430    ProBNP No results found for: PROBNP  Imaging: MYOCARDIAL PERFUSION/CT RAD READ Result Date: 10/14/2024 CLINICAL DATA:  This over-read does not include interpretation of cardiac or coronary anatomy or pathology. The cardiac SPECT CT interpretation by the cardiologist is attached. COMPARISON:  August 27, 2024 FINDINGS: Vascular: Aortic atherosclerosis. Mediastinum/Nodes: No pathologically enlarged mediastinal lymph nodes. Lungs/Pleura: Emphysema.  Motion degraded examination of the lungs. Upper Abdomen: No acute finding. Musculoskeletal: Thoracic spondylosis. IMPRESSION: No acute extracardiac findings. Aortic Atherosclerosis (ICD10-I70.0) and Emphysema (ICD10-J43.9). Electronically Signed   By: Reyes Holder M.D.   On: 10/14/2024 07:07   MYOCARDIAL PERFUSION IMAGING Result Date: 10/13/2024   The study is normal. The study is low risk.   LV perfusion is normal. There is no evidence of ischemia. There is no evidence of infarction.   Left ventricular function is normal. Nuclear stress EF: 69%. The left ventricular ejection fraction is hyperdynamic (>65%). End diastolic cavity size is normal.   Coronary calcium  was present on the attenuation correction CT images. Mild coronary calcifications were present. Coronary calcifications were present in the right coronary artery distribution(s).     Assessment & Plan:   No problem-specific Assessment & Plan notes found for this encounter.   There are no diagnoses linked to this encounter.  Assessment and Plan Assessment & Plan       I personally spent a total of *** minutes in the care of the patient today including {Time Based Coding:[id number]}.   Candace LELON Ferrari, NP 10/26/2024

## 2024-10-27 ENCOUNTER — Ambulatory Visit: Payer: Self-pay | Admitting: Primary Care

## 2024-10-27 ENCOUNTER — Ambulatory Visit (INDEPENDENT_AMBULATORY_CARE_PROVIDER_SITE_OTHER): Admitting: Primary Care

## 2024-10-27 ENCOUNTER — Encounter: Payer: Self-pay | Admitting: Primary Care

## 2024-10-27 ENCOUNTER — Ambulatory Visit (INDEPENDENT_AMBULATORY_CARE_PROVIDER_SITE_OTHER)

## 2024-10-27 VITALS — BP 98/62 | HR 70 | Ht <= 58 in | Wt 76.8 lb

## 2024-10-27 DIAGNOSIS — J441 Chronic obstructive pulmonary disease with (acute) exacerbation: Secondary | ICD-10-CM

## 2024-10-27 DIAGNOSIS — J302 Other seasonal allergic rhinitis: Secondary | ICD-10-CM

## 2024-10-27 DIAGNOSIS — R7689 Other specified abnormal immunological findings in serum: Secondary | ICD-10-CM

## 2024-10-27 DIAGNOSIS — J449 Chronic obstructive pulmonary disease, unspecified: Secondary | ICD-10-CM

## 2024-10-27 NOTE — Patient Instructions (Addendum)
  VISIT SUMMARY: Today, you had a follow-up visit to discuss your severe COPD with emphysema and recurrent flare-ups. We reviewed your symptoms, recent test results, and current medications. We also discussed your seasonal allergies and the elevated IgE levels found in your allergy  testing.  YOUR PLAN: -CHRONIC OBSTRUCTIVE PULMONARY DISEASE (COPD) WITH EMPHYSEMA AND RECURRENT EXACERBATIONS: COPD is a chronic lung disease that makes it hard to breathe, and emphysema is a type of COPD that damages the air sacs in your lungs. You have been experiencing symptoms like shortness of breath, wheezing, chest tightness, and a morning cough. We will start you on Ohtuvayre  (encrinetrine) 3 mg nebulized twice daily to help reduce inflammation and prevent flare-ups. Continue Breztri  aerosphere two puffs twice daily. Continue using your albuterol  nebulizer every 6-8 hours as needed for acute symptoms and take Mucinex  to help clear mucus. We have ordered a chest x-ray to check your low oxygen levels and referred you to an allergist for further evaluation. Please monitor for fever, colored mucus, or prolonged symptoms and contact us  if these occur.  -SEASONAL ALLERGIC RHINITIS WITH ELEVATED IGE: Seasonal allergic rhinitis is an allergic reaction that causes symptoms like congestion and difficulty clearing mucus, often triggered by allergens. Your IgE levels are elevated, which suggests an allergic response, although no specific allergens were identified. We have referred you to an allergist for skin testing to identify specific allergens. Continue taking montelukast (Singulair) to manage your allergy  symptoms.  INSTRUCTIONS: Please follow up with the allergist for further evaluation and skin testing. Monitor your symptoms and contact us  if you experience fever, colored mucus, or prolonged symptoms. Your next CT scan for lung cancer screening is due in September 2026.  Follow-up 8-12 weeks with Dr. Chiquita or Landry NP

## 2024-10-27 NOTE — Progress Notes (Signed)
 Pt aware, verbalized understanding. Nothing farther needed.

## 2024-10-27 NOTE — Progress Notes (Signed)
 Please let patient know CXR showed no acute findings, lung hyperinflated consistent with COPD/emphysema. Otherwise clear

## 2024-11-12 ENCOUNTER — Encounter: Payer: Self-pay | Admitting: Cardiology

## 2024-11-12 ENCOUNTER — Ambulatory Visit: Attending: Cardiology | Admitting: Cardiology

## 2024-11-12 VITALS — BP 126/68 | HR 69 | Resp 16 | Ht <= 58 in | Wt 77.4 lb

## 2024-11-12 DIAGNOSIS — Z01818 Encounter for other preprocedural examination: Secondary | ICD-10-CM

## 2024-11-12 DIAGNOSIS — I359 Nonrheumatic aortic valve disorder, unspecified: Secondary | ICD-10-CM

## 2024-11-12 DIAGNOSIS — I2510 Atherosclerotic heart disease of native coronary artery without angina pectoris: Secondary | ICD-10-CM

## 2024-11-12 DIAGNOSIS — R072 Precordial pain: Secondary | ICD-10-CM

## 2024-11-12 DIAGNOSIS — R0602 Shortness of breath: Secondary | ICD-10-CM

## 2024-11-12 NOTE — Patient Instructions (Signed)
 Medication Instructions:  Your physician recommends that you continue on your current medications as directed. Please refer to the Current Medication list given to you today.  *If you need a refill on your cardiac medications before your next appointment, please call your pharmacy*  Lab Work: None ordered If you have labs (blood work) drawn today and your tests are completely normal, you will receive your results only by: MyChart Message (if you have MyChart) OR A paper copy in the mail If you have any lab test that is abnormal or we need to change your treatment, we will call you to review the results.  Testing/Procedures: ECHOCARDIOGRAM in AUG. 2026  Follow-Up: At [facility], you and your health needs are our priority.  As part of our continuing mission to provide you with exceptional heart care, our providers are all part of one team.  This team includes your primary Cardiologist (physician) and Advanced Practice Providers or APPs (Physician Assistants and Nurse Practitioners) who all work together to provide you with the care you need, when you need it.  Your next appointment:   OCT 2026  Provider:   Madonna Large, DO    We recommend signing up for the patient portal called MyChart.  Sign up information is provided on this After Visit Summary.  MyChart is used to connect with patients for Virtual Visits (Telemedicine).  Patients are able to view lab/test results, encounter notes, upcoming appointments, etc.  Non-urgent messages can be sent to your provider as well.   To learn more about what you can do with MyChart, go to [email or web address].   Other Instructions Your physician has requested that you have an echocardiogram. Echocardiography is a painless test that uses sound waves to create images of your heart. It provides your doctor with information about the size and shape of your heart and how well your heart's chambers and valves are working. This procedure takes  approximately one hour. There are no restrictions for this procedure. Please do NOT wear cologne, perfume, aftershave, or lotions (deodorant is allowed). Please arrive 15 minutes prior to your appointment time.  Please note: We ask at that you not bring children with you during ultrasound (echo/ vascular) testing. Due to room size and safety concerns, children are not allowed in the ultrasound rooms during exams. Our front office staff cannot provide observation of children in our lobby area while testing is being conducted. An adult accompanying a patient to their appointment will only be allowed in the ultrasound room at the discretion of the ultrasound technician under special circumstances. We apologize for any inconvenience.

## 2024-11-12 NOTE — Progress Notes (Signed)
 Cardiology Office Note:  .   Date:  11/12/2024  ID:  Candace Middleton, DOB [birth year], MRN [id number] PCP:  Medicine, Candace Middleton  Former Cardiology Middleton: Dr. Debby Middleton  Candace Middleton Cardiologist:  Candace Middleton , Candace Middleton (established care 04/14/2021) Electrophysiologist:  None  Click to update primary MD,subspecialty MD or APP then REFRESH:1}    Chief Complaint  Patient presents with   Coronary atherosclerosis due to calcified coronary lesion   Results   Follow-up    History of Present Illness: .   [name] is a 63 y.o.  female whose past medical history and cardiovascular risk factors includes: Aortic atherosclerosis, calcified coronary atherosclerosis involving the RCA (not gated CT 03/07/2021), history of coronary vasospasm (cath 2016), COPD, family history of heart disease, smoking 0.5ppd and postmenopausal female.   Patient was referred to practice for evaluation of atherosclerotic heart disease.  Given her history of smoking she underwent lung cancer screening and was noted to have coronary artery calcification predominantly in the RCA distribution.  Given her risk factors and symptoms she will underwent echocardiogram and stress test.  During last office visit with myself in July 2023 patient was endorsing precordial pain.  I recommended undergoing coronary CTA for further evaluation but this was never completed.  I also recommended that she works on complete cessation of smoking and also to follow-up with pulmonary medicine for COPD management.  She reached out to cardiology in October 2025 for preprocedural risk stratification for colonoscopy.  Given her symptoms stress test was recommended which was reported to be low risk.  Patient presents today for follow-up.  Chest pain and shortness of breath: Chronic symptoms. They have not increased in intensity frequency or duration. Patient states that her shortness of breath is  probably secondary to her underlying pulmonary condition. Chest pain has been occurring intermittently, likely once a month, duration is a few minutes, occurs randomly (at rest or with activity), and spontaneously resolves. Patient states that his symptoms are very similar to when she had her cath in 2016 and at that time she was noted to have mild nonobstructive disease. Recent stress test was reported to be low risk.  She used to smoke but motivated to quit.  She is down to 0.5 packs/day.  Congratulated on her efforts  Review of Systems: .   Review of Systems  Cardiovascular:  Positive for chest pain (see above). Negative for claudication, irregular heartbeat, leg swelling, near-syncope, orthopnea, palpitations, paroxysmal nocturnal dyspnea and syncope.  Respiratory:  Positive for shortness of breath (chronic and stable).   Hematologic/Lymphatic: Negative for bleeding problem.    Studies Reviewed:   EKG: EKG Interpretation Date/Time:  Wednesday November 12 2024 09:33:54 EST Ventricular Rate:  70 PR Interval:  140 QRS Duration:  76 QT Interval:  408 QTC Calculation: 440 R Axis:   82  Text Interpretation: Normal sinus rhythm Nonspecific ST abnormality Baseline wander When compared with ECG of 17-Jul-2024 11:46, No significant change since last tracing Confirmed by [name] [phone]) on 11/12/2024 9:47:36 AM  Echocardiogram: 08/12/2024  1. Left ventricular ejection fraction, by estimation, is 55 to 60%. The  left ventricle has normal function. The left ventricle has no regional  wall motion abnormalities. Left ventricular diastolic parameters were  normal.   2. Right ventricular systolic function is normal. The right ventricular  size is normal. Tricuspid regurgitation signal is inadequate for assessing  PA pressure.   3. The mitral valve is grossly normal.  Trivial mitral valve  regurgitation. No evidence of mitral stenosis.   4. The aortic valve is tricuspid. There is mild  calcification of the  aortic valve. There is mild thickening of the aortic valve. Aortic valve  regurgitation is moderate. Aortic valve sclerosis is present, with no  evidence of aortic valve stenosis. The aortic root is normal in size and structure.   5. The inferior vena cava is normal in size with greater than 50%  respiratory variability, suggesting right atrial pressure of 3 mmHg.   Stress Testing: MPI 10/13/2024   The study is normal. The study is low risk.   LV perfusion is normal. There is no evidence of ischemia. There is no evidence of infarction.   Left ventricular function is normal. Nuclear stress EF: 69%. The left ventricular ejection fraction is hyperdynamic (>65%). End diastolic cavity size is normal.   Coronary calcium  was present on the attenuation correction CT images. Mild coronary calcifications were present. Coronary calcifications were present in the right coronary artery distribution(s).   RADIOLOGY: CT chest high-resolution 08/2024 1. No evidence of interstitial lung disease or air trapping. 2. Aortic atherosclerosis (ICD10-I70.0). Coronary artery calcification. 3. Emphysema (ICD10-J43.9). Low-dose CT lung cancer screening is recommended for patients who are 64-34 years of age with a 20+ pack-year history of smoking and who are currently smoking or quit <=15 years ago.  Risk Assessment/Calculations:   NA   Labs:    External Labs: Collected: 02/04/2021 provided by PCP Creatinine 0.78 mg/dL. eGFR: 82 mL/min per 1.73 m Sodium 140, potassium 5.7 Lipid profile: Total cholesterol 164, triglycerides 50, HDL 70, LDL 82, non-HDL 94 Hemoglobin A1c: 5.9 TSH: 2.48 Hemoglobin 16.2 g/dL, hematocrit 51%   External Labs: Collected: April 19, 2022: Total cholesterol 164, triglycerides 58, HDL 62, LDL 88, non-HDL 102. BUN 14, creatinine 0.70 Sodium 137, potassium 4.9, chloride 104, bicarb 23, AST 17, ALT 15. Hemoglobin 15.3 g/dL, hematocrit 54.3%. TSH  1.83. A1c 6%     Latest Ref Rng & Units 07/17/2024   11:49 AM 07/03/2024   11:21 AM 04/25/2024    8:46 AM  CBC  WBC 4.0 - 10.5 K/uL 7.0  8.5  13.4   Hemoglobin 12.0 - 15.0 g/dL 84.8  84.5  85.2   Hematocrit 36.0 - 46.0 % 45.3  46.5  46.0   Platelets 150 - 400 K/uL 290  322.0  439        Latest Ref Rng & Units 07/17/2024   11:49 AM 04/25/2024    8:46 AM 04/18/2024    4:30 AM  BMP  Glucose 70 - 99 mg/dL 895  81  883   BUN 8 - 23 mg/dL 11  14  16    Creatinine 0.44 - 1.00 mg/dL 9.39  9.26  9.28   Sodium 135 - 145 mmol/L 139  137  138   Potassium 3.5 - 5.1 mmol/L 3.8  4.0  4.6   Chloride 98 - 111 mmol/L 106  99  105   CO2 22 - 32 mmol/L 24  26  24    Calcium  8.9 - 10.3 mg/dL 9.2  8.9  8.3       Latest Ref Rng & Units 07/17/2024   11:49 AM 04/25/2024    8:46 AM 04/18/2024    4:30 AM  CMP  Glucose 70 - 99 mg/dL 895  81  883   BUN 8 - 23 mg/dL 11  14  16    Creatinine 0.44 - 1.00 mg/dL 9.39  9.26  9.28  Sodium 135 - 145 mmol/L 139  137  138   Potassium 3.5 - 5.1 mmol/L 3.8  4.0  4.6   Chloride 98 - 111 mmol/L 106  99  105   CO2 22 - 32 mmol/L 24  26  24    Calcium  8.9 - 10.3 mg/dL 9.2  8.9  8.3     Lab Results  Component Value Date   CHOL 153 10/13/2024   HDL 85 10/13/2024   LDLCALC 57 10/13/2024   TRIG 50 10/13/2024   CHOLHDL 1.8 10/13/2024   No results for input(s): LIPOA in the last 8760 hours. No components found for: NTPROBNP No results for input(s): PROBNP in the last 8760 hours. No results for input(s): TSH in the last 8760 hours.  Physical Exam:    Today's Vitals   11/12/24 0931  BP: 126/68  Pulse: 69  Resp: 16  SpO2: 94%  Weight: 77 lb 6.4 oz (35.1 kg)  Height: 4' 9 (1.448 m)   Body mass index is 16.75 kg/m. Wt Readings from Last 3 Encounters:  11/12/24 77 lb 6.4 oz (35.1 kg)  10/27/24 76 lb 12.8 oz (34.8 kg)  09/30/24 80 lb (36.3 kg)    Physical Exam  Constitutional: No distress.  hemodynamically stable  Neck: No JVD present.   Cardiovascular: Normal rate, regular rhythm, S1 normal and S2 normal. Exam reveals no gallop, no S3 and no S4.  No murmur heard. Pulmonary/Chest: No stridor. She has no wheezes. She has no rales.  Decreased breath sounds bilaterally  Musculoskeletal:        General: No edema.     Cervical back: Neck supple.  Skin: Skin is warm.   Impression & Recommendation(s):  Impression:   ICD-10-CM   1. Preprocedural examination  Z01.818 EKG 12-Lead    2. Aortic valve disease  I35.9 ECHOCARDIOGRAM COMPLETE    3. Nonobstructive atherosclerosis of coronary artery  I25.10     4. Precordial pain  R07.2     5. Shortness of breath  R06.02        Recommendation(s):  Preprocedural examination Being considered for colonoscopy with Dr. Rollin Date of the procedure to be determined. Was endorsing chest pain and shortness -did undergo stress test which was reported to have low risk. Overall acceptable risk for upcoming colonoscopy. However, if her symptoms worsen intensity frequency or duration patient is advised to call us  back for reevaluation.  She verbalizes understanding. May hold aspirin  for 7 days prior to colonoscopy if needed.  Aortic valve disease Asymptomatic. Based on the echo from August 2025 she is reported to have moderate aortic regurgitation. Aorta root and ascending aorta are not reported to be dilated. Will repeat an echocardiogram in a 1 year to reevaluate disease progression Blood pressures are well-controlled.  Nonobstructive atherosclerosis of coronary artery Precordial pain Precordial pain symptoms are predominantly noncardiac. Symptoms are chronic and not progressive, per patient EKG is nonischemic Stress test results 09/2024 low risk study Symptoms are not similar to when she had her cath in 2016 which noted mild nonobstructive disease and likely coronary vasospasm. I have advised the patient to seek medical attention if her symptoms increase in intensity frequency or  duration. Patient verbalized understanding  Shortness of breath Chronic and stable. Patient states that it is likely secondary to underlying COPD. She does follow-up with pulmonary medicine And working on smoking cessation, currently smokes 0.5 packs/day   Orders Placed:  Orders Placed This Encounter  Procedures   EKG 12-Lead   ECHOCARDIOGRAM  COMPLETE    Standing Status:   Future    Expected Date:   08/25/2025    Expiration Date:   11/23/2025    Where should this test be performed[facility]    Does the patient weigh less than or greater than 250 lbs?:   Patient weighs less than 250 lbs    Perflutren DEFINITY (image enhancing agent) should be administered unless hypersensitivity or allergy  exist:   Administer Perflutren    Reason for exam-Echo:   Aortic regurgitation I35.1     Final Medication List:   No orders of the defined types were placed in this encounter.   Medications Discontinued During This Encounter  Medication Reason   metoprolol  succinate (TOPROL -XL) 25 MG 24 hr tablet Patient Preference   predniSONE  (DELTASONE ) 10 MG tablet Patient Preference   nicotine  (NICODERM CQ  - DOSED IN MG/24 HOURS) 14 mg/24hr patch Patient Preference     Current Outpatient Medications:    albuterol  (PROVENTIL ) (2.5 MG/3ML) 0.083% nebulizer solution, Take 3 mLs (2.5 mg total) by nebulization every 6 (six) hours as needed for wheezing or shortness of breath., Disp: 75 mL, Rfl: 5   albuterol  (VENTOLIN  HFA) 108 (90 Base) MCG/ACT inhaler, Inhale 2 puffs into the lungs every 6 (six) hours as needed., Disp: , Rfl:    amLODipine  (NORVASC ) 2.5 MG tablet, Take 1 tablet (2.5 mg total) by mouth at bedtime., Disp: 90 tablet, Rfl: 3   aspirin  EC 81 MG tablet, Take 1 tablet (81 mg total) by mouth daily., Disp: 30 tablet, Rfl: 3   atorvastatin  (LIPITOR) 20 MG tablet, TAKE 1 TABLET BY MOUTH EVERY DAY (Patient taking differently: Take 20 mg by mouth every evening.), Disp: 90 tablet, Rfl: 3    bictegravir-emtricitabine-tenofovir AF (BIKTARVY) 50-200-25 MG TABS tablet, Take 1 tablet by mouth daily., Disp: 30 tablet, Rfl: 0   budeson-glycopyrrolate -formoterol  (BREZTRI ) 160-9-4.8 MCG/ACT AERO inhaler, Inhale 2 puffs into the lungs 2 (two) times daily., Disp: , Rfl:    budesonide -glycopyrrolate -formoterol  (BREZTRI  AEROSPHERE) 160-9-4.8 MCG/ACT AERO inhaler, Inhale 2 puffs into the lungs in the morning and at bedtime., Disp: , Rfl:    Ensifentrine  (OHTUVAYRE ) 3 MG/2.5ML SUSP, Inhale 3 mg into the lungs in the morning and at bedtime., Disp: , Rfl:    montelukast (SINGULAIR) 10 MG tablet, Take 10 mg by mouth daily., Disp: , Rfl:   Consent:   NA  Disposition:   Echocardiogram in August 2026, follow-up in September 2026  Her questions and concerns were addressed to her satisfaction. She voices understanding of the recommendations provided during this encounter.    Signed, Candace Michele HAS, [facility]  A Division of [facility] [address], [place], [place] [address]  11/12/2024 11:06 AM

## 2024-11-20 ENCOUNTER — Inpatient Hospital Stay (HOSPITAL_COMMUNITY)
Admission: EM | Admit: 2024-11-20 | Discharge: 2024-11-24 | DRG: 193 | Disposition: A | Attending: Internal Medicine | Admitting: Internal Medicine

## 2024-11-20 ENCOUNTER — Encounter (HOSPITAL_COMMUNITY): Payer: Self-pay

## 2024-11-20 ENCOUNTER — Other Ambulatory Visit: Payer: Self-pay

## 2024-11-20 ENCOUNTER — Emergency Department (HOSPITAL_COMMUNITY)

## 2024-11-20 DIAGNOSIS — E785 Hyperlipidemia, unspecified: Secondary | ICD-10-CM | POA: Diagnosis present

## 2024-11-20 DIAGNOSIS — J440 Chronic obstructive pulmonary disease with acute lower respiratory infection: Secondary | ICD-10-CM | POA: Diagnosis present

## 2024-11-20 DIAGNOSIS — F17210 Nicotine dependence, cigarettes, uncomplicated: Secondary | ICD-10-CM | POA: Diagnosis present

## 2024-11-20 DIAGNOSIS — J9621 Acute and chronic respiratory failure with hypoxia: Secondary | ICD-10-CM | POA: Diagnosis present

## 2024-11-20 DIAGNOSIS — Z87442 Personal history of urinary calculi: Secondary | ICD-10-CM

## 2024-11-20 DIAGNOSIS — J9601 Acute respiratory failure with hypoxia: Principal | ICD-10-CM

## 2024-11-20 DIAGNOSIS — J441 Chronic obstructive pulmonary disease with (acute) exacerbation: Secondary | ICD-10-CM | POA: Diagnosis present

## 2024-11-20 DIAGNOSIS — Z820 Family history of epilepsy and other diseases of the nervous system: Secondary | ICD-10-CM

## 2024-11-20 DIAGNOSIS — Z808 Family history of malignant neoplasm of other organs or systems: Secondary | ICD-10-CM

## 2024-11-20 DIAGNOSIS — K219 Gastro-esophageal reflux disease without esophagitis: Secondary | ICD-10-CM | POA: Diagnosis present

## 2024-11-20 DIAGNOSIS — Z8551 Personal history of malignant neoplasm of bladder: Secondary | ICD-10-CM

## 2024-11-20 DIAGNOSIS — Z7982 Long term (current) use of aspirin: Secondary | ICD-10-CM

## 2024-11-20 DIAGNOSIS — Z1152 Encounter for screening for COVID-19: Secondary | ICD-10-CM

## 2024-11-20 DIAGNOSIS — Z720 Tobacco use: Secondary | ICD-10-CM | POA: Diagnosis present

## 2024-11-20 DIAGNOSIS — Z79899 Other long term (current) drug therapy: Secondary | ICD-10-CM

## 2024-11-20 DIAGNOSIS — Z8249 Family history of ischemic heart disease and other diseases of the circulatory system: Secondary | ICD-10-CM

## 2024-11-20 DIAGNOSIS — Z882 Allergy status to sulfonamides status: Secondary | ICD-10-CM

## 2024-11-20 DIAGNOSIS — I2510 Atherosclerotic heart disease of native coronary artery without angina pectoris: Secondary | ICD-10-CM | POA: Diagnosis present

## 2024-11-20 DIAGNOSIS — I10 Essential (primary) hypertension: Secondary | ICD-10-CM | POA: Diagnosis present

## 2024-11-20 DIAGNOSIS — J189 Pneumonia, unspecified organism: Principal | ICD-10-CM | POA: Diagnosis present

## 2024-11-20 DIAGNOSIS — Z8349 Family history of other endocrine, nutritional and metabolic diseases: Secondary | ICD-10-CM

## 2024-11-20 DIAGNOSIS — Z888 Allergy status to other drugs, medicaments and biological substances status: Secondary | ICD-10-CM

## 2024-11-20 DIAGNOSIS — F419 Anxiety disorder, unspecified: Secondary | ICD-10-CM | POA: Diagnosis present

## 2024-11-20 DIAGNOSIS — Z833 Family history of diabetes mellitus: Secondary | ICD-10-CM

## 2024-11-20 LAB — BASIC METABOLIC PANEL WITH GFR
Anion gap: 12 (ref 5–15)
BUN: 13 mg/dL (ref 8–23)
CO2: 26 mmol/L (ref 22–32)
Calcium: 9.5 mg/dL (ref 8.9–10.3)
Chloride: 99 mmol/L (ref 98–111)
Creatinine, Ser: 0.86 mg/dL (ref 0.44–1.00)
GFR, Estimated: >60 mL/min (ref >60)
Glucose, Bld: 146 mg/dL — ABNORMAL HIGH (ref 70–99)
Potassium: 4.5 mmol/L (ref 3.5–5.1)
Sodium: 137 mmol/L (ref 135–145)

## 2024-11-20 LAB — CBC
HCT: 48.8 % — ABNORMAL HIGH (ref 36.0–46.0)
Hemoglobin: 16.5 g/dL — ABNORMAL HIGH (ref 12.0–15.0)
MCH: 32.2 pg (ref 26.0–34.0)
MCHC: 33.8 g/dL (ref 30.0–36.0)
MCV: 95.3 fL (ref 80.0–100.0)
Platelets: 283 K/uL (ref 150–400)
RBC: 5.12 MIL/uL — ABNORMAL HIGH (ref 3.87–5.11)
RDW: 13.2 % (ref 11.5–15.5)
WBC: 9.4 K/uL (ref 4.0–10.5)
nRBC: 0 % (ref 0.0–0.2)

## 2024-11-20 LAB — BRAIN NATRIURETIC PEPTIDE: B Natriuretic Peptide: 31.3 pg/mL (ref 0.0–100.0)

## 2024-11-20 MED ORDER — SODIUM CHLORIDE 0.9 % IV SOLN: INTRAVENOUS | Status: DC

## 2024-11-20 MED ORDER — SODIUM CHLORIDE 0.9 % IV SOLN
500.0000 mg | Freq: Once | INTRAVENOUS | Status: AC
  Administered 2024-11-21: 500 mg via INTRAVENOUS
  Filled 2024-11-20: qty 5

## 2024-11-20 MED ORDER — ENOXAPARIN SODIUM 30 MG/0.3ML IJ SOSY
30.0000 mg | PREFILLED_SYRINGE | Freq: Every day | INTRAMUSCULAR | Status: DC
  Administered 2024-11-22 – 2024-11-24 (×3): 30 mg via SUBCUTANEOUS
  Filled 2024-11-20 (×4): qty 0.3

## 2024-11-20 MED ORDER — ATORVASTATIN CALCIUM 10 MG PO TABS
20.0000 mg | ORAL_TABLET | Freq: Every evening | ORAL | Status: DC
  Administered 2024-11-21 – 2024-11-23 (×3): 20 mg via ORAL
  Filled 2024-11-20 (×3): qty 2

## 2024-11-20 MED ORDER — ALBUTEROL SULFATE (2.5 MG/3ML) 0.083% IN NEBU: 2.5000 mg | INHALATION_SOLUTION | Freq: Four times a day (QID) | RESPIRATORY_TRACT | Status: DC | PRN

## 2024-11-20 MED ORDER — METHYLPREDNISOLONE SODIUM SUCC 125 MG IJ SOLR
125.0000 mg | Freq: Once | INTRAMUSCULAR | Status: AC
  Administered 2024-11-21: 125 mg via INTRAVENOUS
  Filled 2024-11-20: qty 2

## 2024-11-20 MED ORDER — ALBUTEROL SULFATE (2.5 MG/3ML) 0.083% IN NEBU
2.5000 mg | INHALATION_SOLUTION | Freq: Four times a day (QID) | RESPIRATORY_TRACT | Status: DC
  Administered 2024-11-21 (×2): 2.5 mg via RESPIRATORY_TRACT
  Filled 2024-11-20 (×2): qty 3

## 2024-11-20 MED ORDER — MONTELUKAST SODIUM 10 MG PO TABS
10.0000 mg | ORAL_TABLET | Freq: Every day | ORAL | Status: DC
  Administered 2024-11-21 – 2024-11-24 (×4): 10 mg via ORAL
  Filled 2024-11-20 (×4): qty 1

## 2024-11-20 MED ORDER — METHYLPREDNISOLONE SODIUM SUCC 125 MG IJ SOLR
80.0000 mg | Freq: Every day | INTRAMUSCULAR | Status: DC
  Administered 2024-11-21 – 2024-11-24 (×4): 80 mg via INTRAVENOUS
  Filled 2024-11-20 (×4): qty 2

## 2024-11-20 MED ORDER — ALBUTEROL SULFATE HFA 108 (90 BASE) MCG/ACT IN AERS: 2.0000 | INHALATION_SPRAY | Freq: Four times a day (QID) | RESPIRATORY_TRACT | Status: DC | PRN

## 2024-11-20 MED ORDER — SODIUM CHLORIDE 0.9 % IV SOLN: 500.0000 mg | INTRAVENOUS | Status: DC

## 2024-11-20 MED ORDER — AMLODIPINE BESYLATE 5 MG PO TABS
2.5000 mg | ORAL_TABLET | Freq: Every day | ORAL | Status: DC
  Administered 2024-11-21 – 2024-11-23 (×4): 2.5 mg via ORAL
  Filled 2024-11-20 (×5): qty 1

## 2024-11-20 MED ORDER — SODIUM CHLORIDE 0.9 % IV SOLN: 1.0000 g | INTRAVENOUS | Status: DC

## 2024-11-20 MED ORDER — METHYLPREDNISOLONE SODIUM SUCC 40 MG IJ SOLR: 40.0000 mg | Freq: Two times a day (BID) | INTRAMUSCULAR | Status: DC

## 2024-11-20 MED ORDER — SODIUM CHLORIDE 0.9 % IV SOLN
1.0000 g | Freq: Once | INTRAVENOUS | Status: AC
  Administered 2024-11-21: 1 g via INTRAVENOUS
  Filled 2024-11-20: qty 10

## 2024-11-20 MED ORDER — BUDESON-GLYCOPYRROL-FORMOTEROL 160-9-4.8 MCG/ACT IN AERO
2.0000 | INHALATION_SPRAY | Freq: Two times a day (BID) | RESPIRATORY_TRACT | Status: DC
  Administered 2024-11-21 (×2): 2 via RESPIRATORY_TRACT
  Filled 2024-11-20: qty 5.9

## 2024-11-20 MED ORDER — SODIUM CHLORIDE 0.9 % IV SOLN: 2.0000 g | INTRAVENOUS | Status: DC

## 2024-11-20 MED ORDER — IPRATROPIUM-ALBUTEROL 0.5-2.5 (3) MG/3ML IN SOLN
3.0000 mL | Freq: Once | RESPIRATORY_TRACT | Status: AC
  Administered 2024-11-20: 3 mL via RESPIRATORY_TRACT
  Filled 2024-11-20: qty 3

## 2024-11-20 NOTE — H&P (Signed)
 History and Physical    Patient: Candace Middleton FMW:[id number] DOB: Apr 14, 1961 DOA: 11/20/2024 DOS: the patient was seen and examined on 11/20/2024 PCP: Pcp, No  Patient coming from: Home  Chief Complaint:  Chief Complaint  Patient presents with   Shortness of Breath   HPI: Candace Middleton is a 63 y.o. female with medical history significant of COPD but not on oxygen, coronary artery disease, depression, GERD, ongoing tobacco use, anxiety disorder, osteoarthritis, osteoporosis, history of bladder cancer who presents to the ER with 2 days of progressive shortness of breath cough as well as chest wall pain.  Patient had pneumonia with COPD exacerbation back in April and she was admitted to the hospital.  This symptoms felt like the same so she decided to come to the ER.  Denied any fever or chills.  Denied any sick contact.  Denied any rhinorrhea or sore throat.  Patient was seen and evaluated.  She appears to have new oxygen requirement.  On arrival her oxygen sat was 73% on room air.  Chest x-ray shows bibasilar pneumonia.  Patient also has expiratory wheezing.  She has been admitted with acute hypoxic respiratory failure was possibly COPD exacerbation and pneumonia  Review of Systems: As mentioned in the history of present illness. All other systems reviewed and are negative. Past Medical History:  Diagnosis Date   Allergy     Anxiety    Arthritis    Cancer (HCC)    bladder ca   COPD (chronic obstructive pulmonary disease) (HCC)    Coronary artery disease    Depression    GERD (gastroesophageal reflux disease)    History of kidney stones    Hyperlipidemia    Neuromuscular disorder (HCC) 2014   RSD - nerve blocks in back for treatment    Osteoporosis 09/05/2019   Past Surgical History:  Procedure Laterality Date   bladder cancer  2006   BLADDER TUMOR EXCISION  12/25/2004   BREAST SURGERY  long time ago   lumpectomy    CARDIAC CATHETERIZATION N/A 09/07/2015    Procedure: Left Heart Cath and Coronary Angiography;  Surgeon: Candyce GORMAN Reek, MD;  Location: [facility] INVASIVE CV LAB;  Service: Cardiovascular;  Laterality: N/A;   CESAREAN SECTION  1982   CYSTOSCOPY WITH BIOPSY N/A 09/26/2023   Procedure: CYSTOSCOPY WITH BLADDER  BIOPSY;  Surgeon: Alvaro Ricardo KATHEE Mickey., MD;  Location: [facility];  Service: Urology;  Laterality: N/A;  45 MINS FOR CASE   CYSTOSCOPY/URETEROSCOPY/HOLMIUM LASER/STENT PLACEMENT Left 09/26/2023   Procedure: CYSTOSCOPY LEFT RETROGRADE PYELOGRAM/URETEROSCOPY/BASKETING OF STONE;  Surgeon: Alvaro Ricardo KATHEE Mickey., MD;  Location: [facility];  Service: Urology;  Laterality: Left;   ENDOMETRIAL ABLATION  12/25/1993   Social History:  reports that she has been smoking cigarettes. She has a 15 pack-year smoking history. She has never used smokeless tobacco. She reports that she does not drink alcohol  and does not use drugs.  Allergies  Allergen Reactions   Gabapentin  Hives    Patient reports facial swelling, hives, facial involvement    Sulfa Antibiotics Anaphylaxis    Onset at age 56, almost died because I stopped breathing.    Family History  Problem Relation Age of Onset   Heart disease Mother    Early death Father    Pulmonary embolism Father    Thyroid  disease Sister    Melanoma Sister    Diabetes Brother    Thyroid  disease Brother    Epilepsy Son    Colon cancer Neg Hx  Prior to Admission medications   Medication Sig Start Date End Date Taking? Authorizing Provider  albuterol  (PROVENTIL ) (2.5 MG/3ML) 0.083% nebulizer solution Take 3 mLs (2.5 mg total) by nebulization every 6 (six) hours as needed for wheezing or shortness of breath. 09/08/24   Hope Almarie ORN, NP  albuterol  (VENTOLIN  HFA) 108 [phone] Base) MCG/ACT inhaler Inhale 2 puffs into the lungs every 6 (six) hours as needed.    [provider]  amLODipine  (NORVASC ) 2.5 MG tablet Take 1 tablet (2.5 mg total) by mouth at bedtime. 09/30/24   Parthenia Olivia HERO, PA-C   aspirin  EC 81 MG tablet Take 1 tablet (81 mg total) by mouth daily. 08/13/15   Marcus Annabella RAMAN, PA-C  atorvastatin  (LIPITOR) 20 MG tablet TAKE 1 TABLET BY MOUTH EVERY DAY Patient taking differently: Take 20 mg by mouth every evening. 06/22/20   Cresenzo, John V, MD  budeson-glycopyrrolate -formoterol  (BREZTRI ) 160-9-4.8 MCG/ACT AERO inhaler Inhale 2 puffs into the lungs 2 (two) times daily.    [provider]  budesonide -glycopyrrolate -formoterol  (BREZTRI  AEROSPHERE) 160-9-4.8 MCG/ACT AERO inhaler Inhale 2 puffs into the lungs in the morning and at bedtime. 07/03/24   Geronimo Amel, MD  Ensifentrine  (OHTUVAYRE ) 3 MG/2.5ML SUSP Inhale 3 mg into the lungs in the morning and at bedtime. 09/12/24   Hope Almarie ORN, NP  montelukast  (SINGULAIR ) 10 MG tablet Take 10 mg by mouth daily.    [provider]    Physical Exam: Vitals:   11/20/24 1635 11/20/24 1643 11/20/24 1649 11/20/24 2031  BP:    (!) 140/70  Pulse:    88  Resp:    19  Temp:    97.9 F (36.6 C)  TempSrc:      SpO2: 97% 95%  92%  Weight:   34 kg   Height:   4' 9 (1.448 m)    Constitutional: Cachectic, chronically ill looking NAD, calm, comfortable Eyes: PERRL, lids and conjunctivae normal ENMT: Mucous membranes are moist. Posterior pharynx clear of any exudate or lesions.Normal dentition.  Neck: normal, supple, no masses, no thyromegaly Respiratory: Decreased air entry bilaterally with some mild expiratory wheezing, normal respiratory effort. No accessory muscle use.  Cardiovascular: Sinus tachycardia, no murmurs / rubs / gallops. No extremity edema. 2+ pedal pulses. No carotid bruits.  Abdomen: no tenderness, no masses palpated. No hepatosplenomegaly. Bowel sounds positive.  Musculoskeletal: Good range of motion, no joint swelling or tenderness, Skin: no rashes, lesions, ulcers. No induration Neurologic: CN 2-12 grossly intact. Sensation intact, DTR normal. Strength 5/5 in all 4.  Psychiatric: Normal  judgment and insight. Alert and oriented x 3.  Anxious mood  Data Reviewed:  Blood glucose is 146, normal creatinine.  Hemoglobin 16.5, chest x-ray showing bibasilar infiltrates suspicion for aspiration EKG shows normal sinus rhythm temperature 97.9, blood pressure was 140/70 pulse 109, respirate of 25 oxygen sat 73% on room air currently 95% on 2 L  Assessment and Plan:  #1 acute hypoxic respiratory failure: Patient here with new oxygen requirement.  Oxygen sats 70s on room air.  Currently found to have pneumonia and COPD.  Will initiate IV Rocephin  as well as azithromycin .  Patient will also be on steroids.  Continue to monitor.  #2 bibasilar pneumonia: Continue with IV antibiotics.  On oxygen.  Hopefully we may titrate her off.  #3 COPD with acute exacerbation: Stable.  On oxygen.  Continue treatment.  #4 coronary artery disease: Stable.  Continue monitor.  #5 tobacco abuse: Counseling provided.  Patient has not smoked  in 2 days.  She declined nicotine  patch  #6 hyperlipidemia: Continue statin  #7 anxiety disorder: Continue home regimen   Advance Care Planning:   Code Status: Full Code   Consults: None  Family Communication: No family at bedside  Severity of Illness: The appropriate patient status for this patient is INPATIENT. Inpatient status is judged to be reasonable and necessary in order to provide the required intensity of service to ensure the patient's safety. The patient's presenting symptoms, physical exam findings, and initial radiographic and laboratory data in the context of their chronic comorbidities is felt to place them at high risk for further clinical deterioration. Furthermore, it is not anticipated that the patient will be medically stable for discharge from the hospital within 2 midnights of admission.   * I certify that at the point of admission it is my clinical judgment that the patient will require inpatient hospital care spanning beyond 2 midnights from  the point of admission due to high intensity of service, high risk for further deterioration and high frequency of surveillance required.*  AuthorBETHA SIM KNOLL, MD 11/20/2024 11:03 PM  For on call review [email or web address].

## 2024-11-20 NOTE — ED Provider Notes (Signed)
 [facility] DE[facility] Emergency Department Provider Note MRN:  [id number]  Arrival date & time: 11/20/24     Chief Complaint   Shortness of Breath   History of Present Illness   Candace Middleton is a 63 y.o. year-old female presents to the ED with chief complaint of cough and SOB.  Onset was yesterday.  States that she had pneumonia back in April and states that she feels the same.  States that she gets very SOB and her O2 sat drops when she is walking.  Denies fever or chills.  States that the cough is not productive.  Denies nausea or vomiting.  History provided by patient.   Review of Systems  Pertinent positive and negative review of systems noted in HPI.    Physical Exam   Vitals:   11/20/24 1643 11/20/24 2031  BP:  (!) 140/70  Pulse:  88  Resp:  19  Temp:  97.9 F (36.6 C)  SpO2: 95% 92%    CONSTITUTIONAL:  non toxic-appearing, NAD NEURO:  Alert and oriented x 3, CN 3-12 grossly intact EYES:  eyes equal and reactive ENT/NECK:  Supple, no stridor  CARDIO:  normal rate, regular rhythm, appears well-perfused  PULM:  moderate increased WOB, coarse cough, wheezing present GI/GU:  non-distended,  MSK/SPINE:  No gross deformities, no edema, moves all extremities  SKIN:  no rash, atraumatic   *Additional and/or pertinent findings included in MDM below  Diagnostic and Interventional Summary    EKG Interpretation Date/Time:    Ventricular Rate:    PR Interval:    QRS Duration:    QT Interval:    QTC Calculation:   R Axis:      Text Interpretation:         Labs Reviewed  BASIC METABOLIC PANEL WITH GFR - Abnormal; Notable for the following components:      Result Value   Glucose, Bld 146 (*)    All other components within normal limits  CBC - Abnormal; Notable for the following components:   RBC 5.12 (*)    Hemoglobin 16.5 (*)    HCT 48.8 (*)    All other components within normal limits  RESP PANEL BY RT-PCR (RSV, FLU A&B, COVID)   [id number]  BRAIN NATRIURETIC PEPTIDE  I-STAT CG4 LACTIC ACID, ED    DG Chest 2 View  Final Result      Medications  cefTRIAXone  (ROCEPHIN ) 1 g in sodium chloride  0.9 % 100 mL IVPB (has no administration in time range)  azithromycin  (ZITHROMAX ) 500 mg in sodium chloride  0.9 % 250 mL IVPB (has no administration in time range)  ipratropium-albuterol  (DUONEB) 0.5-2.5 (3) MG/3ML nebulizer solution 3 mL (has no administration in time range)  methylPREDNISolone  sodium succinate (SOLU-MEDROL ) 125 mg/2 mL injection 125 mg (has no administration in time range)     Procedures  /  Critical Care Procedures  ED Course and Medical Decision Making  I have reviewed the triage vital signs, the nursing notes, and pertinent available records from the EMR.  Social Determinants Affecting Complexity of Care: Patient has no clinically significant social determinants affecting this chief complaint..   ED Course:    Medical Decision Making History of COPD, now presenting with cough and shortness of breath x 2 days.  Denies fevers.  She does have a very coarse cough and wheezing at the bedside.  Will give breathing treatment and Solu-Medrol .  Will treat for pneumonia as seen on chest x-ray with ceftriaxone  and azithromycin .  She will need admission to the hospital due to her new O2 requirement.  Amount and/or Complexity of Data Reviewed Labs: ordered. Radiology: ordered.  Risk Prescription drug management. Decision regarding hospitalization.         Consultants: I consulted with Hospitalist, Dr. Sim, who is appreciated for admitting.   Treatment and Plan: Patient's exam and diagnostic results are concerning for acute hypoxic respiratory failure acute.  Feel that patient will need admission to the hospital for further treatment and evaluation.    Final Clinical Impressions(s) / ED Diagnoses     ICD-10-CM   1. Acute respiratory failure with hypoxia (HCC)  J96.01       ED Discharge  Orders     None         Discharge Instructions Discussed with and Provided to Patient:   Discharge Instructions   None      Vicky Charleston, PA-C 11/20/24 2259    Mesner, Selinda, MD 11/21/24 [id number]

## 2024-11-20 NOTE — ED Triage Notes (Signed)
 C/O cough since yesterday and SHOB. Denies CP. Pt usually does not wear O2. Axox4.

## 2024-11-21 DIAGNOSIS — J9621 Acute and chronic respiratory failure with hypoxia: Secondary | ICD-10-CM

## 2024-11-21 DIAGNOSIS — J441 Chronic obstructive pulmonary disease with (acute) exacerbation: Secondary | ICD-10-CM | POA: Diagnosis not present

## 2024-11-21 DIAGNOSIS — I2583 Coronary atherosclerosis due to lipid rich plaque: Secondary | ICD-10-CM

## 2024-11-21 DIAGNOSIS — J189 Pneumonia, unspecified organism: Secondary | ICD-10-CM

## 2024-11-21 DIAGNOSIS — I2510 Atherosclerotic heart disease of native coronary artery without angina pectoris: Secondary | ICD-10-CM | POA: Diagnosis not present

## 2024-11-21 LAB — COMPREHENSIVE METABOLIC PANEL WITH GFR
ALT: 23 U/L (ref 0–44)
AST: 22 U/L (ref 15–41)
Albumin: 3.2 g/dL — ABNORMAL LOW (ref 3.5–5.0)
Alkaline Phosphatase: 70 U/L (ref 38–126)
Anion gap: 12 (ref 5–15)
BUN: 12 mg/dL (ref 8–23)
CO2: 22 mmol/L (ref 22–32)
Calcium: 8.6 mg/dL — ABNORMAL LOW (ref 8.9–10.3)
Chloride: 105 mmol/L (ref 98–111)
Creatinine, Ser: 0.79 mg/dL (ref 0.44–1.00)
GFR, Estimated: >60 mL/min (ref >60)
Glucose, Bld: 193 mg/dL — ABNORMAL HIGH (ref 70–99)
Potassium: 4.6 mmol/L (ref 3.5–5.1)
Sodium: 139 mmol/L (ref 135–145)
Total Bilirubin: 0.5 mg/dL (ref 0.0–1.2)
Total Protein: 5.7 g/dL — ABNORMAL LOW (ref 6.5–8.1)

## 2024-11-21 LAB — CBC
HCT: 45.6 % (ref 36.0–46.0)
HCT: 49.5 % — ABNORMAL HIGH (ref 36.0–46.0)
Hemoglobin: 15.6 g/dL — ABNORMAL HIGH (ref 12.0–15.0)
Hemoglobin: 16.5 g/dL — ABNORMAL HIGH (ref 12.0–15.0)
MCH: 32.2 pg (ref 26.0–34.0)
MCH: 32.6 pg (ref 26.0–34.0)
MCHC: 33.3 g/dL (ref 30.0–36.0)
MCHC: 34.2 g/dL (ref 30.0–36.0)
MCV: 95.4 fL (ref 80.0–100.0)
MCV: 96.5 fL (ref 80.0–100.0)
Platelets: 266 K/uL (ref 150–400)
Platelets: 272 K/uL (ref 150–400)
RBC: 4.78 MIL/uL (ref 3.87–5.11)
RBC: 5.13 MIL/uL — ABNORMAL HIGH (ref 3.87–5.11)
RDW: 13.2 % (ref 11.5–15.5)
RDW: 13.2 % (ref 11.5–15.5)
WBC: 8.9 K/uL (ref 4.0–10.5)
WBC: 9.6 K/uL (ref 4.0–10.5)
nRBC: 0 % (ref 0.0–0.2)
nRBC: 0 % (ref 0.0–0.2)

## 2024-11-21 LAB — RESP PANEL BY RT-PCR (RSV, FLU A&B, COVID)  RVPGX2
Influenza A by PCR: NEGATIVE
Influenza B by PCR: NEGATIVE
Resp Syncytial Virus by PCR: NEGATIVE
SARS Coronavirus 2 by RT PCR: NEGATIVE

## 2024-11-21 LAB — HIV ANTIBODY (ROUTINE TESTING W REFLEX): HIV Screen 4th Generation wRfx: NONREACTIVE

## 2024-11-21 LAB — CREATININE, SERUM
Creatinine, Ser: 0.8 mg/dL (ref 0.44–1.00)
GFR, Estimated: >60 mL/min (ref >60)

## 2024-11-21 LAB — I-STAT CG4 LACTIC ACID, ED: Lactic Acid, Venous: 0.9 mmol/L (ref 0.5–1.9)

## 2024-11-21 MED ORDER — ALBUTEROL SULFATE (2.5 MG/3ML) 0.083% IN NEBU: 2.5000 mg | INHALATION_SOLUTION | RESPIRATORY_TRACT | Status: DC | PRN

## 2024-11-21 MED ORDER — ARFORMOTEROL TARTRATE 15 MCG/2ML IN NEBU
15.0000 ug | INHALATION_SOLUTION | Freq: Two times a day (BID) | RESPIRATORY_TRACT | Status: DC
Start: 2024-11-21 — End: 2024-11-21

## 2024-11-21 MED ORDER — FLUTICASONE PROPIONATE 50 MCG/ACT NA SUSP
2.0000 | Freq: Every day | NASAL | Status: DC
  Administered 2024-11-21 – 2024-11-24 (×4): 2 via NASAL
  Filled 2024-11-21: qty 16

## 2024-11-21 MED ORDER — OXYMETAZOLINE HCL 0.05 % NA SOLN: 1.0000 | Freq: Two times a day (BID) | NASAL | Status: AC | PRN

## 2024-11-21 MED ORDER — ARFORMOTEROL TARTRATE 15 MCG/2ML IN NEBU
15.0000 ug | INHALATION_SOLUTION | Freq: Two times a day (BID) | RESPIRATORY_TRACT | Status: DC
  Administered 2024-11-21 – 2024-11-24 (×6): 15 ug via RESPIRATORY_TRACT
  Filled 2024-11-21 (×6): qty 2

## 2024-11-21 MED ORDER — IPRATROPIUM-ALBUTEROL 0.5-2.5 (3) MG/3ML IN SOLN
3.0000 mL | Freq: Three times a day (TID) | RESPIRATORY_TRACT | Status: DC
  Administered 2024-11-21 – 2024-11-22 (×3): 3 mL via RESPIRATORY_TRACT
  Filled 2024-11-21 (×3): qty 3

## 2024-11-21 MED ORDER — BUDESONIDE 0.25 MG/2ML IN SUSP
0.2500 mg | Freq: Two times a day (BID) | RESPIRATORY_TRACT | Status: DC
  Administered 2024-11-21 – 2024-11-24 (×6): 0.25 mg via RESPIRATORY_TRACT
  Filled 2024-11-21 (×6): qty 2

## 2024-11-21 MED ORDER — SODIUM CHLORIDE 0.9 % IV SOLN
2.0000 g | INTRAVENOUS | Status: DC
  Administered 2024-11-21 – 2024-11-23 (×3): 2 g via INTRAVENOUS
  Filled 2024-11-21 (×3): qty 20

## 2024-11-21 MED ORDER — BUDESONIDE 0.25 MG/2ML IN SUSP: 0.2500 mg | Freq: Two times a day (BID) | RESPIRATORY_TRACT | Status: DC

## 2024-11-21 MED ORDER — REVEFENACIN 175 MCG/3ML IN SOLN
175.0000 ug | Freq: Every day | RESPIRATORY_TRACT | Status: DC
  Administered 2024-11-22 – 2024-11-24 (×3): 175 ug via RESPIRATORY_TRACT
  Filled 2024-11-21 (×3): qty 3

## 2024-11-21 MED ORDER — PANTOPRAZOLE SODIUM 40 MG PO TBEC
40.0000 mg | DELAYED_RELEASE_TABLET | Freq: Every day | ORAL | Status: DC
  Administered 2024-11-21 – 2024-11-24 (×4): 40 mg via ORAL
  Filled 2024-11-21 (×4): qty 1

## 2024-11-21 MED ORDER — REVEFENACIN 175 MCG/3ML IN SOLN: 175.0000 ug | Freq: Every day | RESPIRATORY_TRACT | Status: DC

## 2024-11-21 MED ORDER — METHYLPREDNISOLONE SODIUM SUCC 40 MG IJ SOLR: 40.0000 mg | Freq: Two times a day (BID) | INTRAMUSCULAR | Status: DC

## 2024-11-21 MED ORDER — BENZONATATE 100 MG PO CAPS
200.0000 mg | ORAL_CAPSULE | Freq: Three times a day (TID) | ORAL | Status: DC | PRN
  Administered 2024-11-22 – 2024-11-24 (×5): 200 mg via ORAL
  Filled 2024-11-21 (×5): qty 2

## 2024-11-21 MED ORDER — AZITHROMYCIN 500 MG PO TABS
500.0000 mg | ORAL_TABLET | Freq: Every day | ORAL | Status: AC
  Administered 2024-11-21 – 2024-11-22 (×2): 500 mg via ORAL
  Filled 2024-11-21 (×2): qty 1

## 2024-11-21 NOTE — TOC Transition Note (Signed)
 Transition of Care [facility]) - Discharge Note   Patient Details  Name: Candace Middleton MRN: [id number] Date of Birth: 1961-04-28  Transition of [facility]) CM/SW Contact:  Candace DELENA Senters, RN Phone Number: 11/21/2024, 12:53 PM   Clinical Narrative:    RR:fziprjo history significant of COPD but not on oxygen, coronary artery disease, depression, GERD, ongoing tobacco use, anxiety disorder, osteoarthritis, osteoporosis, history of bladder cancer who presents to the ER with 2 days of progressive shortness of breath cough as well as chest wall pain   Patient lives with handicap son. Patient does report having neighbor for support when needed. Patient has transportation home at d/c. Patient drives, manages medications, has PCP.   No needs identified at this time. CM will continue to follow for possible TOC needs.     Barriers to Discharge: Continued Medical Work up   Patient Goals and CMS Choice            Discharge Placement                       Discharge Plan and Services Additional resources added to the After Visit Summary for                                       Social Drivers of Health (SDOH) Interventions SDOH Screenings   Food Insecurity: No Food Insecurity (04/15/2024)  Housing: Low Risk  (04/15/2024)  Transportation Needs: No Transportation Needs (04/15/2024)  Utilities: Not At Risk (04/15/2024)  Depression (PHQ2-9): Low Risk  (09/05/2019)  Social Connections: Unknown (04/15/2024)  Tobacco Use: High Risk (11/20/2024)     Readmission Risk Interventions     No data to display

## 2024-11-21 NOTE — Progress Notes (Signed)
 PROGRESS NOTE        PATIENT DETAILS Name: Candace Middleton Age: 63 y.o. Sex: female Date of Birth: December 29, 1960 Admit Date: 11/20/2024 Admitting Physician Emery LITTIE Fuss, MD PCP:Pcp, No  Brief Summary: Patient is a 63 y.o.  female with history of COPD, tobacco use, HLD, HTN who had a runny nose for the past several days and then had gradually progressive cough/shortness of breath-presented to the ED with worsening shortness of breath on 11/27-found to have COPD exacerbation and subsequently admitted to the hospitalist service  Significant events: 11/27>> admit to [facility]  Significant studies: 11/27>> CXR: Minimal bibasilar patchy opacities-atelectasis versus aspiration/pneumonia.  Significant microbiology data: 11/28>> COVID/influenza/RSV PCR: Negative  Procedures: None  Consults: None  Subjective: Feels better-still wheezing-still short of breath-on 3 L of oxygen-noted back to baseline.  Objective: Vitals: Blood pressure 104/67, pulse (!) 108, temperature 97.8 F (36.6 C), temperature source Oral, resp. rate 20, height 4' 9 (1.448 m), weight 34 kg, SpO2 92%.   Exam: Gen Exam:Alert awake-not in any distress HEENT:atraumatic, normocephalic Chest: Moving air well-but diminished entry at bases-coarse rhonchi all over. CVS:S1S2 regular Abdomen:soft non tender, non distended Extremities:no edema Neurology: Non focal Skin: no rash  Pertinent Labs/Radiology:    Latest Ref Rng & Units 11/21/2024    3:47 AM 11/21/2024   12:13 AM 11/20/2024    5:00 PM  CBC  WBC 4.0 - 10.5 K/uL 9.6  8.9  9.4   Hemoglobin 12.0 - 15.0 g/dL 84.3  83.4  83.4   Hematocrit 36.0 - 46.0 % 45.6  49.5  48.8   Platelets 150 - 400 K/uL 272  266  283     Lab Results  Component Value Date   NA 139 11/21/2024   K 4.6 11/21/2024   CL 105 11/21/2024   CO2 22 11/21/2024    Assessment/Plan: COPD exacerbation Still wheezing-not yet back to baseline IV  steroids Scheduled bronchodilators Incentive spirometry/flutter valve Mobilize.  PNA More cough than usual baseline Chest x-ray showing either infiltrates versus atelectasis Afebrile-nontoxic-appearing-no leukocytosis Empirically being treated with Rocephin /Zithromax -continue as ordered  HTN BP stable Amlodipine   HLD Statin  Tobacco abuse Counseled Claims she has attempting to quit-and has cut down quite a bit. Does not want a transdermal nicotine  patch.  Code status:   Code Status: Full Code   DVT Prophylaxis: enoxaparin  (LOVENOX ) injection 30 mg Start: 11/21/24 1000   Family Communication: None at bedside   Disposition Plan: Status is: Inpatient Remains inpatient appropriate because: Severity of illness   Planned Discharge Destination:Home   Diet: Diet Order             Diet Heart Room service appropriate? Yes; Fluid consistency: Thin  Diet effective now                     Antimicrobial agents: Anti-infectives (From admission, onward)    Start     Dose/Rate Route Frequency Ordered Stop   11/21/24 2200  cefTRIAXone  (ROCEPHIN ) 1 g in sodium chloride  0.9 % 100 mL IVPB        1 g 200 mL/hr over 30 Minutes Intravenous Every 24 hours 11/20/24 2339 11/26/24 2159   11/21/24 2200  azithromycin  (ZITHROMAX ) 500 mg in sodium chloride  0.9 % 250 mL IVPB        500 mg 250 mL/hr over 60 Minutes Intravenous Every 24  hours 11/20/24 2339 11/26/24 2159   11/20/24 2345  cefTRIAXone  (ROCEPHIN ) 2 g in sodium chloride  0.9 % 100 mL IVPB  Status:  Discontinued        2 g 200 mL/hr over 30 Minutes Intravenous Every 24 hours 11/20/24 2332 11/20/24 2338   11/20/24 2345  azithromycin  (ZITHROMAX ) 500 mg in sodium chloride  0.9 % 250 mL IVPB  Status:  Discontinued        500 mg 250 mL/hr over 60 Minutes Intravenous Every 24 hours 11/20/24 2332 11/20/24 2339   11/20/24 2245  cefTRIAXone  (ROCEPHIN ) 1 g in sodium chloride  0.9 % 100 mL IVPB        1 g 200 mL/hr over 30 Minutes  Intravenous  Once 11/20/24 2238 11/21/24 0110   11/20/24 2245  azithromycin  (ZITHROMAX ) 500 mg in sodium chloride  0.9 % 250 mL IVPB        500 mg 250 mL/hr over 60 Minutes Intravenous  Once 11/20/24 2238 11/21/24 0252        MEDICATIONS: Scheduled Meds:  albuterol   2.5 mg Nebulization Q6H   amLODipine   2.5 mg Oral QHS   atorvastatin   20 mg Oral QPM   budesonide -glycopyrrolate -formoterol   2 puff Inhalation BID   enoxaparin  (LOVENOX ) injection  30 mg Subcutaneous Daily   methylPREDNISolone  (SOLU-MEDROL ) injection  80 mg Intravenous Daily   montelukast   10 mg Oral Daily   Continuous Infusions:  sodium chloride  100 mL/hr at 11/21/24 0023   azithromycin      cefTRIAXone  (ROCEPHIN )  IV     PRN Meds:.albuterol    I have personally reviewed following labs and imaging studies  LABORATORY DATA: CBC: Recent Labs  Lab 11/20/24 1700 11/21/24 0013 11/21/24 0347  WBC 9.4 8.9 9.6  HGB 16.5* 16.5* 15.6*  HCT 48.8* 49.5* 45.6  MCV 95.3 96.5 95.4  PLT 283 266 272    Basic Metabolic Panel: Recent Labs  Lab 11/20/24 1700 11/21/24 0013 11/21/24 0347  NA 137  --  139  K 4.5  --  4.6  CL 99  --  105  CO2 26  --  22  GLUCOSE 146*  --  193*  BUN 13  --  12  CREATININE 0.86 0.80 0.79  CALCIUM  9.5  --  8.6*    GFR: Estimated Creatinine Clearance: 37.6 mL/min (by C-G formula based on SCr of 0.79 mg/dL).  Liver Function Tests: Recent Labs  Lab 11/21/24 0347  AST 22  ALT 23  ALKPHOS 70  BILITOT 0.5  PROT 5.7*  ALBUMIN 3.2*   No results for input(s): LIPASE, AMYLASE in the last 168 hours. No results for input(s): AMMONIA in the last 168 hours.  Coagulation Profile: No results for input(s): INR, PROTIME in the last 168 hours.  Cardiac Enzymes: No results for input(s): CKTOTAL, CKMB, CKMBINDEX, TROPONINI in the last 168 hours.  BNP (last 3 results) No results for input(s): PROBNP in the last 8760 hours.  Lipid Profile: No results for input(s):  CHOL, HDL, LDLCALC, TRIG, CHOLHDL, LDLDIRECT in the last 72 hours.  Thyroid  Function Tests: No results for input(s): TSH, T4TOTAL, FREET4, T3FREE, THYROIDAB in the last 72 hours.  Anemia Panel: No results for input(s): VITAMINB12, FOLATE, FERRITIN, TIBC, IRON, RETICCTPCT in the last 72 hours.  Urine analysis:    Component Value Date/Time   COLORURINE YELLOW 04/15/2024 0348   APPEARANCEUR HAZY (A) 04/15/2024 0348   LABSPEC 1.010 04/15/2024 0348   PHURINE 6.0 04/15/2024 0348   GLUCOSEU NEGATIVE 04/15/2024 0348   HGBUR NEGATIVE 04/15/2024  0348   BILIRUBINUR NEGATIVE 04/15/2024 0348   BILIRUBINUR neg 08/23/2015 1233   KETONESUR 5 (A) 04/15/2024 0348   PROTEINUR NEGATIVE 04/15/2024 0348   UROBILINOGEN 0.2 08/23/2015 1233   UROBILINOGEN 0.2 06/21/2015 1337   NITRITE POSITIVE (A) 04/15/2024 0348   LEUKOCYTESUR MODERATE (A) 04/15/2024 0348    Sepsis Labs: Lactic Acid, Venous    Component Value Date/Time   LATICACIDVEN 0.9 11/21/2024 0015    MICROBIOLOGY: Recent Results (from the past 240 hours)  Resp panel by RT-PCR (RSV, Flu A&B, Covid) Anterior Nasal Swab     Status: None   Collection Time: 11/21/24  1:35 AM   Specimen: Anterior Nasal Swab  Result Value Ref Range Status   SARS Coronavirus 2 by RT PCR NEGATIVE NEGATIVE Final   Influenza A by PCR NEGATIVE NEGATIVE Final   Influenza B by PCR NEGATIVE NEGATIVE Final    Comment: (NOTE) The Xpert Xpress SARS-CoV-2/FLU/RSV plus assay is intended as an aid in the diagnosis of influenza from Nasopharyngeal swab specimens and should not be used as a sole basis for treatment. Nasal washings and aspirates are unacceptable for Xpert Xpress SARS-CoV-2/FLU/RSV testing.  Fact Sheet for Patients: bloggercourse.com  Fact Sheet for Healthcare Providers: seriousbroker.it  This test is not yet approved or cleared by the [place]  FDA and has been  authorized for detection and/or diagnosis of SARS-CoV-2 by FDA under an Emergency Use Authorization (EUA). This EUA will remain in effect (meaning this test can be used) for the duration of the COVID-19 declaration under Section 564(b)(1) of the Act, 21 U.S.C. section 360bbb-3(b)(1), unless the authorization is terminated or revoked.     Resp Syncytial Virus by PCR NEGATIVE NEGATIVE Final    Comment: (NOTE) Fact Sheet for Patients: bloggercourse.com  Fact Sheet for Healthcare Providers: seriousbroker.it  This test is not yet approved or cleared by the [place]  FDA and has been authorized for detection and/or diagnosis of SARS-CoV-2 by FDA under an Emergency Use Authorization (EUA). This EUA will remain in effect (meaning this test can be used) for the duration of the COVID-19 declaration under Section 564(b)(1) of the Act, 21 U.S.C. section 360bbb-3(b)(1), unless the authorization is terminated or revoked.  Performed at [facility], [address] [place], [place] [address]     RADIOLOGY STUDIES/RESULTS: DG Chest 2 View Result Date: 11/20/2024 CLINICAL DATA:  Shortness of breath EXAM: CHEST - 2 VIEW COMPARISON:  Chest radiograph dated 10/27/2024 FINDINGS: Hyperinflated lungs. Minimal bibasilar patchy opacities. No pleural effusion or pneumothorax. The heart size and mediastinal contours are within normal limits. No acute osseous abnormality. IMPRESSION: 1. Minimal bibasilar patchy opacities, likely atelectasis. Aspiration or pneumonia can be considered in the appropriate clinical setting. 2. Hyperinflated lungs, which can be seen in the setting of COPD. Electronically Signed   By: Limin  Xu M.D.   On: 11/20/2024 17:28     LOS: 1 day   Donalda Applebaum, MD  [facility]    To contact the attending provider between 7A-7P or the covering provider during after hours 7P-7A, please log into the web site  [email or web address] and access using universal [place] password for that web site. If you do not have the password, please call the hospital operator.  11/21/2024, 8:35 AM

## 2024-11-21 NOTE — Plan of Care (Signed)
  Problem: Clinical Measurements: Goal: Diagnostic test results will improve Outcome: Progressing Goal: Respiratory complications will improve Outcome: Progressing   Problem: Nutrition: Goal: Adequate nutrition will be maintained Outcome: Progressing   Problem: Coping: Goal: Level of anxiety will decrease Outcome: Progressing   Problem: Elimination: Goal: Will not experience complications related to bowel motility Outcome: Progressing   Problem: Activity: Goal: Ability to tolerate increased activity will improve Outcome: Progressing

## 2024-11-22 DIAGNOSIS — I2510 Atherosclerotic heart disease of native coronary artery without angina pectoris: Secondary | ICD-10-CM | POA: Diagnosis not present

## 2024-11-22 DIAGNOSIS — J441 Chronic obstructive pulmonary disease with (acute) exacerbation: Secondary | ICD-10-CM | POA: Diagnosis not present

## 2024-11-22 DIAGNOSIS — J189 Pneumonia, unspecified organism: Secondary | ICD-10-CM | POA: Diagnosis not present

## 2024-11-22 DIAGNOSIS — J9621 Acute and chronic respiratory failure with hypoxia: Secondary | ICD-10-CM | POA: Diagnosis not present

## 2024-11-22 MED ORDER — ACETAMINOPHEN 325 MG PO TABS
650.0000 mg | ORAL_TABLET | Freq: Four times a day (QID) | ORAL | Status: DC | PRN
  Administered 2024-11-22 – 2024-11-24 (×5): 650 mg via ORAL
  Filled 2024-11-22 (×5): qty 2

## 2024-11-22 MED ORDER — IPRATROPIUM-ALBUTEROL 0.5-2.5 (3) MG/3ML IN SOLN
3.0000 mL | Freq: Three times a day (TID) | RESPIRATORY_TRACT | Status: DC
  Administered 2024-11-22 – 2024-11-24 (×6): 3 mL via RESPIRATORY_TRACT
  Filled 2024-11-22 (×6): qty 3

## 2024-11-22 NOTE — Progress Notes (Signed)
 PROGRESS NOTE        PATIENT DETAILS Name: Candace Middleton Age: 63 y.o. Sex: female Date of Birth: Dec 31, 1960 Admit Date: 11/20/2024 Admitting Physician Emery LITTIE Fuss, MD PCP:Pcp, No  Brief Summary: Patient is a 63 y.o.  female with history of COPD, tobacco use, HLD, HTN who had a runny nose for the past several days and then had gradually progressive cough/shortness of breath-presented to the ED with worsening shortness of breath on 11/27-found to have COPD exacerbation and subsequently admitted to the hospitalist service  Significant events: 11/27>> admit to [facility]  Significant studies: 08/19>> echo: EF 55-16, RV systolic function normal. 10/20>> nuclear stress test: Low risk-no evidence of ischemia or infarction. 11/27>> CXR: Minimal bibasilar patchy opacities-atelectasis versus aspiration/pneumonia.   Significant microbiology data: 11/28>> COVID/influenza/RSV PCR: Negative  Procedures: None  Consults: None  Subjective: Overall doing well-is slowly improving-feels fine at rest but gets very short winded and starts wheezing with ambulation in the room.  Objective: Vitals: Blood pressure (!) 100/57, pulse 79, temperature 97.8 F (36.6 C), temperature source Axillary, resp. rate 16, height 4' 9 (1.448 m), weight 34 kg, SpO2 95%.   Exam: Gen Exam:Alert awake-not in any distress HEENT:atraumatic, normocephalic Chest: Diminished air entry-no continues to wheeze. CVS:S1S2 regular Abdomen:soft non tender, non distended Extremities:no edema Neurology: Non focal Skin: no rash  Pertinent Labs/Radiology:    Latest Ref Rng & Units 11/21/2024    3:47 AM 11/21/2024   12:13 AM 11/20/2024    5:00 PM  CBC  WBC 4.0 - 10.5 K/uL 9.6  8.9  9.4   Hemoglobin 12.0 - 15.0 g/dL 84.3  83.4  83.4   Hematocrit 36.0 - 46.0 % 45.6  49.5  48.8   Platelets 150 - 400 K/uL 272  266  283     Lab Results  Component Value Date   NA 139 11/21/2024   K 4.6  11/21/2024   CL 105 11/21/2024   CO2 22 11/21/2024    Assessment/Plan: COPD exacerbation Slowly improving-feels fine at rest but still gets short winded/wheezing with activity (examined at rest and then after she ambulated in the room) Continue IV steroids Scheduled bronchodilators No signs of volume overload-do not think she requires diuretics (recent echo/stress test stable) Incentive spirometry/flutter valve Mobilize is much as possible Will need evaluation for home O2 prior to discharge.  PNA More cough than usual baseline Chest x-ray showing either infiltrates versus atelectasis Afebrile-nontoxic-appearing-no leukocytosis Completed Zithromax  x 3 days On Rocephin  x 5 days total.  HTN BP stable Amlodipine   HLD Statin  Tobacco abuse Counseled Claims she has attempting to quit-and has cut down quite a bit. Does not want a transdermal nicotine  patch.  Code status:   Code Status: Full Code   DVT Prophylaxis: enoxaparin  (LOVENOX ) injection 30 mg Start: 11/21/24 1000   Family Communication: None at bedside   Disposition Plan: Status is: Inpatient Remains inpatient appropriate because: Severity of illness   Planned Discharge Destination:Home   Diet: Diet Order             Diet Heart Room service appropriate? Yes; Fluid consistency: Thin  Diet effective now                     Antimicrobial agents: Anti-infectives (From admission, onward)    Start     Dose/Rate Route Frequency Ordered Stop  11/21/24 2200  cefTRIAXone  (ROCEPHIN ) 1 g in sodium chloride  0.9 % 100 mL IVPB  Status:  Discontinued        1 g 200 mL/hr over 30 Minutes Intravenous Every 24 hours 11/20/24 2339 11/21/24 0839   11/21/24 2200  azithromycin  (ZITHROMAX ) 500 mg in sodium chloride  0.9 % 250 mL IVPB  Status:  Discontinued        500 mg 250 mL/hr over 60 Minutes Intravenous Every 24 hours 11/20/24 2339 11/21/24 0840   11/21/24 2200  cefTRIAXone  (ROCEPHIN ) 2 g in sodium chloride  0.9 %  100 mL IVPB        2 g 200 mL/hr over 30 Minutes Intravenous Every 24 hours 11/21/24 0839 11/26/24 2159   11/21/24 1000  azithromycin  (ZITHROMAX ) tablet 500 mg        500 mg Oral Daily 11/21/24 0840 11/22/24 1009   11/20/24 2345  cefTRIAXone  (ROCEPHIN ) 2 g in sodium chloride  0.9 % 100 mL IVPB  Status:  Discontinued        2 g 200 mL/hr over 30 Minutes Intravenous Every 24 hours 11/20/24 2332 11/20/24 2338   11/20/24 2345  azithromycin  (ZITHROMAX ) 500 mg in sodium chloride  0.9 % 250 mL IVPB  Status:  Discontinued        500 mg 250 mL/hr over 60 Minutes Intravenous Every 24 hours 11/20/24 2332 11/20/24 2339   11/20/24 2245  cefTRIAXone  (ROCEPHIN ) 1 g in sodium chloride  0.9 % 100 mL IVPB        1 g 200 mL/hr over 30 Minutes Intravenous  Once 11/20/24 2238 11/21/24 0110   11/20/24 2245  azithromycin  (ZITHROMAX ) 500 mg in sodium chloride  0.9 % 250 mL IVPB        500 mg 250 mL/hr over 60 Minutes Intravenous  Once 11/20/24 2238 11/21/24 0252        MEDICATIONS: Scheduled Meds:  amLODipine   2.5 mg Oral QHS   arformoterol  15 mcg Nebulization BID   atorvastatin   20 mg Oral QPM   budesonide  (PULMICORT ) nebulizer solution  0.25 mg Nebulization BID   enoxaparin  (LOVENOX ) injection  30 mg Subcutaneous Daily   fluticasone   2 spray Each Nare Daily   ipratropium-albuterol   3 mL Nebulization TID   methylPREDNISolone  (SOLU-MEDROL ) injection  80 mg Intravenous Daily   montelukast   10 mg Oral Daily   pantoprazole  40 mg Oral Q1200   revefenacin  175 mcg Nebulization Daily   Continuous Infusions:  cefTRIAXone  (ROCEPHIN )  IV 2 g (11/21/24 2202)   PRN Meds:.albuterol , benzonatate , oxymetazoline   I have personally reviewed following labs and imaging studies  LABORATORY DATA: CBC: Recent Labs  Lab 11/20/24 1700 11/21/24 0013 11/21/24 0347  WBC 9.4 8.9 9.6  HGB 16.5* 16.5* 15.6*  HCT 48.8* 49.5* 45.6  MCV 95.3 96.5 95.4  PLT 283 266 272    Basic Metabolic Panel: Recent Labs  Lab  11/20/24 1700 11/21/24 0013 11/21/24 0347  NA 137  --  139  K 4.5  --  4.6  CL 99  --  105  CO2 26  --  22  GLUCOSE 146*  --  193*  BUN 13  --  12  CREATININE 0.86 0.80 0.79  CALCIUM  9.5  --  8.6*    GFR: Estimated Creatinine Clearance: 37.6 mL/min (by C-G formula based on SCr of 0.79 mg/dL).  Liver Function Tests: Recent Labs  Lab 11/21/24 0347  AST 22  ALT 23  ALKPHOS 70  BILITOT 0.5  PROT 5.7*  ALBUMIN 3.2*   No results for input(s): LIPASE, AMYLASE in the last 168 hours. No results for input(s): AMMONIA in the last 168 hours.  Coagulation Profile: No results for input(s): INR, PROTIME in the last 168 hours.  Cardiac Enzymes: No results for input(s): CKTOTAL, CKMB, CKMBINDEX, TROPONINI in the last 168 hours.  BNP (last 3 results) No results for input(s): PROBNP in the last 8760 hours.  Lipid Profile: No results for input(s): CHOL, HDL, LDLCALC, TRIG, CHOLHDL, LDLDIRECT in the last 72 hours.  Thyroid  Function Tests: No results for input(s): TSH, T4TOTAL, FREET4, T3FREE, THYROIDAB in the last 72 hours.  Anemia Panel: No results for input(s): VITAMINB12, FOLATE, FERRITIN, TIBC, IRON, RETICCTPCT in the last 72 hours.  Urine analysis:    Component Value Date/Time   COLORURINE YELLOW 04/15/2024 0348   APPEARANCEUR HAZY (A) 04/15/2024 0348   LABSPEC 1.010 04/15/2024 0348   PHURINE 6.0 04/15/2024 0348   GLUCOSEU NEGATIVE 04/15/2024 0348   HGBUR NEGATIVE 04/15/2024 0348   BILIRUBINUR NEGATIVE 04/15/2024 0348   BILIRUBINUR neg 08/23/2015 1233   KETONESUR 5 (A) 04/15/2024 0348   PROTEINUR NEGATIVE 04/15/2024 0348   UROBILINOGEN 0.2 08/23/2015 1233   UROBILINOGEN 0.2 06/21/2015 1337   NITRITE POSITIVE (A) 04/15/2024 0348   LEUKOCYTESUR MODERATE (A) 04/15/2024 0348    Sepsis Labs: Lactic Acid, Venous    Component Value Date/Time   LATICACIDVEN 0.9 11/21/2024 0015    MICROBIOLOGY: Recent Results  (from the past 240 hours)  Resp panel by RT-PCR (RSV, Flu A&B, Covid) Anterior Nasal Swab     Status: None   Collection Time: 11/21/24  1:35 AM   Specimen: Anterior Nasal Swab  Result Value Ref Range Status   SARS Coronavirus 2 by RT PCR NEGATIVE NEGATIVE Final   Influenza A by PCR NEGATIVE NEGATIVE Final   Influenza B by PCR NEGATIVE NEGATIVE Final    Comment: (NOTE) The Xpert Xpress SARS-CoV-2/FLU/RSV plus assay is intended as an aid in the diagnosis of influenza from Nasopharyngeal swab specimens and should not be used as a sole basis for treatment. Nasal washings and aspirates are unacceptable for Xpert Xpress SARS-CoV-2/FLU/RSV testing.  Fact Sheet for Patients: bloggercourse.com  Fact Sheet for Healthcare Providers: seriousbroker.it  This test is not yet approved or cleared by the [place]  FDA and has been authorized for detection and/or diagnosis of SARS-CoV-2 by FDA under an Emergency Use Authorization (EUA). This EUA will remain in effect (meaning this test can be used) for the duration of the COVID-19 declaration under Section 564(b)(1) of the Act, 21 U.S.C. section 360bbb-3(b)(1), unless the authorization is terminated or revoked.     Resp Syncytial Virus by PCR NEGATIVE NEGATIVE Final    Comment: (NOTE) Fact Sheet for Patients: bloggercourse.com  Fact Sheet for Healthcare Providers: seriousbroker.it  This test is not yet approved or cleared by the [place]  FDA and has been authorized for detection and/or diagnosis of SARS-CoV-2 by FDA under an Emergency Use Authorization (EUA). This EUA will remain in effect (meaning this test can be used) for the duration of the COVID-19 declaration under Section 564(b)(1) of the Act, 21 U.S.C. section 360bbb-3(b)(1), unless the authorization is terminated or revoked.  Performed at [facility], [address] [place], [place] [address]     RADIOLOGY STUDIES/RESULTS: DG Chest 2 View Result Date: 11/20/2024 CLINICAL DATA:  Shortness of breath EXAM: CHEST - 2 VIEW COMPARISON:  Chest radiograph dated 10/27/2024 FINDINGS: Hyperinflated lungs. Minimal bibasilar patchy opacities. No pleural effusion  or pneumothorax. The heart size and mediastinal contours are within normal limits. No acute osseous abnormality. IMPRESSION: 1. Minimal bibasilar patchy opacities, likely atelectasis. Aspiration or pneumonia can be considered in the appropriate clinical setting. 2. Hyperinflated lungs, which can be seen in the setting of COPD. Electronically Signed   By: Limin  Xu M.D.   On: 11/20/2024 17:28     LOS: 2 days   Donalda Applebaum, MD  [facility]    To contact the attending provider between 7A-7P or the covering provider during after hours 7P-7A, please log into the web site [email or web address] and access using universal [place] password for that web site. If you do not have the password, please call the hospital operator.  11/22/2024, 10:31 AM

## 2024-11-22 NOTE — Plan of Care (Signed)
 Patient ID: Candace Middleton, female   DOB: 1961/07/06, 63 y.o.   MRN: [id number]  Problem: Education: Goal: Knowledge of General Education information will improve Description: Including pain rating scale, medication(s)/side effects and non-pharmacologic comfort measures Outcome: Progressing   Problem: Health Behavior/Discharge Planning: Goal: Ability to manage health-related needs will improve Outcome: Progressing   Problem: Clinical Measurements: Goal: Ability to maintain clinical measurements within normal limits will improve Outcome: Progressing Goal: Will remain free from infection Outcome: Progressing Goal: Diagnostic test results will improve Outcome: Progressing Goal: Respiratory complications will improve Outcome: Progressing Goal: Cardiovascular complication will be avoided Outcome: Progressing   Problem: Activity: Goal: Risk for activity intolerance will decrease Outcome: Progressing   Problem: Nutrition: Goal: Adequate nutrition will be maintained Outcome: Progressing   Problem: Coping: Goal: Level of anxiety will decrease Outcome: Progressing   Problem: Elimination: Goal: Will not experience complications related to bowel motility Outcome: Progressing Goal: Will not experience complications related to urinary retention Outcome: Progressing   Problem: Pain Managment: Goal: General experience of comfort will improve and/or be controlled Outcome: Progressing   Problem: Safety: Goal: Ability to remain free from injury will improve Outcome: Progressing   Problem: Skin Integrity: Goal: Risk for impaired skin integrity will decrease Outcome: Progressing   Problem: Activity: Goal: Ability to tolerate increased activity will improve Outcome: Progressing   Problem: Clinical Measurements: Goal: Ability to maintain a body temperature in the normal range will improve Outcome: Progressing   Problem: Respiratory: Goal: Ability to maintain adequate  ventilation will improve Outcome: Progressing Goal: Ability to maintain a clear airway will improve Outcome: Progressing  Verdie JONETTA Collier, RN

## 2024-11-23 DIAGNOSIS — J9621 Acute and chronic respiratory failure with hypoxia: Secondary | ICD-10-CM | POA: Diagnosis not present

## 2024-11-23 LAB — BASIC METABOLIC PANEL WITH GFR
Anion gap: 11 (ref 5–15)
BUN: 11 mg/dL (ref 8–23)
CO2: 26 mmol/L (ref 22–32)
Calcium: 8.8 mg/dL — ABNORMAL LOW (ref 8.9–10.3)
Chloride: 101 mmol/L (ref 98–111)
Creatinine, Ser: 0.61 mg/dL (ref 0.44–1.00)
GFR, Estimated: >60 mL/min (ref >60)
Glucose, Bld: 153 mg/dL — ABNORMAL HIGH (ref 70–99)
Potassium: 4.3 mmol/L (ref 3.5–5.1)
Sodium: 138 mmol/L (ref 135–145)

## 2024-11-23 LAB — BRAIN NATRIURETIC PEPTIDE: B Natriuretic Peptide: 63.7 pg/mL (ref 0.0–100.0)

## 2024-11-23 MED ORDER — FUROSEMIDE 10 MG/ML IJ SOLN
40.0000 mg | Freq: Once | INTRAMUSCULAR | Status: AC
  Administered 2024-11-23: 40 mg via INTRAVENOUS
  Filled 2024-11-23: qty 4

## 2024-11-23 NOTE — Plan of Care (Signed)

## 2024-11-23 NOTE — Progress Notes (Signed)
 PROGRESS NOTE        PATIENT DETAILS Name: Candace Middleton Age: 63 y.o. Sex: female Date of Birth: January 08, 1961 Admit Date: 11/20/2024 Admitting Physician Emery LITTIE Fuss, MD PCP:Pcp, No  Brief Summary: Patient is a 63 y.o.  female with history of COPD, tobacco use, HLD, HTN who had a runny nose for the past several days and then had gradually progressive cough/shortness of breath-presented to the ED with worsening shortness of breath on 11/27-found to have COPD exacerbation and subsequently admitted to the hospitalist service  Significant events: 11/27>> admit to [facility]  Significant studies: 08/19>> echo: EF 55-16, RV systolic function normal. 10/20>> nuclear stress test: Low risk-no evidence of ischemia or infarction. 11/27>> CXR: Minimal bibasilar patchy opacities-atelectasis versus aspiration/pneumonia.   Significant microbiology data: 11/28>> COVID/influenza/RSV PCR: Negative  Procedures: None  Consults: None  Subjective: Patient in bed, appears comfortable, denies any headache, no fever, no chest pain or pressure, still has cough, wheezing along with shortness of breath , no abdominal pain. No focal weakness.  Objective: Vitals: Blood pressure 133/68, pulse 71, temperature (!) 97.3 F (36.3 C), temperature source Oral, resp. rate 18, height 4' 9 (1.448 m), weight 34 kg, SpO2 94%.   Exam:  Awake Alert, No new F.N deficits, Normal affect [place].AT,PERRAL Supple Neck, No JVD,   Symmetrical Chest wall movement, Good air movement bilaterally, diffuse bilateral wheezes RRR,No Gallops, Rubs or new Murmurs,  +ve B.Sounds, Abd Soft, No tenderness,   No Cyanosis, Clubbing or edema   Assessment/Plan: COPD exacerbation Slowly improving-feels fine at rest but still gets short winded/wheezing with activity (examined at rest and then after she ambulated in the room) Continue IV steroids, trial of IV Lasix Scheduled bronchodilators No signs of  volume overload-do not think she requires diuretics (recent echo/stress test stable) Incentive spirometry/flutter valve Mobilize is much as possible Will need evaluation for home O2 prior to discharge.  PNA More cough than usual baseline Chest x-ray showing either infiltrates versus atelectasis Afebrile-nontoxic-appearing-no leukocytosis Completed Zithromax  x 3 days On Rocephin  x 5 days total.  HTN BP stable Amlodipine   HLD Statin  Tobacco abuse Counseled Claims she has attempting to quit-and has cut down quite a bit. Does not want a transdermal nicotine  patch.  Code status:   Code Status: Full Code   DVT Prophylaxis: enoxaparin  (LOVENOX ) injection 30 mg Start: 11/21/24 1000   Family Communication: None at bedside   Disposition Plan: Status is: Inpatient Remains inpatient appropriate because: Severity of illness   Planned Discharge Destination:Home   Diet: Diet Order             Diet Heart Room service appropriate? Yes; Fluid consistency: Thin  Diet effective now                     Antimicrobial agents: Anti-infectives (From admission, onward)    Start     Dose/Rate Route Frequency Ordered Stop   11/21/24 2200  cefTRIAXone  (ROCEPHIN ) 1 g in sodium chloride  0.9 % 100 mL IVPB  Status:  Discontinued        1 g 200 mL/hr over 30 Minutes Intravenous Every 24 hours 11/20/24 2339 11/21/24 0839   11/21/24 2200  azithromycin  (ZITHROMAX ) 500 mg in sodium chloride  0.9 % 250 mL IVPB  Status:  Discontinued        500 mg 250 mL/hr over 60  Minutes Intravenous Every 24 hours 11/20/24 2339 11/21/24 0840   11/21/24 2200  cefTRIAXone  (ROCEPHIN ) 2 g in sodium chloride  0.9 % 100 mL IVPB        2 g 200 mL/hr over 30 Minutes Intravenous Every 24 hours 11/21/24 0839 11/26/24 2159   11/21/24 1000  azithromycin  (ZITHROMAX ) tablet 500 mg        500 mg Oral Daily 11/21/24 0840 11/22/24 1009   11/20/24 2345  cefTRIAXone  (ROCEPHIN ) 2 g in sodium chloride  0.9 % 100 mL IVPB   Status:  Discontinued        2 g 200 mL/hr over 30 Minutes Intravenous Every 24 hours 11/20/24 2332 11/20/24 2338   11/20/24 2345  azithromycin  (ZITHROMAX ) 500 mg in sodium chloride  0.9 % 250 mL IVPB  Status:  Discontinued        500 mg 250 mL/hr over 60 Minutes Intravenous Every 24 hours 11/20/24 2332 11/20/24 2339   11/20/24 2245  cefTRIAXone  (ROCEPHIN ) 1 g in sodium chloride  0.9 % 100 mL IVPB        1 g 200 mL/hr over 30 Minutes Intravenous  Once 11/20/24 2238 11/21/24 0110   11/20/24 2245  azithromycin  (ZITHROMAX ) 500 mg in sodium chloride  0.9 % 250 mL IVPB        500 mg 250 mL/hr over 60 Minutes Intravenous  Once 11/20/24 2238 11/21/24 0252        MEDICATIONS: Scheduled Meds:  amLODipine   2.5 mg Oral QHS   arformoterol  15 mcg Nebulization BID   atorvastatin   20 mg Oral QPM   budesonide  (PULMICORT ) nebulizer solution  0.25 mg Nebulization BID   enoxaparin  (LOVENOX ) injection  30 mg Subcutaneous Daily   fluticasone   2 spray Each Nare Daily   ipratropium-albuterol   3 mL Nebulization TID   methylPREDNISolone  (SOLU-MEDROL ) injection  80 mg Intravenous Daily   montelukast   10 mg Oral Daily   pantoprazole  40 mg Oral Q1200   revefenacin  175 mcg Nebulization Daily   Continuous Infusions:  cefTRIAXone  (ROCEPHIN )  IV Stopped (11/22/24 2206)   PRN Meds:.acetaminophen , albuterol , benzonatate , oxymetazoline   I have personally reviewed following labs and imaging studies  LABORATORY DATA: CBC: Recent Labs  Lab 11/20/24 1700 11/21/24 0013 11/21/24 0347  WBC 9.4 8.9 9.6  HGB 16.5* 16.5* 15.6*  HCT 48.8* 49.5* 45.6  MCV 95.3 96.5 95.4  PLT 283 266 272    Basic Metabolic Panel: Recent Labs  Lab 11/20/24 1700 11/21/24 0013 11/21/24 0347 11/23/24 0349  NA 137  --  139 138  K 4.5  --  4.6 4.3  CL 99  --  105 101  CO2 26  --  22 26  GLUCOSE 146*  --  193* 153*  BUN 13  --  12 11  CREATININE 0.86 0.80 0.79 0.61  CALCIUM  9.5  --  8.6* 8.8*    GFR: Estimated  Creatinine Clearance: 37.6 mL/min (by C-G formula based on SCr of 0.61 mg/dL).  Liver Function Tests: Recent Labs  Lab 11/21/24 0347  AST 22  ALT 23  ALKPHOS 70  BILITOT 0.5  PROT 5.7*  ALBUMIN 3.2*   No results for input(s): LIPASE, AMYLASE in the last 168 hours. No results for input(s): AMMONIA in the last 168 hours.  Coagulation Profile: No results for input(s): INR, PROTIME in the last 168 hours.  Cardiac Enzymes: No results for input(s): CKTOTAL, CKMB, CKMBINDEX, TROPONINI in the last 168 hours.  BNP (last 3 results) No results for input(s): PROBNP  in the last 8760 hours.  Lipid Profile: No results for input(s): CHOL, HDL, LDLCALC, TRIG, CHOLHDL, LDLDIRECT in the last 72 hours.  Thyroid  Function Tests: No results for input(s): TSH, T4TOTAL, FREET4, T3FREE, THYROIDAB in the last 72 hours.  Anemia Panel: No results for input(s): VITAMINB12, FOLATE, FERRITIN, TIBC, IRON, RETICCTPCT in the last 72 hours.  Urine analysis:    Component Value Date/Time   COLORURINE YELLOW 04/15/2024 0348   APPEARANCEUR HAZY (A) 04/15/2024 0348   LABSPEC 1.010 04/15/2024 0348   PHURINE 6.0 04/15/2024 0348   GLUCOSEU NEGATIVE 04/15/2024 0348   HGBUR NEGATIVE 04/15/2024 0348   BILIRUBINUR NEGATIVE 04/15/2024 0348   BILIRUBINUR neg 08/23/2015 1233   KETONESUR 5 (A) 04/15/2024 0348   PROTEINUR NEGATIVE 04/15/2024 0348   UROBILINOGEN 0.2 08/23/2015 1233   UROBILINOGEN 0.2 06/21/2015 1337   NITRITE POSITIVE (A) 04/15/2024 0348   LEUKOCYTESUR MODERATE (A) 04/15/2024 0348    Sepsis Labs: Lactic Acid, Venous    Component Value Date/Time   LATICACIDVEN 0.9 11/21/2024 0015    MICROBIOLOGY: Recent Results (from the past 240 hours)  Resp panel by RT-PCR (RSV, Flu A&B, Covid) Anterior Nasal Swab     Status: None   Collection Time: 11/21/24  1:35 AM   Specimen: Anterior Nasal Swab  Result Value Ref Range Status   SARS Coronavirus 2  by RT PCR NEGATIVE NEGATIVE Final   Influenza A by PCR NEGATIVE NEGATIVE Final   Influenza B by PCR NEGATIVE NEGATIVE Final    Comment: (NOTE) The Xpert Xpress SARS-CoV-2/FLU/RSV plus assay is intended as an aid in the diagnosis of influenza from Nasopharyngeal swab specimens and should not be used as a sole basis for treatment. Nasal washings and aspirates are unacceptable for Xpert Xpress SARS-CoV-2/FLU/RSV testing.  Fact Sheet for Patients: bloggercourse.com  Fact Sheet for Healthcare Providers: seriousbroker.it  This test is not yet approved or cleared by the [place]  FDA and has been authorized for detection and/or diagnosis of SARS-CoV-2 by FDA under an Emergency Use Authorization (EUA). This EUA will remain in effect (meaning this test can be used) for the duration of the COVID-19 declaration under Section 564(b)(1) of the Act, 21 U.S.C. section 360bbb-3(b)(1), unless the authorization is terminated or revoked.     Resp Syncytial Virus by PCR NEGATIVE NEGATIVE Final    Comment: (NOTE) Fact Sheet for Patients: bloggercourse.com  Fact Sheet for Healthcare Providers: seriousbroker.it  This test is not yet approved or cleared by the [place]  FDA and has been authorized for detection and/or diagnosis of SARS-CoV-2 by FDA under an Emergency Use Authorization (EUA). This EUA will remain in effect (meaning this test can be used) for the duration of the COVID-19 declaration under Section 564(b)(1) of the Act, 21 U.S.C. section 360bbb-3(b)(1), unless the authorization is terminated or revoked.  Performed at [facility], [address] [place], [place] [address]     RADIOLOGY STUDIES/RESULTS: No results found.    LOS: 3 days   Candace Stank, MD  [facility]    To contact the attending provider between 7A-7P or the covering provider during  after hours 7P-7A, please log into the web site [email or web address] and access using universal [place] password for that web site. If you do not have the password, please call the hospital operator.  11/23/2024, 9:38 AM

## 2024-11-24 ENCOUNTER — Other Ambulatory Visit (HOSPITAL_COMMUNITY): Payer: Self-pay

## 2024-11-24 DIAGNOSIS — J9621 Acute and chronic respiratory failure with hypoxia: Secondary | ICD-10-CM | POA: Diagnosis not present

## 2024-11-24 MED ORDER — DOXYCYCLINE HYCLATE 100 MG PO TABS
100.0000 mg | ORAL_TABLET | Freq: Two times a day (BID) | ORAL | 0 refills | Status: DC
  Filled 2024-11-24: qty 10, 5d supply, fill #0

## 2024-11-24 MED ORDER — PANTOPRAZOLE SODIUM 40 MG PO TBEC
40.0000 mg | DELAYED_RELEASE_TABLET | Freq: Every day | ORAL | 0 refills | Status: DC
  Filled 2024-11-24: qty 30, 30d supply, fill #0

## 2024-11-24 MED ORDER — PREDNISONE 5 MG PO TABS
ORAL_TABLET | ORAL | 0 refills | Status: DC
  Filled 2024-11-24: qty 95, 21d supply, fill #0

## 2024-11-24 NOTE — Progress Notes (Signed)
 SATURATION QUALIFICATIONS: (This note is used to comply with regulatory documentation for home oxygen)  Patient Saturations on Room Air at Rest = 92%  Patient Saturations on Room Air while Ambulating = 87%  Patient Saturations on 2 Liters of oxygen while Ambulating = 95%  Please briefly explain why patient needs home oxygen: Pt desats with activity quickly. Pt needs Oxygen for daily activities

## 2024-11-24 NOTE — Discharge Summary (Signed)
 Discharge summary note.  Candace Middleton FMW:[id number] DOB: 05-Jan-1961 DOA: 11/20/2024  PCP: Pcp, No  Admit date: 11/20/2024  Discharge date: 11/24/2024  Admitted From: Home   Disposition:  Home   Recommendations for Outpatient Follow-up:   Follow up with PCP in 1-2 weeks  PCP Please obtain BMP/CBC, 2 view CXR in 1week,  (see Discharge instructions)   PCP Please follow up on the following pending results:    Home Health: None   Equipment/Devices: o2 if qualifies  Consultations: None  Discharge Condition: Stable    CODE STATUS: Full    Diet Recommendation: Heart Healthy     Chief Complaint  Patient presents with   Shortness of Breath     Brief history of present illness from the day of admission and additional interim summary    63 y.o.  female with history of COPD, tobacco use, HLD, HTN who had a runny nose for the past several days and then had gradually progressive cough/shortness of breath-presented to the ED with worsening shortness of breath on 11/27-found to have COPD exacerbation and subsequently admitted to the hospitalist service   Significant events: 11/27>> admit to [facility]   Significant studies: 08/19>> echo: EF 55-16, RV systolic function normal. 10/20>> nuclear stress test: Low risk-no evidence of ischemia or infarction. 11/27>> CXR: Minimal bibasilar patchy opacities-atelectasis versus aspiration/pneumonia.     Significant microbiology data: 11/28>> COVID/influenza/RSV PCR: Negative                                                                 Hospital Course   Acute on chronic COPD exacerbation causing acute hypoxic respiratory failure  Seems to have advanced underlying COPD, extremely frail at baseline, still smoking, counseled to quit, recent admission for COPD  exacerbation few months ago, no fever or leukocytosis, cultures negative.  Recent echo was unremarkable, she was treated here with IV steroids, empiric antibiotic for atypical coverage along with nebulizer treatments, was encouraged to sit in chair use I-S and flutter valve for pulmonary toiletry.  Overall significantly better, still requiring 2 L nasal cannula oxygen which she will get for home if she qualifies for, ambulatory pulse ox evaluation today.    Will be discharged home on oral steroid taper few more days of oral antibiotics with outpatient PCP and pulmonary follow-up.  She follows with pulmonary on a regular basis according to her.  She has been strictly counseled to abstain from further smoking.     PNA More cough than usual baseline Chest x-ray showing either infiltrates versus atelectasis Afebrile-nontoxic-appearing-no leukocytosis Was given empiric antibiotics here 5 more days of oral doxycycline  upon discharge, will have her follow-up with her pulmonologist in 7 to 10 days as well.  May benefit from outpatient CT chest.  Of note patient's partner is  positive for HIV and she takes Biktarvy  prophylactic.   HTN BP stable Amlodipine    HLD Statin   Tobacco abuse Counseled Claims she has attempting to quit-and has cut down quite a bit. Does not want a transdermal nicotine  patch.    Discharge diagnosis     Principal Problem:   Acute on chronic hypoxic respiratory failure (HCC) Active Problems:   Coronary artery disease   HLD (hyperlipidemia)   Tobacco abuse   COPD with acute exacerbation (HCC)   CAP (community acquired pneumonia)    Discharge instructions    Discharge Instructions     Diet - low sodium heart healthy   Complete by: As directed    Discharge instructions   Complete by: As directed    Follow with Primary MD and your pulmonologist in 7 days   Get CBC, CMP, Magnesium, 2 view Chest X ray -  checked next visit with your primary MD     Activity: As  tolerated with Full fall precautions use walker/cane & assistance as needed  Disposition Home     Diet: Heart Healthy    Special Instructions: If you have smoked or chewed Tobacco  in the last 2 yrs please stop smoking, stop any regular Alcohol   and or any Recreational drug use.  On your next visit with your primary care physician please Get Medicines reviewed and adjusted.  Please request your Prim.MD to go over all Hospital Tests and Procedure/Radiological results at the follow up, please get all Hospital records sent to your Prim MD by signing hospital release before you go home.  If you experience worsening of your admission symptoms, develop shortness of breath, life threatening emergency, suicidal or homicidal thoughts you must seek medical attention immediately by calling 911 or calling your MD immediately  if symptoms less severe.  You Must read complete instructions/literature along with all the possible adverse reactions/side effects for all the Medicines you take and that have been prescribed to you. Take any new Medicines after you have completely understood and accpet all the possible adverse reactions/side effects.   Do not drive when taking Pain medications.  Do not take more than prescribed Pain, Sleep and Anxiety Medications  Wear Seat belts while driving.   Increase activity slowly   Complete by: As directed        Discharge Medications   Allergies as of 11/24/2024       Reactions   Gabapentin  Hives   Patient reports facial swelling, hives, facial involvement    Sulfa Antibiotics Anaphylaxis   Onset at age 78, almost died because I stopped breathing.        Medication List     TAKE these medications    albuterol  108 (90 Base) MCG/ACT inhaler Commonly known as: VENTOLIN  HFA Inhale 2 puffs into the lungs every 6 (six) hours as needed.   albuterol  (2.5 MG/3ML) 0.083% nebulizer solution Commonly known as: PROVENTIL  Take 3 mLs (2.5 mg total) by  nebulization every 6 (six) hours as needed for wheezing or shortness of breath.   amLODipine  2.5 MG tablet Commonly known as: NORVASC  Take 1 tablet (2.5 mg total) by mouth at bedtime.   aspirin  EC 81 MG tablet Take 1 tablet (81 mg total) by mouth daily.   atorvastatin  20 MG tablet Commonly known as: LIPITOR TAKE 1 TABLET BY MOUTH EVERY DAY What changed: when to take this   bictegravir-emtricitabine-tenofovir AF 50-200-25 MG Tabs Prepack Commonly known as: BIKTARVY  Take 1 tablet by mouth daily.  budesonide -glycopyrrolate -formoterol  160-9-4.8 MCG/ACT Aero inhaler Commonly known as: BREZTRI  Inhale 2 puffs into the lungs 2 (two) times daily.   doxycycline  100 MG capsule Commonly known as: VIBRAMYCIN  Take 1 capsule (100 mg total) by mouth 2 (two) times daily.   montelukast  10 MG tablet Commonly known as: SINGULAIR  Take 10 mg by mouth daily.   Ohtuvayre  3 MG/2.5ML Susp Generic drug: Ensifentrine  Inhale 3 mg into the lungs in the morning and at bedtime.   oxyCODONE -acetaminophen  5-325 MG tablet Commonly known as: PERCOCET/ROXICET Take 1 tablet by mouth every 6 (six) hours as needed for moderate pain (pain score 4-6).   pantoprazole 40 MG tablet Commonly known as: PROTONIX Take 1 tablet (40 mg total) by mouth daily at 12 noon.   predniSONE  5 MG tablet Commonly known as: DELTASONE  Label  & dispense according to the schedule below. 10 Pills PO for 3 days then, 8 Pills PO for 3 days, 6 Pills PO for 3 days, 4 Pills PO for 3 days, 2 Pills PO for 3 days, 1 Pills PO for 3 days, 1/2 Pill  PO for 3 days then STOP. Total 95 pills.               Durable Medical Equipment  (From admission, onward)           Start     Ordered   11/24/24 0746  DME Oxygen  Once       Question Answer Comment  Length of Need 6 Months   Mode or (Route) Nasal cannula   Liters per Minute 2   Frequency Continuous (stationary and portable oxygen unit needed)   Oxygen conserving device Yes    Oxygen delivery system: Gas      11/24/24 0746             Follow-up Information     Your PCP and pulmonologist. Schedule an appointment as soon as possible for a visit in 1 week(s).                  Major procedures and Radiology Reports - PLEASE review detailed and final reports thoroughly  -      DG Chest 2 View Result Date: 11/20/2024 CLINICAL DATA:  Shortness of breath EXAM: CHEST - 2 VIEW COMPARISON:  Chest radiograph dated 10/27/2024 FINDINGS: Hyperinflated lungs. Minimal bibasilar patchy opacities. No pleural effusion or pneumothorax. The heart size and mediastinal contours are within normal limits. No acute osseous abnormality. IMPRESSION: 1. Minimal bibasilar patchy opacities, likely atelectasis. Aspiration or pneumonia can be considered in the appropriate clinical setting. 2. Hyperinflated lungs, which can be seen in the setting of COPD. Electronically Signed   By: Limin  Xu M.D.   On: 11/20/2024 17:28   DG Chest 2 View Result Date: 10/27/2024 CLINICAL DATA:  Acute bronchitis. EXAM: CHEST - 2 VIEW COMPARISON:  07/17/2024 and CT chest 08/27/2024. FINDINGS: Trachea is midline. Heart size normal. Lungs are hyperinflated and emphysematous but otherwise clear. No pleural fluid. Degenerative changes in the spine. IMPRESSION: 1. No acute findings. 2.  Emphysema (ICD10-J43.9). Electronically Signed   By: Newell Eke M.D.   On: 10/27/2024 10:52    Micro Results    Recent Results (from the past 240 hours)  Resp panel by RT-PCR (RSV, Flu A&B, Covid) Anterior Nasal Swab     Status: None   Collection Time: 11/21/24  1:35 AM   Specimen: Anterior Nasal Swab  Result Value Ref Range Status   SARS Coronavirus 2 by RT PCR NEGATIVE  NEGATIVE Final   Influenza A by PCR NEGATIVE NEGATIVE Final   Influenza B by PCR NEGATIVE NEGATIVE Final    Comment: (NOTE) The Xpert Xpress SARS-CoV-2/FLU/RSV plus assay is intended as an aid in the diagnosis of influenza from Nasopharyngeal  swab specimens and should not be used as a sole basis for treatment. Nasal washings and aspirates are unacceptable for Xpert Xpress SARS-CoV-2/FLU/RSV testing.  Fact Sheet for Patients: bloggercourse.com  Fact Sheet for Healthcare Providers: seriousbroker.it  This test is not yet approved or cleared by the [place]  FDA and has been authorized for detection and/or diagnosis of SARS-CoV-2 by FDA under an Emergency Use Authorization (EUA). This EUA will remain in effect (meaning this test can be used) for the duration of the COVID-19 declaration under Section 564(b)(1) of the Act, 21 U.S.C. section 360bbb-3(b)(1), unless the authorization is terminated or revoked.     Resp Syncytial Virus by PCR NEGATIVE NEGATIVE Final    Comment: (NOTE) Fact Sheet for Patients: bloggercourse.com  Fact Sheet for Healthcare Providers: seriousbroker.it  This test is not yet approved or cleared by the [place]  FDA and has been authorized for detection and/or diagnosis of SARS-CoV-2 by FDA under an Emergency Use Authorization (EUA). This EUA will remain in effect (meaning this test can be used) for the duration of the COVID-19 declaration under Section 564(b)(1) of the Act, 21 U.S.C. section 360bbb-3(b)(1), unless the authorization is terminated or revoked.  Performed at [facility], [address], [place], [place] [address]     Today   Subjective    Justice Aguirre today has no headache,no chest abdominal pain,no new weakness tingling or numbness, feels much better wants to go home today.     Objective   Blood pressure 118/82, pulse 82, temperature 98.3 F (36.8 C), temperature source Oral, resp. rate 16, height 4' 9 (1.448 m), weight 34 kg, SpO2 98%.   Intake/Output Summary (Last 24 hours) at 11/24/2024 0747 Last data filed at 11/24/2024 0400 Gross per 24 hour   Intake 340 ml  Output --  Net 340 ml    Exam  Awake Alert, No new F.N deficits,    [place].AT,PERRAL Supple Neck,   Symmetrical Chest wall movement, Mod air movement bilaterally, no wheezing today RRR,No Gallops,   +ve B.Sounds, Abd Soft, Non tender,  No Cyanosis, Clubbing or edema    Data Review   Recent Labs  Lab 11/20/24 1700 11/21/24 0013 11/21/24 0347  WBC 9.4 8.9 9.6  HGB 16.5* 16.5* 15.6*  HCT 48.8* 49.5* 45.6  PLT 283 266 272  MCV 95.3 96.5 95.4  MCH 32.2 32.2 32.6  MCHC 33.8 33.3 34.2  RDW 13.2 13.2 13.2    Recent Labs  Lab 11/20/24 1700 11/21/24 0013 11/21/24 0015 11/21/24 0347 11/23/24 0349 11/23/24 0746  NA 137  --   --  139 138  --   K 4.5  --   --  4.6 4.3  --   CL 99  --   --  105 101  --   CO2 26  --   --  22 26  --   ANIONGAP 12  --   --  12 11  --   GLUCOSE 146*  --   --  193* 153*  --   BUN 13  --   --  12 11  --   CREATININE 0.86 0.80  --  0.79 0.61  --   AST  --   --   --  22  --   --   ALT  --   --   --  23  --   --   ALKPHOS  --   --   --  70  --   --   BILITOT  --   --   --  0.5  --   --   ALBUMIN  --   --   --  3.2*  --   --   LATICACIDVEN  --   --  0.9  --   --   --   BNP 31.3  --   --   --   --  63.7  CALCIUM  9.5  --   --  8.6* 8.8*  --     Total Time in preparing paper work, data evaluation and todays exam - 35 minutes  Signature  -    Lavada Stank M.D on 11/24/2024 at 7:47 AM   -  To page go to [email or web address]

## 2024-11-24 NOTE — Discharge Instructions (Signed)
 Follow with Primary MD and your pulmonologist in 7 days   Get CBC, CMP, Magnesium, 2 view Chest X ray -  checked next visit with your primary MD     Activity: As tolerated with Full fall precautions use walker/cane & assistance as needed  Disposition Home     Diet: Heart Healthy    Special Instructions: If you have smoked or chewed Tobacco  in the last 2 yrs please stop smoking, stop any regular Alcohol   and or any Recreational drug use.  On your next visit with your primary care physician please Get Medicines reviewed and adjusted.  Please request your Prim.MD to go over all Hospital Tests and Procedure/Radiological results at the follow up, please get all Hospital records sent to your Prim MD by signing hospital release before you go home.  If you experience worsening of your admission symptoms, develop shortness of breath, life threatening emergency, suicidal or homicidal thoughts you must seek medical attention immediately by calling 911 or calling your MD immediately  if symptoms less severe.  You Must read complete instructions/literature along with all the possible adverse reactions/side effects for all the Medicines you take and that have been prescribed to you. Take any new Medicines after you have completely understood and accpet all the possible adverse reactions/side effects.   Do not drive when taking Pain medications.  Do not take more than prescribed Pain, Sleep and Anxiety Medications  Wear Seat belts while driving.

## 2024-11-24 NOTE — TOC Transition Note (Signed)
 Transition of Care [facility]) - Discharge Note   Patient Details  Name: Candace Middleton MRN: [id number] Date of Birth: 07-25-1961  Transition of [facility] CM/SW Contact:  Landry DELENA Senters, RN Phone Number: 11/24/2024, 12:01 PM   Clinical Narrative:     Patient discharging to home today, with transportation arranged.   Home oxygen ordered via [employer]. Oxygen to be delivered to patient room and then other supplies will be delivered to patient's home. Patient wanting POC. CM did speak with [employer] regarding POC. This information was placed in oxygen order, and [employer] will f/u with patient on arranging this for home use. Patient is aware of this.   No other needs identified by CM.   Final next level of care: Home/Self Care Barriers to Discharge: No Barriers Identified   Patient Goals and CMS Choice            Discharge Placement                       Discharge Plan and Services Additional resources added to the After Visit Summary for                  DME Arranged: Oxygen DME Agency: [employer] Date DME Agency Contacted: 11/24/24 Time DME Agency Contacted: 1201 Representative spoke with at DME Agency: [place]            Social Drivers of Health (SDOH) Interventions SDOH Screenings   Food Insecurity: No Food Insecurity (11/21/2024)  Housing: Low Risk  (11/21/2024)  Transportation Needs: No Transportation Needs (11/21/2024)  Utilities: Not At Risk (11/21/2024)  Depression (PHQ2-9): Low Risk  (09/05/2019)  Social Connections: Unknown (11/21/2024)  Tobacco Use: High Risk (11/20/2024)     Readmission Risk Interventions     No data to display

## 2024-11-25 ENCOUNTER — Other Ambulatory Visit: Payer: Self-pay

## 2024-11-25 ENCOUNTER — Emergency Department (HOSPITAL_COMMUNITY)

## 2024-11-25 ENCOUNTER — Encounter (HOSPITAL_COMMUNITY): Payer: Self-pay

## 2024-11-25 ENCOUNTER — Observation Stay (HOSPITAL_COMMUNITY)
Admission: EM | Admit: 2024-11-25 | Discharge: 2024-11-28 | DRG: 351 | Disposition: A | Attending: Surgery | Admitting: Surgery

## 2024-11-25 DIAGNOSIS — K56609 Unspecified intestinal obstruction, unspecified as to partial versus complete obstruction: Principal | ICD-10-CM

## 2024-11-25 DIAGNOSIS — K4190 Unilateral femoral hernia, without obstruction or gangrene, not specified as recurrent: Secondary | ICD-10-CM | POA: Diagnosis present

## 2024-11-25 LAB — CBC
HCT: 46.2 % — ABNORMAL HIGH (ref 36.0–46.0)
Hemoglobin: 15.6 g/dL — ABNORMAL HIGH (ref 12.0–15.0)
MCH: 32.4 pg (ref 26.0–34.0)
MCHC: 33.8 g/dL (ref 30.0–36.0)
MCV: 95.9 fL (ref 80.0–100.0)
Platelets: 365 K/uL (ref 150–400)
RBC: 4.82 MIL/uL (ref 3.87–5.11)
RDW: 12.9 % (ref 11.5–15.5)
WBC: 12 K/uL — ABNORMAL HIGH (ref 4.0–10.5)
nRBC: 0 % (ref 0.0–0.2)

## 2024-11-25 LAB — COMPREHENSIVE METABOLIC PANEL WITH GFR
ALT: 33 U/L (ref 0–44)
AST: 23 U/L (ref 15–41)
Albumin: 3.3 g/dL — ABNORMAL LOW (ref 3.5–5.0)
Alkaline Phosphatase: 65 U/L (ref 38–126)
Anion gap: 7 (ref 5–15)
BUN: 19 mg/dL (ref 8–23)
CO2: 30 mmol/L (ref 22–32)
Calcium: 8.8 mg/dL — ABNORMAL LOW (ref 8.9–10.3)
Chloride: 98 mmol/L (ref 98–111)
Creatinine, Ser: 0.73 mg/dL (ref 0.44–1.00)
GFR, Estimated: >60 mL/min (ref >60)
Glucose, Bld: 185 mg/dL — ABNORMAL HIGH (ref 70–99)
Potassium: 4 mmol/L (ref 3.5–5.1)
Sodium: 135 mmol/L (ref 135–145)
Total Bilirubin: 0.4 mg/dL (ref 0.0–1.2)
Total Protein: 5.7 g/dL — ABNORMAL LOW (ref 6.5–8.1)

## 2024-11-25 LAB — LIPASE, BLOOD: Lipase: 24 U/L (ref 11–51)

## 2024-11-25 MED ORDER — IOHEXOL 350 MG/ML SOLN
50.0000 mL | Freq: Once | INTRAVENOUS | Status: AC | PRN
  Administered 2024-11-25: 50 mL via INTRAVENOUS

## 2024-11-25 MED ORDER — HYDROMORPHONE HCL 1 MG/ML IJ SOLN
0.5000 mg | Freq: Once | INTRAMUSCULAR | Status: AC
  Administered 2024-11-25: 0.5 mg via INTRAVENOUS
  Filled 2024-11-25: qty 1

## 2024-11-25 MED ORDER — LACTATED RINGERS IV BOLUS
500.0000 mL | Freq: Once | INTRAVENOUS | Status: AC
  Administered 2024-11-25: 500 mL via INTRAVENOUS

## 2024-11-25 MED ORDER — MIDAZOLAM HCL (PF) 2 MG/2ML IJ SOLN: 1.0000 mg | Freq: Once | INTRAMUSCULAR | Status: DC

## 2024-11-25 MED ORDER — ONDANSETRON HCL 4 MG/2ML IJ SOLN
4.0000 mg | Freq: Once | INTRAMUSCULAR | Status: AC
  Administered 2024-11-25: 4 mg via INTRAVENOUS
  Filled 2024-11-25: qty 2

## 2024-11-25 NOTE — H&P (Incomplete)
 Reason for Consult/Chief Complaint: SBO 2/2 femoral hernia Consultant: Vicky, PA  Candace Middleton is an 63 y.o. female.   HPI: 28F recently admitted for PNA presents with acute onset L groin pain ~1000 on 12/2 causing her to present to the ED. Associated n/v. Known groin bulge for the last 2-61m. H/o bladder cancer, AVR. Current smoker, in the process of quitting, last cigarette use ~1w ago.   Atorva, lisinopril, singulair , inhaler, nebulizers, prednisone  50mg , doxycycline    Past Medical History:  Diagnosis Date   Allergy     Anxiety    Arthritis    Cancer (HCC)    bladder ca   COPD (chronic obstructive pulmonary disease) (HCC)    Coronary artery disease    Depression    GERD (gastroesophageal reflux disease)    History of kidney stones    Hyperlipidemia    Neuromuscular disorder (HCC) 2014   RSD - nerve blocks in back for treatment    Osteoporosis 09/05/2019    Past Surgical History:  Procedure Laterality Date   bladder cancer  2006   BLADDER TUMOR EXCISION  12/25/2004   BREAST SURGERY  long time ago   lumpectomy    CARDIAC CATHETERIZATION N/A 09/07/2015   Procedure: Left Heart Cath and Coronary Angiography;  Surgeon: Candyce GORMAN Reek, MD;  Location: [facility] INVASIVE CV LAB;  Service: Cardiovascular;  Laterality: N/A;   CESAREAN SECTION  1982   CYSTOSCOPY WITH BIOPSY N/A 09/26/2023   Procedure: CYSTOSCOPY WITH BLADDER  BIOPSY;  Surgeon: Alvaro Ricardo KATHEE Mickey., MD;  Location: [facility];  Service: Urology;  Laterality: N/A;  45 MINS FOR CASE   CYSTOSCOPY/URETEROSCOPY/HOLMIUM LASER/STENT PLACEMENT Left 09/26/2023   Procedure: CYSTOSCOPY LEFT RETROGRADE PYELOGRAM/URETEROSCOPY/BASKETING OF STONE;  Surgeon: Alvaro Ricardo KATHEE Mickey., MD;  Location: [facility];  Service: Urology;  Laterality: Left;   ENDOMETRIAL ABLATION  12/25/1993    Family History  Problem Relation Age of Onset   Heart disease Mother    Early death Father    Pulmonary embolism Father    Thyroid  disease  Sister    Melanoma Sister    Diabetes Brother    Thyroid  disease Brother    Epilepsy Son    Colon cancer Neg Hx     Social History:  reports that she has been smoking cigarettes. She has a 15 pack-year smoking history. She has never used smokeless tobacco. She reports that she does not drink alcohol  and does not use drugs.  Allergies:  Allergies  Allergen Reactions   Gabapentin  Hives    Patient reports facial swelling, hives, facial involvement    Sulfa Antibiotics Anaphylaxis    Onset at age 52, almost died because I stopped breathing.    Medications: I have reviewed the patient's current medications.  Results for orders placed or performed during the hospital encounter of 11/25/24 (from the past 48 hours)  Lipase, blood     Status: None   Collection Time: 11/25/24  2:01 PM  Result Value Ref Range   Lipase 24 11 - 51 U/L    Comment: Performed at [facility], [address], [place], [place] [address]  Comprehensive metabolic panel     Status: Abnormal   Collection Time: 11/25/24  2:01 PM  Result Value Ref Range   Sodium 135 135 - 145 mmol/L   Potassium 4.0 3.5 - 5.1 mmol/L   Chloride 98 98 - 111 mmol/L   CO2 30 22 - 32 mmol/L   Glucose, Bld 185 (H) 70 -  99 mg/dL    Comment: Glucose reference range applies only to samples taken after fasting for at least 8 hours.   BUN 19 8 - 23 mg/dL   Creatinine, Ser 9.26 0.44 - 1.00 mg/dL   Calcium  8.8 (L) 8.9 - 10.3 mg/dL   Total Protein 5.7 (L) 6.5 - 8.1 g/dL   Albumin 3.3 (L) 3.5 - 5.0 g/dL   AST 23 15 - 41 U/L   ALT 33 0 - 44 U/L   Alkaline Phosphatase 65 38 - 126 U/L   Total Bilirubin 0.4 0.0 - 1.2 mg/dL   GFR, Estimated >39 >39 mL/min    Comment: (NOTE) Calculated using the CKD-EPI Creatinine Equation (2021)    Anion gap 7 5 - 15    Comment: Performed at [facility], [address] [place], [place] [address]  CBC     Status: Abnormal   Collection Time: 11/25/24  2:01 PM  Result Value Ref Range   WBC 12.0  (H) 4.0 - 10.5 K/uL   RBC 4.82 3.87 - 5.11 MIL/uL   Hemoglobin 15.6 (H) 12.0 - 15.0 g/dL   HCT 53.7 (H) 63.9 - 53.9 %   MCV 95.9 80.0 - 100.0 fL   MCH 32.4 26.0 - 34.0 pg   MCHC 33.8 30.0 - 36.0 g/dL   RDW 87.0 88.4 - 84.4 %   Platelets 365 150 - 400 K/uL   nRBC 0.0 0.0 - 0.2 %    Comment: Performed at [facility], [address], [place], [place] [address]    CT ABDOMEN PELVIS W CONTRAST Result Date: 11/25/2024 EXAM: CT ABDOMEN AND PELVIS WITH CONTRAST 11/25/2024 10:47:12 PM TECHNIQUE: CT of the abdomen and pelvis was performed with the administration of 50 mL of iohexol  (OMNIPAQUE ) 350 MG/ML injection. Multiplanar reformatted images are provided for review. Automated exposure control, iterative reconstruction, and/or weight-based adjustment of the mA/kV was utilized to reduce the radiation dose to as low as reasonably achievable. COMPARISON: 09/17/2023 CLINICAL HISTORY: Abdominal pain, acute, nonlocalized. FINDINGS: LOWER CHEST: Emphysema in the lung bases. LIVER: The liver is unremarkable. GALLBLADDER AND BILE DUCTS: Gallbladder is unremarkable. No biliary ductal dilatation. SPLEEN: No acute abnormality. PANCREAS: No acute abnormality. ADRENAL GLANDS: No acute abnormality. KIDNEYS, URETERS AND BLADDER: No stones in the kidneys or ureters. No hydronephrosis. No perinephric or periureteral stranding. Urinary bladder is unremarkable. GI AND BOWEL: Stomach demonstrates no acute abnormality. Small bowel loop in the low pelvis measuring up to 3.1 cm in diameter with abrupt tapering at the point of herniation into a left femoral hernia (series 6 image 48). The left femoral vein is compressed by the hernia. There is no bowel obstruction. PERITONEUM AND RETROPERITONEUM: No ascites. No free air. VASCULATURE: Aorta is normal in caliber. LYMPH NODES: No lymphadenopathy. REPRODUCTIVE ORGANS: No acute abnormality. BONES AND SOFT TISSUES: No acute osseous abnormality. No focal soft tissue abnormality.  IMPRESSION: 1. Small bowel obstruction secondary to herniation into a left femoral hernia. Surgical consult recommended. 2. Incidental emphysema at the lung bases; for patients aged 65, consider evaluation for low-dose CT lung cancer screening program. Electronically signed by: Norman Gatlin MD 11/25/2024 10:59 PM EST RP Workstation: [id number]   DG Chest 2 View Result Date: 11/25/2024 EXAM: 2 VIEW(S) XRAY OF THE CHEST 11/25/2024 10:23:00 PM COMPARISON: Comparison with 11/20/2024. CLINICAL HISTORY: SOB. FINDINGS: LUNGS AND PLEURA: Hyperinflation and chronic bronchitic change. No pleural effusion. No pneumothorax. HEART AND MEDIASTINUM: No acute abnormality of the cardiac and mediastinal silhouettes. BONES AND  SOFT TISSUES: No acute osseous abnormality. IMPRESSION: 1. No acute abnormality. Emphysema. Electronically signed by: Norman Gatlin MD 11/25/2024 10:28 PM EST RP Workstation: [id number]    ROS 10 point review of systems is negative except as listed above in HPI.   Physical Exam Blood pressure 113/83, pulse 90, temperature 98.1 F (36.7 C), resp. rate (!) 22, height 4' 9 (1.448 m), weight 34 kg, SpO2 100%. Constitutional: well-developed, well-nourished HEENT: pupils equal, round, reactive to light, 2mm b/l, moist conjunctiva, external inspection of ears and nose normal, hearing intact Oropharynx: normal oropharyngeal mucosa, normal dentition Neck: no thyromegaly, trachea midline, no midline cervical tenderness to palpation Chest: breath sounds equal bilaterally, normal respiratory effort, no midline or lateral chest wall tenderness to palpation/deformity Abdomen: soft, NT, no bruising, no hepatosplenomegaly GU: normal female genitalia, L groin bulge c/w incarcerated femoral hernia - reduced in the ED  Skin: warm, dry, no rashes Psych: normal memory, normal mood/affect     Assessment/Plan: Incarcerated femoral hernia - reduced in the ED. Recent discharge from the hospital on 12/1 on  a steroid taper that she just started 12/2 at a dose of 50mg  daily. Will also send hgbA1c as BG 180s at presentation with minimal PO intake today. Will admit for observation and consideration of operative repair vs PO challenge and delayed repair after completion of steroid taper and PNA treatment. Pre-op risk stratification completed 11/19 by Dr. Michele due to h/o AVR in anticipation of screening colonoscopy by Dr. Rollin which was rescheduled due to hospital admission for PNA.  FEN - NPO except sips/chips DVT - SCDs, LMWH Dispo - med-surg, observation    Candace GEANNIE Hanger, MD General and Trauma Surgery [facility]

## 2024-11-25 NOTE — ED Triage Notes (Signed)
 Patient bib [facility] from home. Was recently discharged with pneumonia and about 4 hours ago she had sudden lower abdominal pain, nausea, and dizziness. EMS reports abdomen is slightly distended and tender to touch. Denies chest pain and shob. 4mg  of Zofran  given with EMS. She is on 2L of oxygen at home for comfort.

## 2024-11-25 NOTE — ED Provider Notes (Signed)
 [facility] Emergency Department Provider Note MRN:  [id number]  Arrival date & time: 11/25/24     Chief Complaint   Abdominal Pain, Nausea, and Dizziness   History of Present Illness   Candace Middleton is a 63 y.o. year-old female presents to the ED with chief complaint of lower abdominal pain.  Onset was earlier today.  Denies fever, chills, nausea, vomiting, diarrhea, dysuria.  Denies prior abdominal surgeries except for C-section.  She was recently admitted for CAP.  Has been taking doxy and had been released a couple of days ago.  Has been doing well with regard to her breathing and coughing.  History provided by patient. {RB interpreter (Optional):27221}  Review of Systems  Pertinent positive and negative review of systems noted in HPI.    Physical Exam   Vitals:   11/25/24 2200 11/25/24 2209  BP: (!) 147/54   Pulse: (!) 58   Resp: 15   Temp:    SpO2: 100% 100%    CONSTITUTIONAL:  non toxic-appearing, NAD NEURO:  Alert and oriented x 3, CN 3-12 grossly intact EYES:  eyes equal and reactive ENT/NECK:  Supple, no stridor  CARDIO:  normal rate, regular rhythm, appears well-perfused  PULM:  No respiratory distress, CTAB GI/GU:  non-distended, lower abdominal tenderness (diffuse) MSK/SPINE:  No gross deformities, no edema, moves all extremities  SKIN:  no rash, atraumatic   *Additional and/or pertinent findings included in MDM below  Diagnostic and Interventional Summary    EKG Interpretation Date/Time:    Ventricular Rate:    PR Interval:    QRS Duration:    QT Interval:    QTC Calculation:   R Axis:      Text Interpretation:         Labs Reviewed  COMPREHENSIVE METABOLIC PANEL WITH GFR - Abnormal; Notable for the following components:      Result Value   Glucose, Bld 185 (*)    Calcium  8.8 (*)    Total Protein 5.7 (*)    Albumin 3.3 (*)    All other components within normal limits  CBC - Abnormal; Notable for the  following components:   WBC 12.0 (*)    Hemoglobin 15.6 (*)    HCT 46.2 (*)    All other components within normal limits  LIPASE, BLOOD  URINALYSIS, ROUTINE W REFLEX MICROSCOPIC    CT ABDOMEN PELVIS W CONTRAST  Final Result    DG Chest 2 View  Final Result      Medications  midazolam  PF (VERSED ) injection 1 mg (has no administration in time range)  lactated ringers  bolus 500 mL (500 mLs Intravenous New Bag/Given 11/25/24 2259)  HYDROmorphone  (DILAUDID ) injection 0.5 mg (0.5 mg Intravenous Given 11/25/24 2238)  ondansetron  (ZOFRAN ) injection 4 mg (4 mg Intravenous Given 11/25/24 2234)  iohexol  (OMNIPAQUE ) 350 MG/ML injection 50 mL (50 mLs Intravenous Contrast Given 11/25/24 2247)     Procedures  /  Critical Care .Critical Care  Performed by: Vicky Charleston, PA-C Authorized by: Vicky Charleston, PA-C   Critical care provider statement:    Critical care time (minutes):  35   Critical care was necessary to treat or prevent imminent or life-threatening deterioration of the following conditions: SBO 2/2 hernia.   Critical care was time spent personally by me on the following activities:  Development of treatment plan with patient or surrogate, discussions with consultants, evaluation of patient's response to treatment, examination of patient, ordering and review of laboratory studies, ordering and  review of radiographic studies, ordering and performing treatments and interventions, pulse oximetry, re-evaluation of patient's condition and review of old charts   ED Course and Medical Decision Making  I have reviewed the triage vital signs, the nursing notes, and pertinent available records from the EMR.  Social Determinants Affecting Complexity of Care: Patient has no clinically significant social determinants affecting this chief complaint.. {rbsocialsolutions:27068}  ED Course:    Medical Decision Making Patient here with sudden onset abdominal pain.  Onset was earlier today.   Denies nausea or vomiting.  Denies dysuria or hematuria.  She is very tender to palpation and appears uncomfortable.  Will check CT abd/pel.  Recent admission for CAP vs COPD.  Doing much better with regard to breathing and cough.  Denies fevers.  CT worrisome for SBO 2/2 hernia.  Will consult with general surgery.    Amount and/or Complexity of Data Reviewed Labs: ordered. Radiology: ordered.  Risk Prescription drug management. Decision regarding hospitalization.      {rbcpddx (Optional):29772:::1} {rbabdddx (Optional):29773:s::1}  Consultants: I consulted with Dr. Paola, who is appreciated for consulting.   Treatment and Plan: Patient's exam and diagnostic results are concerning for SBO.  Feel that patient will need admission to the hospital for further treatment and evaluation.  {rbattending:27073}  Final Clinical Impressions(s) / ED Diagnoses     ICD-10-CM   1. SBO (small bowel obstruction) (HCC)  K56.609       ED Discharge Orders     None         Discharge Instructions Discussed with and Provided to Patient:   Discharge Instructions   None

## 2024-11-25 NOTE — ED Provider Triage Note (Signed)
 Emergency Medicine Provider Triage Evaluation Note  Candace Middleton , a 63 y.o. female  was evaluated in triage.  Pt complains of abd pain. Just got d/c from the hospital for pna, taking doxy.  Endorse mid abdominal discomfort started today, with nausea.  No fever, chills, constipation, diarrhea, or dysuria  Review of Systems  Positive: As above Negative: As above  Physical Exam  BP 137/64   Pulse (!) 59   Temp 97.8 F (36.6 C)   Resp 14   Ht 4' 9 (1.448 m)   Wt 34 kg   SpO2 99%   BMI 16.23 kg/m  Gen:   Awake, no distress   Resp:  Normal effort  MSK:   Moves extremities without difficulty  Other:    Medical Decision Making  Medically screening exam initiated at 1:51 PM.  Appropriate orders placed.  Candace Middleton was informed that the remainder of the evaluation will be completed by another provider, this initial triage assessment does not replace that evaluation, and the importance of remaining in the ED until their evaluation is complete.     Nivia Colon, PA-C 11/25/24 1352

## 2024-11-25 NOTE — Care Management Important Message (Signed)
 Important Message  Patient Details  Name: Candace Middleton MRN: [id number] Date of Birth: [birth year]   Important Message Given:  Yes - Medicare IM Patient left prior to IM delivery will mail a copy to the patient home address.     Charese Abundis 11/25/2024, 10:03 AM

## 2024-11-26 DIAGNOSIS — K4190 Unilateral femoral hernia, without obstruction or gangrene, not specified as recurrent: Secondary | ICD-10-CM | POA: Diagnosis present

## 2024-11-26 LAB — BASIC METABOLIC PANEL WITH GFR
Anion gap: 11 (ref 5–15)
BUN: 16 mg/dL (ref 8–23)
CO2: 29 mmol/L (ref 22–32)
Calcium: 9 mg/dL (ref 8.9–10.3)
Chloride: 97 mmol/L — ABNORMAL LOW (ref 98–111)
Creatinine, Ser: 0.62 mg/dL (ref 0.44–1.00)
GFR, Estimated: >60 mL/min (ref >60)
Glucose, Bld: 142 mg/dL — ABNORMAL HIGH (ref 70–99)
Potassium: 4.7 mmol/L (ref 3.5–5.1)
Sodium: 137 mmol/L (ref 135–145)

## 2024-11-26 LAB — HEMOGLOBIN A1C
Hgb A1c MFr Bld: 6.1 % — ABNORMAL HIGH (ref 4.8–5.6)
Mean Plasma Glucose: 128 mg/dL

## 2024-11-26 LAB — CBC
HCT: 44.2 % (ref 36.0–46.0)
Hemoglobin: 15 g/dL (ref 12.0–15.0)
MCH: 32.3 pg (ref 26.0–34.0)
MCHC: 33.9 g/dL (ref 30.0–36.0)
MCV: 95.3 fL (ref 80.0–100.0)
Platelets: 330 K/uL (ref 150–400)
RBC: 4.64 MIL/uL (ref 3.87–5.11)
RDW: 13 % (ref 11.5–15.5)
WBC: 9.1 K/uL (ref 4.0–10.5)
nRBC: 0 % (ref 0.0–0.2)

## 2024-11-26 LAB — BRAIN NATRIURETIC PEPTIDE: B Natriuretic Peptide: 41 pg/mL (ref 0.0–100.0)

## 2024-11-26 MED ORDER — MONTELUKAST SODIUM 10 MG PO TABS
10.0000 mg | ORAL_TABLET | Freq: Every day | ORAL | Status: DC
  Administered 2024-11-26 – 2024-11-28 (×3): 10 mg via ORAL
  Filled 2024-11-26 (×3): qty 1

## 2024-11-26 MED ORDER — METHOCARBAMOL 1000 MG/10ML IJ SOLN: 500.0000 mg | Freq: Three times a day (TID) | INTRAMUSCULAR | Status: DC | PRN

## 2024-11-26 MED ORDER — ACETAMINOPHEN 325 MG PO TABS
650.0000 mg | ORAL_TABLET | Freq: Four times a day (QID) | ORAL | Status: DC
  Administered 2024-11-26 – 2024-11-28 (×9): 650 mg via ORAL
  Filled 2024-11-26 (×10): qty 2

## 2024-11-26 MED ORDER — BENZONATATE 100 MG PO CAPS
100.0000 mg | ORAL_CAPSULE | Freq: Three times a day (TID) | ORAL | Status: DC
  Administered 2024-11-26 – 2024-11-28 (×5): 100 mg via ORAL
  Filled 2024-11-26 (×5): qty 1

## 2024-11-26 MED ORDER — METOPROLOL TARTRATE 5 MG/5ML IV SOLN: 5.0000 mg | Freq: Four times a day (QID) | INTRAVENOUS | Status: DC | PRN

## 2024-11-26 MED ORDER — METHYLPREDNISOLONE SODIUM SUCC 40 MG IJ SOLR
40.0000 mg | Freq: Every day | INTRAMUSCULAR | Status: DC
  Administered 2024-11-26 – 2024-11-28 (×3): 40 mg via INTRAVENOUS
  Filled 2024-11-26 (×3): qty 1

## 2024-11-26 MED ORDER — ENOXAPARIN SODIUM 30 MG/0.3ML IJ SOSY
30.0000 mg | PREFILLED_SYRINGE | Freq: Every day | INTRAMUSCULAR | Status: DC
  Administered 2024-11-26 – 2024-11-28 (×3): 30 mg via SUBCUTANEOUS
  Filled 2024-11-26 (×3): qty 0.3

## 2024-11-26 MED ORDER — SODIUM CHLORIDE 0.9 % IV SOLN
1.0000 g | INTRAVENOUS | Status: DC
  Administered 2024-11-26: 1 g via INTRAVENOUS
  Filled 2024-11-26: qty 10

## 2024-11-26 MED ORDER — ONDANSETRON 4 MG PO TBDP
4.0000 mg | ORAL_TABLET | Freq: Four times a day (QID) | ORAL | Status: DC | PRN
  Administered 2024-11-26: 4 mg via ORAL
  Filled 2024-11-26: qty 1

## 2024-11-26 MED ORDER — BUDESON-GLYCOPYRROL-FORMOTEROL 160-9-4.8 MCG/ACT IN AERO
2.0000 | INHALATION_SPRAY | Freq: Two times a day (BID) | RESPIRATORY_TRACT | Status: DC
  Filled 2024-11-26: qty 5.9

## 2024-11-26 MED ORDER — IPRATROPIUM-ALBUTEROL 0.5-2.5 (3) MG/3ML IN SOLN
3.0000 mL | Freq: Four times a day (QID) | RESPIRATORY_TRACT | Status: DC
  Administered 2024-11-26 – 2024-11-27 (×3): 3 mL via RESPIRATORY_TRACT
  Filled 2024-11-26 (×3): qty 3

## 2024-11-26 MED ORDER — HYDRALAZINE HCL 20 MG/ML IJ SOLN: 10.0000 mg | INTRAMUSCULAR | Status: DC | PRN

## 2024-11-26 MED ORDER — BUDESON-GLYCOPYRROL-FORMOTEROL 160-9-4.8 MCG/ACT IN AERO
2.0000 | INHALATION_SPRAY | Freq: Two times a day (BID) | RESPIRATORY_TRACT | Status: DC
  Administered 2024-11-26 – 2024-11-28 (×4): 2 via RESPIRATORY_TRACT
  Filled 2024-11-26: qty 5.9

## 2024-11-26 MED ORDER — ONDANSETRON HCL 4 MG/2ML IJ SOLN
4.0000 mg | Freq: Four times a day (QID) | INTRAMUSCULAR | Status: DC | PRN
  Administered 2024-11-26 – 2024-11-27 (×2): 4 mg via INTRAVENOUS
  Filled 2024-11-26 (×2): qty 2

## 2024-11-26 MED ORDER — METHOCARBAMOL 500 MG PO TABS: 500.0000 mg | ORAL_TABLET | Freq: Three times a day (TID) | ORAL | Status: DC | PRN

## 2024-11-26 MED ORDER — OXYCODONE HCL 5 MG PO TABS
5.0000 mg | ORAL_TABLET | ORAL | Status: DC | PRN
  Administered 2024-11-27 – 2024-11-28 (×2): 5 mg via ORAL
  Filled 2024-11-26 (×2): qty 1

## 2024-11-26 MED ORDER — PANTOPRAZOLE SODIUM 40 MG PO TBEC
40.0000 mg | DELAYED_RELEASE_TABLET | Freq: Every day | ORAL | Status: DC
  Administered 2024-11-27: 40 mg via ORAL
  Filled 2024-11-26: qty 1

## 2024-11-26 MED ORDER — POLYETHYLENE GLYCOL 3350 17 G PO PACK
17.0000 g | PACK | Freq: Every day | ORAL | Status: DC
  Administered 2024-11-26 – 2024-11-28 (×3): 17 g via ORAL
  Filled 2024-11-26 (×3): qty 1

## 2024-11-26 MED ORDER — LACTATED RINGERS IV SOLN: INTRAVENOUS | Status: AC

## 2024-11-26 NOTE — Care Management Obs Status (Signed)
 MEDICARE OBSERVATION STATUS NOTIFICATION   Patient Details  Name: Candace Middleton MRN: [id number] Date of Birth: 1961/06/04   Medicare Observation Status Notification Given:  Yes Obs letter signed and copy given    Claretta Deed 11/26/2024, 3:47 PM

## 2024-11-26 NOTE — Progress Notes (Signed)
 Rounded on patient around 1:30 PM. Her hernia has recurred, which she attributes to coughing.  I was able to reduce it again. Mild tenderness, no overlying cellulitis. No clinical evidence of bowel obstruction, tolerating some PO.  Due to recurrence, and high risk for hernia to recur again, I recommend surgical fixation. Make NPO now, sips with meds ok. TRH consult for mgmt PNA/abx/steroids and pulmonary optimization for surgery.  Tentative plan for surgical fixation tomorrow 12/4. Discussed that if she developed recurrent evidence of SBO she may need an NG tube. Discussed that if other surgical emergencies come in her surgery may get pushed back to later in the week.  Patient expressed understanding and gratitude.   Almarie Pringle, PA-C [facility] Please see Amion for pager number during day hours 7:00am-4:30pm

## 2024-11-26 NOTE — Consult Note (Signed)
 Initial Consultation Note   Patient: Candace Middleton FMW:[id number] DOB: September 30, 1961 PCP: Pcp, No DOA: 11/25/2024 DOS: the patient was seen and examined on 11/26/2024 Primary service: Kristopher Md, MD  Referring physician: Almarie Pringle, PA-C Reason for consult: Pulmonary optimization  Assessment/Plan: Assessment and Plan: 11F h/o COPD, tobacco use, HLD, HTN, and recent admission from 11/27-21/1 for AECOPD and CAP (s/p IV CTX/azithromycin ) who p/w groin pain 2/2 incarcerated femoral hernia s/p reduction x2 for which EGS plans surgical repair.  AECOPD -IV CTX 1g daily per EGS request -IV solumedrol 40mg  daily for now; can transition to PO prednisone  40mg  daily for total 5 day course iso AECOPD -Triple inhaler therapy (LAMA/LABA/ICS); consider Trelegy at discharge; consider IP Pulm consult to optimize OP meds given frequent admissions -Duonebs prn -PTA montelukast  10mg  at bedtime -Incentive spirometry and flutter valve prn -F/u BNP and diurese prn -Wean O2 as tolerated -Ambulatory pulse prior to d/c -OP Pulm f/u on d/c; consider Pulm rehab referral as well  Cough -Tessalon  Perles TID    TRH will sign off at present, please call us  again when needed.  HPI: Candace Middleton is a 63 y.o. female with past medical history of COPD, tobacco use, HLD, HTN, and recent admission from 11/27-21/1 for AECOPD and CAP (s/p IV CTX/azithromycin ) who p/w groin pain 2/2 incarcerated femoral hernia s/p reduction x2 for which EGS plans surgical repair.  Review of Systems: As mentioned in the history of present illness. All other systems reviewed and are negative. Past Medical History:  Diagnosis Date   Allergy     Anxiety    Arthritis    Cancer (HCC)    bladder ca   COPD (chronic obstructive pulmonary disease) (HCC)    Coronary artery disease    Depression    GERD (gastroesophageal reflux disease)    History of kidney stones    Hyperlipidemia    Neuromuscular disorder (HCC) 2014   RSD -  nerve blocks in back for treatment    Osteoporosis 09/05/2019   Past Surgical History:  Procedure Laterality Date   bladder cancer  2006   BLADDER TUMOR EXCISION  12/25/2004   BREAST SURGERY  long time ago   lumpectomy    CARDIAC CATHETERIZATION N/A 09/07/2015   Procedure: Left Heart Cath and Coronary Angiography;  Surgeon: Candyce GORMAN Reek, MD;  Location: [facility] INVASIVE CV LAB;  Service: Cardiovascular;  Laterality: N/A;   CESAREAN SECTION  1982   CYSTOSCOPY WITH BIOPSY N/A 09/26/2023   Procedure: CYSTOSCOPY WITH BLADDER  BIOPSY;  Surgeon: Alvaro Ricardo KATHEE Mickey., MD;  Location: [facility];  Service: Urology;  Laterality: N/A;  45 MINS FOR CASE   CYSTOSCOPY/URETEROSCOPY/HOLMIUM LASER/STENT PLACEMENT Left 09/26/2023   Procedure: CYSTOSCOPY LEFT RETROGRADE PYELOGRAM/URETEROSCOPY/BASKETING OF STONE;  Surgeon: Alvaro Ricardo KATHEE Mickey., MD;  Location: [facility];  Service: Urology;  Laterality: Left;   ENDOMETRIAL ABLATION  12/25/1993   Social History:  reports that she has been smoking cigarettes. She has a 15 pack-year smoking history. She has never used smokeless tobacco. She reports that she does not drink alcohol  and does not use drugs.  Allergies  Allergen Reactions   Gabapentin  Hives    Patient reports facial swelling, hives, facial involvement    Sulfa Antibiotics Anaphylaxis    Onset at age 73, almost died because I stopped breathing.    Family History  Problem Relation Age of Onset   Heart disease Mother    Early death Father    Pulmonary embolism Father    Thyroid   disease Sister    Melanoma Sister    Diabetes Brother    Thyroid  disease Brother    Epilepsy Son    Colon cancer Neg Hx     Prior to Admission medications   Medication Sig Start Date End Date Taking? Authorizing Provider  albuterol  (PROVENTIL ) (2.5 MG/3ML) 0.083% nebulizer solution Take 3 mLs (2.5 mg total) by nebulization every 6 (six) hours as needed for wheezing or shortness of breath. 09/08/24   Hope Almarie ORN,  NP  albuterol  (VENTOLIN  HFA) 108 (90 Base) MCG/ACT inhaler Inhale 2 puffs into the lungs every 6 (six) hours as needed.    [provider]  amLODipine  (NORVASC ) 2.5 MG tablet Take 1 tablet (2.5 mg total) by mouth at bedtime. 09/30/24   Parthenia Olivia HERO, PA-C  aspirin  EC 81 MG tablet Take 1 tablet (81 mg total) by mouth daily. 08/13/15   Marcus Annabella RAMAN, PA-C  atorvastatin  (LIPITOR) 20 MG tablet TAKE 1 TABLET BY MOUTH EVERY DAY Patient taking differently: Take 20 mg by mouth at bedtime. 06/22/20   Cresenzo, John V, MD  bictegravir-emtricitabine-tenofovir AF (BIKTARVY ) 50-200-25 MG TABS Prepack Take 1 tablet by mouth daily.    [provider]  budeson-glycopyrrolate -formoterol  (BREZTRI ) 160-9-4.8 MCG/ACT AERO inhaler Inhale 2 puffs into the lungs 2 (two) times daily.    [provider]  doxycycline  (VIBRA -TABS) 100 MG tablet Take 1 tablet (100 mg total) by mouth 2 (two) times daily. 11/24/24   Singh, Prashant K, MD  Ensifentrine  (OHTUVAYRE ) 3 MG/2.5ML SUSP Inhale 3 mg into the lungs in the morning and at bedtime. 09/12/24   Hope Almarie ORN, NP  montelukast  (SINGULAIR ) 10 MG tablet Take 10 mg by mouth daily.    [provider]  oxyCODONE -acetaminophen  (PERCOCET/ROXICET) 5-325 MG tablet Take 1 tablet by mouth every 6 (six) hours as needed for moderate pain (pain score 4-6). 09/10/24   [provider]  pantoprazole (PROTONIX) 40 MG tablet Take 1 tablet (40 mg total) by mouth daily at 12 noon. 11/24/24   Singh, Prashant K, MD  predniSONE  (DELTASONE ) 5 MG tablet Take 10 tabs by mouth for 3 days then, 8 tabs by mouth for 3 days, 6 tabs by mouth for 3 days, 4 tabs by mouth for 3 days, 2 tabs by mouth for 3 days, 1 tab by mouth for 3 days, 1/2 tab by mouth for 3 days then STOP. Total 95 tablets. 11/24/24   Dennise Lavada POUR, MD    Physical Exam: Vitals:   11/26/24 0532 11/26/24 0700 11/26/24 0730 11/26/24 0934  BP:  138/67 126/69 130/68  Pulse:  72 80 80  Resp:  18  18 18   Temp: 97.9 F (36.6 C)   98 F (36.7 C)  TempSrc: Oral   Oral  SpO2:  100% 100% 98%  Weight:      Height:       General: Alert, oriented x3, resting comfortably in no acute distress HEENT: EOMI, oropharynx clear, moist mucous membranes, hearing intact Neck: Trachea midline and no gross thyromegaly Respiratory: Lungs clear to auscultation bilaterally with normal respiratory effort; no w/r/r Cardiovascular: Regular rate and rhythm w/o m/r/g Abdomen: Soft, nontender, nondistended. Positive bowel sounds MSK: No obvious joint deformities or swelling Skin: No obvious rashes or lesions Neurologic: Awake, alert, spontaneously moves all extremities, strength intact Psychiatric: Appropriate mood and affect, conversational and cooperative   Data Reviewed:   Lab Results  Component Value Date   WBC 9.1 11/26/2024   HGB 15.0 11/26/2024  HCT 44.2 11/26/2024   MCV 95.3 11/26/2024   PLT 330 11/26/2024   Lab Results  Component Value Date   GLUCOSE 142 (H) 11/26/2024   CALCIUM  9.0 11/26/2024   NA 137 11/26/2024   K 4.7 11/26/2024   CO2 29 11/26/2024   CL 97 (L) 11/26/2024   BUN 16 11/26/2024   CREATININE 0.62 11/26/2024   Lab Results  Component Value Date   ALT 33 11/25/2024   AST 23 11/25/2024   ALKPHOS 65 11/25/2024   BILITOT 0.4 11/25/2024   Lab Results  Component Value Date   INR 0.91 09/01/2015   Radiology: CT ABDOMEN PELVIS W CONTRAST Result Date: 11/25/2024 EXAM: CT ABDOMEN AND PELVIS WITH CONTRAST 11/25/2024 10:47:12 PM TECHNIQUE: CT of the abdomen and pelvis was performed with the administration of 50 mL of iohexol  (OMNIPAQUE ) 350 MG/ML injection. Multiplanar reformatted images are provided for review. Automated exposure control, iterative reconstruction, and/or weight-based adjustment of the mA/kV was utilized to reduce the radiation dose to as low as reasonably achievable. COMPARISON: 09/17/2023 CLINICAL HISTORY: Abdominal pain, acute, nonlocalized. FINDINGS:  LOWER CHEST: Emphysema in the lung bases. LIVER: The liver is unremarkable. GALLBLADDER AND BILE DUCTS: Gallbladder is unremarkable. No biliary ductal dilatation. SPLEEN: No acute abnormality. PANCREAS: No acute abnormality. ADRENAL GLANDS: No acute abnormality. KIDNEYS, URETERS AND BLADDER: No stones in the kidneys or ureters. No hydronephrosis. No perinephric or periureteral stranding. Urinary bladder is unremarkable. GI AND BOWEL: Stomach demonstrates no acute abnormality. Small bowel loop in the low pelvis measuring up to 3.1 cm in diameter with abrupt tapering at the point of herniation into a left femoral hernia (series 6 image 48). The left femoral vein is compressed by the hernia. There is no bowel obstruction. PERITONEUM AND RETROPERITONEUM: No ascites. No free air. VASCULATURE: Aorta is normal in caliber. LYMPH NODES: No lymphadenopathy. REPRODUCTIVE ORGANS: No acute abnormality. BONES AND SOFT TISSUES: No acute osseous abnormality. No focal soft tissue abnormality. IMPRESSION: 1. Small bowel obstruction secondary to herniation into a left femoral hernia. Surgical consult recommended. 2. Incidental emphysema at the lung bases; for patients aged 65, consider evaluation for low-dose CT lung cancer screening program. Electronically signed by: Norman Gatlin MD 11/25/2024 10:59 PM EST RP Workstation: [id number]   DG Chest 2 View Result Date: 11/25/2024 EXAM: 2 VIEW(S) XRAY OF THE CHEST 11/25/2024 10:23:00 PM COMPARISON: Comparison with 11/20/2024. CLINICAL HISTORY: SOB. FINDINGS: LUNGS AND PLEURA: Hyperinflation and chronic bronchitic change. No pleural effusion. No pneumothorax. HEART AND MEDIASTINUM: No acute abnormality of the cardiac and mediastinal silhouettes. BONES AND SOFT TISSUES: No acute osseous abnormality. IMPRESSION: 1. No acute abnormality. Emphysema. Electronically signed by: Norman Gatlin MD 11/25/2024 10:28 PM EST RP Workstation: [id number]      Family Communication: N/A Primary  team communication: Per secure chat in Epic   Thank you very much for involving us  in the care of your patient.   ------- I spent 50 minutes reviewing previous notes, at the bedside counseling/discussing the treatment plan, and performing clinical documentation.  Author: Marsha Ada, MD 11/26/2024 2:34 PM  For on call review [email or web address].

## 2024-11-26 NOTE — TOC CM/SW Note (Signed)
 Transition of Care [facility]) - Inpatient Brief Assessment   Patient Details  Name: MACIEL KEGG MRN: [id number] Date of Birth: [birth year]  Transition of Care [facility]) CM/SW Contact:    Roxie KANDICE Stain, RN Phone Number: 11/26/2024, 4:12 PM   Clinical Narrative:  Spoke to patient regarding transition needs.  Patient states she will reschedule her PCP apt with [facility] due to hospitalization.  Patient has home 02 from [place] and is asking about POC. Spoke to Jermaine with [employer] who will check on the POC and will notify this RNCM.   Transition of Care Asessment: Insurance and Status: Insurance coverage has been reviewed Patient has primary care physician: Yes Home environment has been reviewed: safe to discharge home Prior level of function:: independent Prior/Current Home Services: No current home services Social Drivers of Health Review: SDOH reviewed no interventions necessary Readmission risk has been reviewed: Yes Transition of care needs: no transition of care needs at this time

## 2024-11-26 NOTE — H&P (View-Only) (Signed)
 Rounded on patient around 1:30 PM. Her hernia has recurred, which she attributes to coughing.  I was able to reduce it again. Mild tenderness, no overlying cellulitis. No clinical evidence of bowel obstruction, tolerating some PO.  Due to recurrence, and high risk for hernia to recur again, I recommend surgical fixation. Make NPO now, sips with meds ok. TRH consult for mgmt PNA/abx/steroids and pulmonary optimization for surgery.  Tentative plan for surgical fixation tomorrow 12/4. Discussed that if she developed recurrent evidence of SBO she may need an NG tube. Discussed that if other surgical emergencies come in her surgery may get pushed back to later in the week.  Patient expressed understanding and gratitude.   Almarie Pringle, PA-C [facility] Please see Amion for pager number during day hours 7:00am-4:30pm

## 2024-11-26 NOTE — Plan of Care (Signed)

## 2024-11-26 NOTE — H&P (Incomplete)
 Reason for Consult/Chief Complaint: *** Consultant: ***, {Blank single:19197::DO,PA,MD}  Candace Middleton is an 63 y.o. female.   HPI: 33F recently admitted for PNA   Atorva, lisinopril, singulair , inhaler, nebulizer,   Past Medical History:  Diagnosis Date  . Allergy    . Anxiety   . Arthritis   . Cancer (HCC)    bladder ca  . COPD (chronic obstructive pulmonary disease) (HCC)   . Coronary artery disease   . Depression   . GERD (gastroesophageal reflux disease)   . History of kidney stones   . Hyperlipidemia   . Neuromuscular disorder (HCC) 2014   RSD - nerve blocks in back for treatment   . Osteoporosis 09/05/2019    Past Surgical History:  Procedure Laterality Date  . bladder cancer  2006  . BLADDER TUMOR EXCISION  12/25/2004  . BREAST SURGERY  long time ago   lumpectomy   . CARDIAC CATHETERIZATION N/A 09/07/2015   Procedure: Left Heart Cath and Coronary Angiography;  Surgeon: Candyce GORMAN Reek, MD;  Location: [facility] INVASIVE CV LAB;  Service: Cardiovascular;  Laterality: N/A;  . CESAREAN SECTION  1982  . CYSTOSCOPY WITH BIOPSY N/A 09/26/2023   Procedure: CYSTOSCOPY WITH BLADDER  BIOPSY;  Surgeon: Alvaro Ricardo KATHEE Mickey., MD;  Location: [facility];  Service: Urology;  Laterality: N/A;  45 MINS FOR CASE  . CYSTOSCOPY/URETEROSCOPY/HOLMIUM LASER/STENT PLACEMENT Left 09/26/2023   Procedure: CYSTOSCOPY LEFT RETROGRADE PYELOGRAM/URETEROSCOPY/BASKETING OF STONE;  Surgeon: Alvaro Ricardo KATHEE Mickey., MD;  Location: [facility];  Service: Urology;  Laterality: Left;  . ENDOMETRIAL ABLATION  12/25/1993    Family History  Problem Relation Age of Onset  . Heart disease Mother   . Early death Father   . Pulmonary embolism Father   . Thyroid  disease Sister   . Melanoma Sister   . Diabetes Brother   . Thyroid  disease Brother   . Epilepsy Son   . Colon cancer Neg Hx     Social History:  reports that she has been smoking cigarettes. She has a 15 pack-year smoking history. She  has never used smokeless tobacco. She reports that she does not drink alcohol  and does not use drugs.  Allergies:  Allergies  Allergen Reactions  . Gabapentin  Hives    Patient reports facial swelling, hives, facial involvement   . Sulfa Antibiotics Anaphylaxis    Onset at age 72, almost died because I stopped breathing.    Medications: I have reviewed the patient's current medications.  Results for orders placed or performed during the hospital encounter of 11/25/24 (from the past 48 hours)  Lipase, blood     Status: None   Collection Time: 11/25/24  2:01 PM  Result Value Ref Range   Lipase 24 11 - 51 U/L    Comment: Performed at [facility], [address], [place], [place] [address]  Comprehensive metabolic panel     Status: Abnormal   Collection Time: 11/25/24  2:01 PM  Result Value Ref Range   Sodium 135 135 - 145 mmol/L   Potassium 4.0 3.5 - 5.1 mmol/L   Chloride 98 98 - 111 mmol/L   CO2 30 22 - 32 mmol/L   Glucose, Bld 185 (H) 70 - 99 mg/dL    Comment: Glucose reference range applies only to samples taken after fasting for at least 8 hours.   BUN 19 8 - 23 mg/dL   Creatinine, Ser 9.26 0.44 - 1.00 mg/dL   Calcium  8.8 (L) 8.9 - 10.3  mg/dL   Total Protein 5.7 (L) 6.5 - 8.1 g/dL   Albumin 3.3 (L) 3.5 - 5.0 g/dL   AST 23 15 - 41 U/L   ALT 33 0 - 44 U/L   Alkaline Phosphatase 65 38 - 126 U/L   Total Bilirubin 0.4 0.0 - 1.2 mg/dL   GFR, Estimated >39 >39 mL/min    Comment: (NOTE) Calculated using the CKD-EPI Creatinine Equation (2021)    Anion gap 7 5 - 15    Comment: Performed at [facility], [address], [place], [place] [address]  CBC     Status: Abnormal   Collection Time: 11/25/24  2:01 PM  Result Value Ref Range   WBC 12.0 (H) 4.0 - 10.5 K/uL   RBC 4.82 3.87 - 5.11 MIL/uL   Hemoglobin 15.6 (H) 12.0 - 15.0 g/dL   HCT 53.7 (H) 63.9 - 53.9 %   MCV 95.9 80.0 - 100.0 fL   MCH 32.4 26.0 - 34.0 pg   MCHC 33.8 30.0 - 36.0 g/dL   RDW 87.0 88.4 - 84.4  %   Platelets 365 150 - 400 K/uL   nRBC 0.0 0.0 - 0.2 %    Comment: Performed at [facility], [address], [place], [place] [address]    CT ABDOMEN PELVIS W CONTRAST Result Date: 11/25/2024 EXAM: CT ABDOMEN AND PELVIS WITH CONTRAST 11/25/2024 10:47:12 PM TECHNIQUE: CT of the abdomen and pelvis was performed with the administration of 50 mL of iohexol  (OMNIPAQUE ) 350 MG/ML injection. Multiplanar reformatted images are provided for review. Automated exposure control, iterative reconstruction, and/or weight-based adjustment of the mA/kV was utilized to reduce the radiation dose to as low as reasonably achievable. COMPARISON: 09/17/2023 CLINICAL HISTORY: Abdominal pain, acute, nonlocalized. FINDINGS: LOWER CHEST: Emphysema in the lung bases. LIVER: The liver is unremarkable. GALLBLADDER AND BILE DUCTS: Gallbladder is unremarkable. No biliary ductal dilatation. SPLEEN: No acute abnormality. PANCREAS: No acute abnormality. ADRENAL GLANDS: No acute abnormality. KIDNEYS, URETERS AND BLADDER: No stones in the kidneys or ureters. No hydronephrosis. No perinephric or periureteral stranding. Urinary bladder is unremarkable. GI AND BOWEL: Stomach demonstrates no acute abnormality. Small bowel loop in the low pelvis measuring up to 3.1 cm in diameter with abrupt tapering at the point of herniation into a left femoral hernia (series 6 image 48). The left femoral vein is compressed by the hernia. There is no bowel obstruction. PERITONEUM AND RETROPERITONEUM: No ascites. No free air. VASCULATURE: Aorta is normal in caliber. LYMPH NODES: No lymphadenopathy. REPRODUCTIVE ORGANS: No acute abnormality. BONES AND SOFT TISSUES: No acute osseous abnormality. No focal soft tissue abnormality. IMPRESSION: 1. Small bowel obstruction secondary to herniation into a left femoral hernia. Surgical consult recommended. 2. Incidental emphysema at the lung bases; for patients aged 65, consider evaluation for low-dose CT lung  cancer screening program. Electronically signed by: Norman Gatlin MD 11/25/2024 10:59 PM EST RP Workstation: [id number]   DG Chest 2 View Result Date: 11/25/2024 EXAM: 2 VIEW(S) XRAY OF THE CHEST 11/25/2024 10:23:00 PM COMPARISON: Comparison with 11/20/2024. CLINICAL HISTORY: SOB. FINDINGS: LUNGS AND PLEURA: Hyperinflation and chronic bronchitic change. No pleural effusion. No pneumothorax. HEART AND MEDIASTINUM: No acute abnormality of the cardiac and mediastinal silhouettes. BONES AND SOFT TISSUES: No acute osseous abnormality. IMPRESSION: 1. No acute abnormality. Emphysema. Electronically signed by: Norman Gatlin MD 11/25/2024 10:28 PM EST RP Workstation: [id number]    ROS 10 point review of systems is negative except as listed above in HPI.   Physical Exam  Blood pressure 113/83, pulse 90, temperature 98.1 F (36.7 C), resp. rate (!) 22, height 4' 9 (1.448 m), weight 34 kg, SpO2 100%. Constitutional: well-developed, well-nourished*** HEENT: pupils equal, round, reactive to light, 2***mm b/l, moist conjunctiva, external inspection of ears and nose normal, hearing {Blank single:19197::intact,diminished,unable to be assessed} Oropharynx: normal oropharyngeal mucosa, {Blank single:19197::normal,poor} dentition Neck: no thyromegaly, trachea midline, {Blank single:19197::+,no,unable to assess} midline cervical tenderness to palpation Chest: breath sounds equal bilaterally, {Blank single:19197::normal,absent,labored} respiratory effort, {Blank single:19197::+,no} midline or lateral chest wall tenderness to palpation/deformity Abdomen: soft, NT, no bruising, no hepatosplenomegaly GU: {Blank single:19197::no blood at urethral meatus of penis, no scrotal masses or abnormality,normal female genitalia}  Back: no wounds, {Blank single:19197::+,no,unable to assess} thoracic/lumbar spine tenderness to palpation, {Blank single:19197::+,no} thoracic/lumbar spine  stepoffs Rectal: {Blank single:19197::good tone, no blood,deferred} Extremities: 2+ radial and pedal pulses bilaterally, {Blank single:19197::intact,unable to assess} motor and sensation bilateral UE and LE, {Blank single:19197::+,no} peripheral edema MSK: {Blank single:19197::normal,abnormal,unable to assess} gait/station, no clubbing/cyanosis of fingers/toes, {Blank single:19197::normal,limited,unable to assess} ROM of all four extremities Skin: warm, dry, no rashes Psych: {Blank single:19197::normal memory, normal mood/affect,unable to assess}     Assessment/Plan: ***  *** -  FEN - {diet:29922} DVT - SCDs, {Blank single:19197::LMWH,LMWH 40BID,SQH,hold chemical ppx due to bleeding concerns} Dispo - {Blank single:19197::ICU,4NP,med-surg,***}    Dreama GEANNIE Hanger, MD General and Trauma Surgery [facility]

## 2024-11-27 ENCOUNTER — Inpatient Hospital Stay (HOSPITAL_COMMUNITY): Admitting: Anesthesiology

## 2024-11-27 ENCOUNTER — Encounter (HOSPITAL_COMMUNITY): Admission: EM | Disposition: A | Payer: Self-pay | Source: Home / Self Care

## 2024-11-27 HISTORY — PX: INGUINAL HERNIA REPAIR: SHX194

## 2024-11-27 LAB — SURGICAL PCR SCREEN
MRSA, PCR: NEGATIVE
Staphylococcus aureus: NEGATIVE

## 2024-11-27 SURGERY — REPAIR, HERNIA, INGUINAL, ADULT
Anesthesia: General | Laterality: Left

## 2024-11-27 MED ORDER — FENTANYL CITRATE (PF) 100 MCG/2ML IJ SOLN
INTRAMUSCULAR | Status: AC
  Filled 2024-11-27: qty 2

## 2024-11-27 MED ORDER — BUPIVACAINE-EPINEPHRINE (PF) 0.25% -1:200000 IJ SOLN
INTRAMUSCULAR | Status: AC
  Filled 2024-11-27: qty 30

## 2024-11-27 MED ORDER — PROPOFOL 10 MG/ML IV BOLUS
INTRAVENOUS | Status: DC | PRN
  Administered 2024-11-27: 100 mg via INTRAVENOUS

## 2024-11-27 MED ORDER — MUPIROCIN 2 % EX OINT
1.0000 | TOPICAL_OINTMENT | Freq: Two times a day (BID) | CUTANEOUS | Status: DC
  Administered 2024-11-27 – 2024-11-28 (×3): 1 via NASAL
  Filled 2024-11-27 (×2): qty 22

## 2024-11-27 MED ORDER — ORAL CARE MOUTH RINSE: 15.0000 mL | Freq: Once | OROMUCOSAL | Status: AC

## 2024-11-27 MED ORDER — LACTATED RINGERS IV SOLN: INTRAVENOUS | Status: DC

## 2024-11-27 MED ORDER — ONDANSETRON HCL 4 MG/2ML IJ SOLN
INTRAMUSCULAR | Status: DC | PRN
  Administered 2024-11-27: 4 mg via INTRAVENOUS

## 2024-11-27 MED ORDER — PHENYLEPHRINE HCL-NACL 20-0.9 MG/250ML-% IV SOLN
INTRAVENOUS | Status: DC | PRN
  Administered 2024-11-27: 15 ug/min via INTRAVENOUS

## 2024-11-27 MED ORDER — IPRATROPIUM-ALBUTEROL 0.5-2.5 (3) MG/3ML IN SOLN
3.0000 mL | Freq: Two times a day (BID) | RESPIRATORY_TRACT | Status: DC
  Administered 2024-11-27: 3 mL via RESPIRATORY_TRACT
  Filled 2024-11-27: qty 3

## 2024-11-27 MED ORDER — 0.9 % SODIUM CHLORIDE (POUR BTL) OPTIME
TOPICAL | Status: DC | PRN
  Administered 2024-11-27: 1000 mL

## 2024-11-27 MED ORDER — CEFAZOLIN SODIUM-DEXTROSE 2-3 GM-%(50ML) IV SOLR
INTRAVENOUS | Status: DC | PRN
  Administered 2024-11-27: 2 g via INTRAVENOUS

## 2024-11-27 MED ORDER — BUPIVACAINE-EPINEPHRINE 0.25% -1:200000 IJ SOLN
INTRAMUSCULAR | Status: DC | PRN
  Administered 2024-11-27: 28 mL

## 2024-11-27 MED ORDER — CHLORHEXIDINE GLUCONATE 0.12 % MT SOLN: 15.0000 mL | Freq: Once | OROMUCOSAL | Status: AC

## 2024-11-27 MED ORDER — PHENYLEPHRINE 80 MCG/ML (10ML) SYRINGE FOR IV PUSH (FOR BLOOD PRESSURE SUPPORT)
PREFILLED_SYRINGE | INTRAVENOUS | Status: AC
  Filled 2024-11-27: qty 10

## 2024-11-27 MED ORDER — SUGAMMADEX SODIUM 200 MG/2ML IV SOLN
INTRAVENOUS | Status: DC | PRN
  Administered 2024-11-27: 100 mg via INTRAVENOUS

## 2024-11-27 MED ORDER — CHLORHEXIDINE GLUCONATE 0.12 % MT SOLN
OROMUCOSAL | Status: AC
  Administered 2024-11-27: 15 mL via OROMUCOSAL
  Filled 2024-11-27: qty 15

## 2024-11-27 MED ORDER — FENTANYL CITRATE (PF) 250 MCG/5ML IJ SOLN
INTRAMUSCULAR | Status: DC | PRN
  Administered 2024-11-27 (×2): 50 ug via INTRAVENOUS

## 2024-11-27 MED ORDER — EPHEDRINE 5 MG/ML INJ
INTRAVENOUS | Status: AC
  Filled 2024-11-27: qty 5

## 2024-11-27 MED ORDER — HYDROMORPHONE HCL 1 MG/ML IJ SOLN
1.0000 mg | INTRAMUSCULAR | Status: DC | PRN
  Administered 2024-11-28: 1 mg via INTRAVENOUS
  Filled 2024-11-27: qty 1

## 2024-11-27 MED ORDER — LIDOCAINE 2% (20 MG/ML) 5 ML SYRINGE
INTRAMUSCULAR | Status: AC
  Filled 2024-11-27: qty 5

## 2024-11-27 MED ORDER — HYDROMORPHONE HCL 1 MG/ML IJ SOLN: 0.2500 mg | INTRAMUSCULAR | Status: DC | PRN

## 2024-11-27 MED ORDER — DEXAMETHASONE SOD PHOSPHATE PF 10 MG/ML IJ SOLN
INTRAMUSCULAR | Status: DC | PRN
  Administered 2024-11-27: 5 mg via INTRAVENOUS

## 2024-11-27 MED ORDER — PHENYLEPHRINE 80 MCG/ML (10ML) SYRINGE FOR IV PUSH (FOR BLOOD PRESSURE SUPPORT)
PREFILLED_SYRINGE | INTRAVENOUS | Status: DC | PRN
  Administered 2024-11-27 (×3): 160 ug via INTRAVENOUS

## 2024-11-27 MED ORDER — ROCURONIUM BROMIDE 10 MG/ML (PF) SYRINGE
PREFILLED_SYRINGE | INTRAVENOUS | Status: DC | PRN
  Administered 2024-11-27: 40 mg via INTRAVENOUS
  Administered 2024-11-27: 30 mg via INTRAVENOUS

## 2024-11-27 MED ORDER — ONDANSETRON HCL 4 MG/2ML IJ SOLN
INTRAMUSCULAR | Status: AC
  Filled 2024-11-27: qty 2

## 2024-11-27 MED ORDER — LIDOCAINE 2% (20 MG/ML) 5 ML SYRINGE
INTRAMUSCULAR | Status: DC | PRN
  Administered 2024-11-27: 60 mg via INTRAVENOUS

## 2024-11-27 MED ORDER — PROPOFOL 10 MG/ML IV BOLUS
INTRAVENOUS | Status: AC
  Filled 2024-11-27: qty 20

## 2024-11-27 MED ORDER — ROCURONIUM BROMIDE 10 MG/ML (PF) SYRINGE
PREFILLED_SYRINGE | INTRAVENOUS | Status: AC
  Filled 2024-11-27: qty 10

## 2024-11-27 MED ORDER — CEFAZOLIN SODIUM-DEXTROSE 2-4 GM/100ML-% IV SOLN
INTRAVENOUS | Status: AC
  Filled 2024-11-27: qty 100

## 2024-11-27 SURGICAL SUPPLY — 34 items
BAG COUNTER SPONGE SURGICOUNT (BAG) ×1 IMPLANT
BLADE CLIPPER SURG (BLADE) IMPLANT
CANISTER SUCTION 3000ML PPV (SUCTIONS) IMPLANT
CHLORAPREP W/TINT 26 (MISCELLANEOUS) ×1 IMPLANT
CLIP APPLIE 11 MED OPEN (CLIP) IMPLANT
COVER SURGICAL LIGHT HANDLE (MISCELLANEOUS) ×1 IMPLANT
DERMABOND ADVANCED .7 DNX12 (GAUZE/BANDAGES/DRESSINGS) ×1 IMPLANT
DRAIN PENROSE .5X12 LATEX STL (DRAIN) IMPLANT
DRAPE LAPAROTOMY TRNSV 102X78 (DRAPES) ×1 IMPLANT
ELECTRODE REM PT RTRN 9FT ADLT (ELECTROSURGICAL) ×1 IMPLANT
GLOVE BIO SURGEON STRL SZ8 (GLOVE) ×1 IMPLANT
GLOVE BIOGEL PI IND STRL 8 (GLOVE) ×1 IMPLANT
GOWN STRL REUS W/ TWL LRG LVL3 (GOWN DISPOSABLE) ×1 IMPLANT
GOWN STRL REUS W/ TWL XL LVL3 (GOWN DISPOSABLE) ×1 IMPLANT
KIT BASIN OR (CUSTOM PROCEDURE TRAY) ×1 IMPLANT
KIT TURNOVER KIT B (KITS) ×1 IMPLANT
MESH OVITEX 1S RESORB 10X12 6L (Mesh General) IMPLANT
NDL HYPO 25GX1X1/2 BEV (NEEDLE) ×1 IMPLANT
NEEDLE HYPO 25GX1X1/2 BEV (NEEDLE) ×1 IMPLANT
PACK GENERAL/GYN (CUSTOM PROCEDURE TRAY) ×1 IMPLANT
PAD ARMBOARD POSITIONER FOAM (MISCELLANEOUS) ×1 IMPLANT
PENCIL SMOKE EVACUATOR (MISCELLANEOUS) ×1 IMPLANT
SOLN 0.9% NACL POUR BTL 1000ML (IV SOLUTION) ×1 IMPLANT
SUT MNCRL AB 4-0 PS2 18 (SUTURE) ×1 IMPLANT
SUT NOVA NAB DX-16 0-1 5-0 T12 (SUTURE) ×1 IMPLANT
SUT NOVA NAB GS-21 0 18 T12 DT (SUTURE) IMPLANT
SUT SILK 2 0 SH (SUTURE) IMPLANT
SUT VIC AB 0 CT1 27XBRD ANBCTR (SUTURE) IMPLANT
SUT VIC AB 2-0 SH 27X BRD (SUTURE) ×1 IMPLANT
SUT VIC AB 3-0 SH 18 (SUTURE) ×1 IMPLANT
SUT VICRYL AB 3 0 TIES (SUTURE) ×1 IMPLANT
SYR CONTROL 10ML LL (SYRINGE) ×1 IMPLANT
TOWEL GREEN STERILE (TOWEL DISPOSABLE) ×1 IMPLANT
TOWEL GREEN STERILE FF (TOWEL DISPOSABLE) ×1 IMPLANT

## 2024-11-27 NOTE — Progress Notes (Signed)
  Progress Note   Patient: Candace Middleton FMW:[id number] DOB: 01-Nov-1961 DOA: 11/25/2024     0 DOS: the patient was seen and examined on 11/27/2024   Brief hospital course: GENNAVIEVE HUQ is a 63 y.o. female with past medical history of COPD, tobacco use, HLD, HTN, and recent admission from 11/27-21/1 for AECOPD and CAP (s/p IV CTX/azithromycin ) who p/w groin pain 2/2 incarcerated femoral hernia s/p reduction x2 for which EGS plans surgical repair later today.   The patient does not have a history of O2 requirements at home. She was recently discharged from this facility on 12/05/2024 after a stay for COPD exacerbation with acute hypoxic respiratory failure. She was discharged on a steroid taper and home O2 for activities. She is currently requiring 2L in order to maintain saturations of 96%. CXR upon arrival demonstrated only severe emphysema. WBC was 12.0. This was likely due to recurrent incarcerated hernia.   She is medically optimized for surgery today.  Assessment and Plan: 1F h/o COPD, tobacco use, HLD, HTN, and recent admission from 11/27-21/1 for AECOPD and CAP (s/p IV CTX/azithromycin ) who p/w groin pain 2/2 incarcerated femoral hernia s/p reduction x2 for which EGS plans surgical repair.   AECOPD -IV CTX 1g daily per EGS request -IV solumedrol 40mg  daily for now; can transition to PO prednisone  40mg  daily for total 5 day course iso AECOPD -Triple inhaler therapy (LAMA/LABA/ICS); consider Trelegy at discharge; consider IP Pulm consult to optimize OP meds given frequent admissions -Duonebs prn -PTA montelukast  10mg  at bedtime -Incentive spirometry and flutter valve prn -F/u BNP and diurese prn -Wean O2 as tolerated -Ambulatory pulse prior to d/c -OP Pulm f/u on d/c; consider Pulm rehab referral as well   Cough -Tessalon  Perles TID      Subjective: The patient is sitting up at bedside talking with a visitor. No complaints.  Physical Exam: Vitals:   11/27/24 0449  11/27/24 0746 11/27/24 0749 11/27/24 1423  BP: 126/68 109/65  123/66  Pulse: 66 66 69 75  Resp: 18 18 16 17   Temp: 98.6 F (37 C) 98.4 F (36.9 C)  98.7 F (37.1 C)  TempSrc: Oral Oral    SpO2: 99% 96% 96% 96%  Weight:      Height:       Exam:  Constitutional:  The patient is awake, alert, and oriented x 3. No acute distress. Respiratory:  No increased work of breathing. No wheezes, rales, or rhonchi Diminished breath sounds throughout. No tactile fremitus Cardiovascular:  Regular rate and rhythm No murmurs, ectopy, or gallups. No lateral PMI. No thrills. Abdomen:  Abdomen is soft, non-tender, non-distended No hernias, masses, or organomegaly Normoactive bowel sounds.  Musculoskeletal:  No cyanosis, clubbing, or edema Skin:  No rashes, lesions, ulcers palpation of skin: no induration or nodules Neurologic:  CN 2-12 intact Sensation all 4 extremities intact Psychiatric:  Mental status Mood, affect appropriate Orientation to person, place, time  judgment and insight appear intact  Data Reviewed:  CBC CMP  Family Communication: Family at bedside  Disposition: Status is: Inpatient Remains inpatient appropriate because: Going to OR for reduction of incarcerated hernia later today.  Time spent: 34 minutes  Author: Katana Berthold, DO 11/27/2024 3:29 PM  For on call review [email or web address].

## 2024-11-27 NOTE — Plan of Care (Incomplete)
 Patient calm and cooperative A&O X4  Problem: Education: Goal: Knowledge of General Education information will improve Description: Including pain rating scale, medication(s)/side effects and non-pharmacologic comfort measures Outcome: Progressing   Problem: Health Behavior/Discharge Planning: Goal: Ability to manage health-related needs will improve Outcome: Progressing   Problem: Clinical Measurements: Goal: Ability to maintain clinical measurements within normal limits will improve Outcome: Progressing   Problem: Activity: Goal: Risk for activity intolerance will decrease Outcome: Progressing   Problem: Nutrition: Goal: Adequate nutrition will be maintained Outcome: Progressing   Problem: Elimination: Goal: Will not experience complications related to bowel motility Outcome: Progressing   Problem: Pain Managment: Goal: General experience of comfort will improve and/or be controlled Outcome: Progressing

## 2024-11-27 NOTE — Op Note (Signed)
 Preoperative diagnosis: Recurrent left femoral hernia  Postoperative diagnosis: Same  Procedure: Cooper ligament repair of left femoral hernia with ovitex 1S resorbable mesh  Surgeon: Debby Shipper, MD  Assistant: Dr. Marolyn Big, MD I was personally present during the key and critical portions of this procedure and immediately available throughout the entire procedure, as documented in my operative note.   EBL: Minimal  Anesthesia: General With 0.25% Marcaine and local with epinephrine  Specimen: None  Indications for procedure: The patient is a 63 year old female who was recently admitted for pneumonia and COPD exacerbation.  She is on chronic steroids and returned with a left femoral hernia.  It was initially incarcerated and reduced in the ED.  It then reincarcerated was reduced a second time while in the hospital.  We discussed options of management.  Given her immunocompromise state and need for long-term steroids, I explained that recurrence rates are high.  With her recent pneumonia I had concerns of seeding of the mesh and potential infection using a permanent mesh.  She has recurred and cannot go home unfortunately.  She will need to remain on steroids for an limited but extended period of time.  I discussed national tissue repair and the use of the resorbable mesh to help bolster this.  Explained there are high recurrence rates but given her current circumstances, I do not think she be discharged home without returning with recurrence and the steroids well to be weaned over the next month or so.  After explaining all of the options and limitations given her present circumstances, she agreed to proceed with tissue repair given the limitations and expectations described above.The risk of hernia repair include bleeding,  Infection,   Recurrence of the hernia,  Mesh use, chronic pain,  Organ injury,  Bowel injury,  Bladder injury,   nerve injury with numbness around the incision,   Death,  and worsening of preexisting  medical problems.  The alternatives to surgery have been discussed as well..  Long term expectations of both operative and non operative treatments have been discussed.   The patient agrees to proceed.    Description of procedure: The patient was met in the holding area questions were answered.  The left groin is marked as the correct site and all questions were answered.  She was taken back to the op room placed supine on the OR table.  After induction of general esthesia, the abdomen and groin regions were prepped and draped in a sterile fashion and timeout performed.  Proper patient, site of procedure were verified.  Local anesthetic was injected in the left inguinal crease.  Dissection was carried down until the aponeurosis of the external bleak identified.  A small incision was made with a scalpel and Metzenbaum scissors were used to open it through the external ring.  The ilioinguinal nerve and round ligament were identified.  I divided the round ligament at the internal ring with a 2-0 Vicryl tie.  I then open the floor the anal canal which was extremely thin.  I into the preperitoneal space.  Identified the femoral hernia defect.  The sac was reduced in its entirety back into the preperitoneal space.  A curvilinear incision was made to the conjoined tendon.  This mobilized the fascia of the internal bleak.  This was then pulled down and found to reach to the Cooper's ligament to help narrow the femoral canal without occluding the femoral vein or artery.  The overtax 1S was oval mesh was used and  this was placed in the peripheral space to help buttress the repair.  This was held in place with a single suture of #1 Novafil.  The fascial edge was then brought down and sutured to Cooper's ligament.  This help narrow the femoral canal.  The femoral vessels were not compromised or pinched by this.  Examination from below the inguinal ligament felt that the femoral canal was  closed.  I repaired the floor of the inguinal canal with interrupted #1 suture of Novafil.  These were interrupted.  The ilioinguinal nerve was preserved.  The aponeurosis of the external bleak was closed with interrupted 2-0 Vicryl.  3-0 Vicryl was used to approximate Scarpa's fascia and 4-0 Monocryl was used to close the skin in a subcuticular fashion.  Dermabond was applied.  All counts were found to be correct.  The patient was then awoke extubated taken recovery in satisfactory condition.  All counts were found to be correct.

## 2024-11-27 NOTE — Interval H&P Note (Signed)
 History and Physical Interval Note:  11/27/2024 2:36 PM  Candace Middleton  has presented today for surgery, with the diagnosis of FEMORAL HERNIA.  The various methods of treatment have been discussed with the patient and family. After consideration of risks, benefits and other options for treatment, the patient has consented to  Procedure(s) with comments: REPAIR, HERNIA, INGUINAL, ADULT (N/A) - OPEN FEMORAL HERNIA REPAIR as a surgical intervention.  The patient's history has been reviewed, patient examined, no change in status, stable for surgery.  I have reviewed the patient's chart and labs.  Questions were answered to the patient's satisfaction.   Case discussed with the patient.  Given her immunosuppression and history of recent pneumonia, ideally repair being deferred laparoscopic is the best option.  Since she cannot stay reduced, I have discussed with her native tissue repair with the use of a biologic.  I do have concerns about mesh infection therefore I do not think mesh is appropriate in the circumstance of natural tissue repair better.  I reviewed the procedure as well as potential slight increased risk of recurrence in this circumstance but given her steroid use and recent pneumonia, I think this would be her best option for now.  She understands the rationale for the above and agrees to proceed.The risk of hernia repair include bleeding,  Infection,   Recurrence of the hernia,  Mesh use, chronic pain,  Organ injury,  Bowel injury,  Bladder injury,   nerve injury with numbness around the incision,  Death,  and worsening of preexisting  medical problems.  The alternatives to surgery have been discussed as well..  Long term expectations of both operative and non operative treatments have been discussed.   The patient agrees to proceed.   Raeghan Demeter A Ramaya Guile

## 2024-11-27 NOTE — Anesthesia Procedure Notes (Signed)
 Procedure Name: Intubation Date/Time: 11/27/2024 3:39 PM  Performed by: Elby Raelene SAUNDERS, CRNAPre-anesthesia Checklist: Patient identified, Emergency Drugs available, Suction available and Patient being monitored Patient Re-evaluated:Patient Re-evaluated prior to induction Oxygen Delivery Method: Circle System Utilized Preoxygenation: Pre-oxygenation with 100% oxygen Induction Type: IV induction Ventilation: Mask ventilation without difficulty Laryngoscope Size: Miller and 2 Grade View: Grade III Tube type: Oral Tube size: 7.0 mm Number of attempts: 1 Airway Equipment and Method: Stylet and Bite block Placement Confirmation: ETT inserted through vocal cords under direct vision, positive ETCO2 and breath sounds checked- equal and bilateral Secured at: 21 cm Tube secured with: Tape Dental Injury: Teeth and Oropharynx as per pre-operative assessment  Difficulty Due To: Difficulty was unanticipated and Difficult Airway- due to anterior larynx

## 2024-11-27 NOTE — Transfer of Care (Signed)
 Immediate Anesthesia Transfer of Care Note  Patient: Candace Middleton  Procedure(s) Performed: REPAIR OF LEFT FEMORAL HERNIA USING OVITEX MESH (Left)  Patient Location: PACU  Anesthesia Type:General  Level of Consciousness: awake, alert , and oriented  Airway & Oxygen Therapy: Patient Spontanous Breathing and Patient connected to nasal cannula oxygen  Post-op Assessment: Report given to RN and Post -op Vital signs reviewed and stable  Post vital signs: Reviewed and stable  Last Vitals:  Vitals Value Taken Time  BP 130/77 11/27/24 17:03  Temp 36.8 C 11/27/24 17:03  Pulse 81 11/27/24 17:07  Resp 10 11/27/24 17:07  SpO2 92 % 11/27/24 17:07  Vitals shown include unfiled device data.  Last Pain:  Vitals:   11/27/24 1703  TempSrc:   PainSc: 0-No pain         Complications: No notable events documented.

## 2024-11-27 NOTE — Progress Notes (Signed)
 Dr. Vanderbilt notified of patient's lovenox  dose (30 mg) today at 0954. Per Dr. Vanderbilt, That is fine.

## 2024-11-27 NOTE — Plan of Care (Signed)

## 2024-11-27 NOTE — Discharge Instructions (Addendum)
 UMBILICAL OR INGUINAL HERNIA REPAIR: POST OP INSTRUCTIONS  Always review your discharge instruction sheet given to you by the facility where your surgery was performed. IF YOU HAVE DISABILITY OR FAMILY LEAVE FORMS, YOU MUST BRING THEM TO THE OFFICE FOR PROCESSING.   DO NOT GIVE THEM TO YOUR DOCTOR.  A  prescription for pain medication may be given to you upon discharge.  Take your pain medication as prescribed, if needed.  If narcotic pain medicine is not needed, then you may take acetaminophen (Tylenol) or ibuprofen (Advil) as needed. Take your usually prescribed medications unless otherwise directed. If you need a refill on your pain medication, please contact your pharmacy.  They will contact our office to request authorization. Prescriptions will not be filled after 5 pm or on week-ends. You should follow a light diet the first 24 hours after arrival home, such as soup and crackers, etc.  Be sure to include lots of fluids daily.  Resume your normal diet the day after surgery. Most patients will experience some swelling and bruising around the umbilicus or in the groin and scrotum.  Ice packs and reclining will help.  Swelling and bruising can take several days to resolve.  It is common to experience some constipation if taking pain medication after surgery.  Increasing fluid intake and taking a stool softener (such as Colace) will usually help or prevent this problem from occurring.  A mild laxative (Milk of Magnesia or Miralax) should be taken according to package directions if there are no bowel movements after 48 hours. Unless discharge instructions indicate otherwise, you may remove your bandages 24-48 hours after surgery, and you may shower at that time.  You may have steri-strips (small skin tapes) in place directly over the incision.  These strips should be left on the skin for 7-10 days.  If your surgeon used skin glue on the incision, you may shower in 24 hours.  The glue will flake off  over the next 2-3 weeks.  Any sutures or staples will be removed at the office during your follow-up visit. ACTIVITIES:  You may resume regular (light) daily activities beginning the next day--such as daily self-care, walking, climbing stairs--gradually increasing activities as tolerated.  You may have sexual intercourse when it is comfortable.  Refrain from any heavy lifting or straining until approved by your doctor. You may drive when you are no longer taking prescription pain medication, you can comfortably wear a seatbelt, and you can safely maneuver your car and apply brakes. You should see your doctor in the office for a follow-up appointment approximately 2-3 weeks after your surgery.  Make sure that you call for this appointment within a day or two after you arrive home to insure a convenient appointment time.   WHEN TO CALL YOUR DOCTOR: Fever over 101.0 Inability to urinate Nausea and/or vomiting Extreme swelling or bruising Continued bleeding from incision. Increased pain, redness, or drainage from the incision  The clinic staff is available to answer your questions during regular business hours.  Please don't hesitate to call and ask to speak to one of the nurses for clinical concerns.  If you have a medical emergency, go to the nearest emergency room or call 911.  A surgeon from [facility] is always on call at the hospital   [address], [place], [place]  [address] ?  [address] [address], [place], [place]   [address] [phone] ? [phone] ? FAX [phone] Web site: [email or web address]

## 2024-11-27 NOTE — Anesthesia Preprocedure Evaluation (Signed)
 Anesthesia Evaluation  Patient identified by MRN, date of birth, ID band Patient awake    Reviewed: Allergy  & Precautions, H&P , NPO status , Patient's Chart, lab work & pertinent test results  Airway Mallampati: II  TM Distance: >3 FB Neck ROM: Full    Dental no notable dental hx. (+) Teeth Intact, Dental Advisory Given   Pulmonary pneumonia, COPD,  COPD inhaler, Current Smoker and Patient abstained from smoking.   Pulmonary exam normal breath sounds clear to auscultation       Cardiovascular negative cardio ROS  Rhythm:Regular Rate:Normal     Neuro/Psych   Anxiety Depression    negative neurological ROS     GI/Hepatic Neg liver ROS,GERD  Medicated,,  Endo/Other  negative endocrine ROS    Renal/GU negative Renal ROS  negative genitourinary   Musculoskeletal  (+) Arthritis , Osteoarthritis,    Abdominal   Peds  Hematology negative hematology ROS (+)   Anesthesia Other Findings   Reproductive/Obstetrics negative OB ROS                              Anesthesia Physical Anesthesia Plan  ASA: 2  Anesthesia Plan: General   Post-op Pain Management: Tylenol  PO (pre-op)*   Induction: Intravenous  PONV Risk Score and Plan: 3 and Ondansetron , Dexamethasone  and Midazolam   Airway Management Planned: Oral ETT  Additional Equipment:   Intra-op Plan:   Post-operative Plan: Extubation in OR  Informed Consent: I have reviewed the patients History and Physical, chart, labs and discussed the procedure including the risks, benefits and alternatives for the proposed anesthesia with the patient or authorized representative who has indicated his/her understanding and acceptance.     Dental advisory given  Plan Discussed with: CRNA  Anesthesia Plan Comments:          Anesthesia Quick Evaluation

## 2024-11-28 ENCOUNTER — Other Ambulatory Visit (HOSPITAL_COMMUNITY): Payer: Self-pay

## 2024-11-28 ENCOUNTER — Encounter (HOSPITAL_COMMUNITY): Payer: Self-pay | Admitting: Surgery

## 2024-11-28 LAB — CBC WITH DIFFERENTIAL/PLATELET
Abs Immature Granulocytes: 0.08 K/uL — ABNORMAL HIGH (ref 0.00–0.07)
Basophils Absolute: 0 K/uL (ref 0.0–0.1)
Basophils Relative: 0 %
Eosinophils Absolute: 0 K/uL (ref 0.0–0.5)
Eosinophils Relative: 0 %
HCT: 39.1 % (ref 36.0–46.0)
Hemoglobin: 13.7 g/dL (ref 12.0–15.0)
Immature Granulocytes: 1 %
Lymphocytes Relative: 12 %
Lymphs Abs: 2.1 K/uL (ref 0.7–4.0)
MCH: 32.5 pg (ref 26.0–34.0)
MCHC: 35 g/dL (ref 30.0–36.0)
MCV: 92.9 fL (ref 80.0–100.0)
Monocytes Absolute: 2.1 K/uL — ABNORMAL HIGH (ref 0.1–1.0)
Monocytes Relative: 13 %
Neutro Abs: 12.5 K/uL — ABNORMAL HIGH (ref 1.7–7.7)
Neutrophils Relative %: 74 %
Platelets: 337 K/uL (ref 150–400)
RBC: 4.21 MIL/uL (ref 3.87–5.11)
RDW: 12.7 % (ref 11.5–15.5)
WBC: 16.8 K/uL — ABNORMAL HIGH (ref 4.0–10.5)
nRBC: 0 % (ref 0.0–0.2)

## 2024-11-28 LAB — BASIC METABOLIC PANEL WITH GFR
Anion gap: 4 — ABNORMAL LOW (ref 5–15)
BUN: 13 mg/dL (ref 8–23)
CO2: 31 mmol/L (ref 22–32)
Calcium: 8.2 mg/dL — ABNORMAL LOW (ref 8.9–10.3)
Chloride: 100 mmol/L (ref 98–111)
Creatinine, Ser: 0.76 mg/dL (ref 0.44–1.00)
GFR, Estimated: >60 mL/min (ref >60)
Glucose, Bld: 217 mg/dL — ABNORMAL HIGH (ref 70–99)
Potassium: 3.6 mmol/L (ref 3.5–5.1)
Sodium: 135 mmol/L (ref 135–145)

## 2024-11-28 MED ORDER — BENZONATATE 100 MG PO CAPS
100.0000 mg | ORAL_CAPSULE | Freq: Three times a day (TID) | ORAL | 0 refills | Status: DC | PRN
  Filled 2024-11-28: qty 30, 10d supply, fill #0

## 2024-11-28 MED ORDER — OXYCODONE HCL 5 MG PO TABS
5.0000 mg | ORAL_TABLET | Freq: Four times a day (QID) | ORAL | 0 refills | Status: DC | PRN
  Filled 2024-11-28: qty 30, 7d supply, fill #0

## 2024-11-28 MED ORDER — IPRATROPIUM-ALBUTEROL 0.5-2.5 (3) MG/3ML IN SOLN: 3.0000 mL | RESPIRATORY_TRACT | Status: DC | PRN

## 2024-11-28 MED ORDER — METHOCARBAMOL 500 MG PO TABS
500.0000 mg | ORAL_TABLET | Freq: Three times a day (TID) | ORAL | 0 refills | Status: DC | PRN
  Filled 2024-11-28: qty 40, 14d supply, fill #0

## 2024-11-28 MED ORDER — ACETAMINOPHEN 500 MG PO TABS: 1000.0000 mg | ORAL_TABLET | Freq: Four times a day (QID) | ORAL | Status: AC | PRN

## 2024-11-28 NOTE — Progress Notes (Signed)
  Progress Note   Patient: Candace Middleton FMW:[id number] DOB: October 11, 1961 DOA: 11/25/2024     1 DOS: the patient was seen and examined on 11/28/2024   Brief hospital course: Patient is a 63 year old female with past medical history significant for COPD, tobacco use disorder, hyperlipidemia and hypertension.  Patient was recently admitted from 11/27-21/12/2023 for AECOPD and CAP (s/p IV CTX/azithromycin ).  Patient presented with groin pain 2/2 incarcerated femoral hernia s/p reduction x2.  Patient has undergone surgery.  Patient is medical cleared for discharge.  Patient will complete course of doxycycline .  Continue to wean down steroids.  Patient is known to the pulmonary team.  Patient will follow-up primary care provider and pulmonary team on discharge.  Assessment and Plan:   AECOPD - Complete course of doxycycline .   - Change IV Solu-Medrol  to prednisone  on discharge, and then wean down steroids.  -Triple inhaler therapy (LAMA/LABA/ICS) -Duonebs prn -Incentive spirometry and flutter valve prn -F/u with pulmonary team on discharge. -Patient refused nicotine  patches on discharge.   Cough      Subjective: No new complaints.  Physical Exam: Vitals:   11/27/24 2038 11/28/24 0422 11/28/24 0917 11/28/24 0954  BP: 123/61 120/65 114/72   Pulse: 70 65 72   Resp:   18   Temp: 98.3 F (36.8 C) 97.9 F (36.6 C) 98 F (36.7 C)   TempSrc: Oral Oral Oral   SpO2: 98% 96% 92% 92%  Weight:      Height:       Exam:  Constitutional:  The patient is awake, alert, and oriented x 3. No acute distress.  Patient is thin/cachectic Respiratory:  No increased work of breathing. No wheezes, rales, or rhonchi Diminished breath sounds throughout.  Cardiovascular:  S1-S2 Abdomen:  Abdomen is soft, non-tender, non-distended   Musculoskeletal:  No leg edema Neurologic:  Awake and alert. Data Reviewed:  Family Communication:   Disposition: Medically stable for discharge  Time  spent: 35 minutes  Author: Leatrice LILLETTE Chapel, MD 11/28/2024 11:41 AM  For on call review [email or web address].

## 2024-11-28 NOTE — TOC Transition Note (Signed)
 Transition of Care [facility]) - Discharge Note   Patient Details  Name: URA YINGLING MRN: [id number] Date of Birth: Dec 20, 1961  [facility] of [facility]) CM/SW Contact:  Roxie KANDICE Stain, RN Phone Number: 11/28/2024, 12:20 PM   Clinical Narrative:    Patient stable for discharge.  Patient had questions about POC, Jermaine with [employer] stated it is in progress and [employer] will reach out to deliver.  Follow up apt on AVS.    Final next level of care: Home/Self Care Barriers to Discharge: Barriers Resolved   Patient Goals and CMS Choice Patient states their goals for this hospitalization and ongoing recovery are:: return home          Discharge Placement               Home        Discharge Plan and Services Additional resources added to the After Visit Summary for                                       Social Drivers of Health (SDOH) Interventions SDOH Screenings   Food Insecurity: No Food Insecurity (11/26/2024)  Housing: Low Risk  (11/26/2024)  Transportation Needs: No Transportation Needs (11/26/2024)  Utilities: Not At Risk (11/26/2024)  Depression (PHQ2-9): Low Risk  (09/05/2019)  Social Connections: Unknown (11/26/2024)  Tobacco Use: High Risk (11/25/2024)     Readmission Risk Interventions    11/28/2024   12:20 PM  Readmission Risk Prevention Plan  Transportation Screening Complete  PCP or Specialist Appt within 5-7 Days Complete  Home Care Screening Complete  Medication Review (RN CM) Complete

## 2024-11-28 NOTE — Anesthesia Postprocedure Evaluation (Signed)
 Anesthesia Post Note  Patient: Candace Middleton  Procedure(s) Performed: REPAIR OF LEFT FEMORAL HERNIA USING OVITEX MESH (Left)     Patient location during evaluation: PACU Anesthesia Type: General Level of consciousness: awake and alert Pain management: pain level controlled Vital Signs Assessment: post-procedure vital signs reviewed and stable Respiratory status: spontaneous breathing, nonlabored ventilation, respiratory function stable and patient connected to nasal cannula oxygen Cardiovascular status: blood pressure returned to baseline and stable Postop Assessment: no apparent nausea or vomiting Anesthetic complications: no   No notable events documented.  Last Vitals:  Vitals:   11/27/24 2038 11/28/24 0422  BP: 123/61 120/65  Pulse: 70 65  Resp:    Temp: 36.8 C 36.6 C  SpO2: 98% 96%    Last Pain:  Vitals:   11/28/24 0634  TempSrc:   PainSc: 7                  Rome Ade

## 2024-11-28 NOTE — Plan of Care (Signed)
 Patient calm and cooperative A&O X4. Patient able to ambulate independently to bathroom. Pain managed well with PRN medications. Patient left with call bell in reach and bed in lowest position.  Problem: Education: Goal: Knowledge of General Education information will improve Description: Including pain rating scale, medication(s)/side effects and non-pharmacologic comfort measures Outcome: Progressing   Problem: Health Behavior/Discharge Planning: Goal: Ability to manage health-related needs will improve Outcome: Progressing   Problem: Clinical Measurements: Goal: Ability to maintain clinical measurements within normal limits will improve Outcome: Progressing   Problem: Activity: Goal: Risk for activity intolerance will decrease Outcome: Progressing   Problem: Nutrition: Goal: Adequate nutrition will be maintained Outcome: Progressing   Problem: Coping: Goal: Level of anxiety will decrease Outcome: Progressing   Problem: Pain Managment: Goal: General experience of comfort will improve and/or be controlled Outcome: Progressing

## 2024-11-28 NOTE — Discharge Summary (Signed)
 Patient ID: Candace Middleton [id number] [birth year] 63 y.o.  Admit date: 11/25/2024 Discharge date: 11/28/2024  Admitting Diagnosis: Femoral hernia PNA on steroids  Discharge Diagnosis Patient Active Problem List   Diagnosis Date Noted   Femoral hernia of left side 11/26/2024   Acute on chronic hypoxic respiratory failure (HCC) 11/20/2024   SOB (shortness of breath) 04/15/2024   Severe sepsis (HCC) 04/15/2024   Leukocytosis 04/15/2024   Acute hypoxic respiratory failure (HCC) 04/15/2024   CAP (community acquired pneumonia) 04/14/2024   Osteoporosis 09/05/2019   COPD with acute exacerbation (HCC) 02/05/2018   Depression 02/05/2018   Coronary artery vasospasm 09/22/2015   Precordial pain    Coronary artery disease 09/01/2015   HLD (hyperlipidemia) 09/01/2015   Tobacco abuse 09/01/2015   History of bladder cancer 04/26/2015   History of tobacco use 06/24/2013   RSD (reflex sympathetic dystrophy) 06/24/2013   Complex regional pain syndrome of left lower extremity 06/24/2013   Neuromuscular disorder [facility]) 2014    Consultants Internal medicine  Reason for Admission: 63F recently admitted for PNA presents with acute onset L groin pain ~1000 on 12/2 causing her to present to the ED. Associated n/v. Known groin bulge for the last 2-63m. H/o bladder cancer, AVR. Current smoker, in the process of quitting, last cigarette use ~1w ago.    Atorva, lisinopril, singulair , inhaler, nebulizers, prednisone  50mg , doxycycline    Procedures Dr. Vanderbilt, 11/27/24 Cooper ligament repair of left femoral hernia with ovitex 1S resorbable mesh   Hospital Course:  The patient was admitted for observation.  Unfortunately, her hernia recurred so the decision was made to proceed with the above procedure.  She tolerated this well.  On POD 1, her pain was well controlled, she was tolerating a diet, voiding, and mobilizing.  She was passing flatus.  She was stable for DC home.  The medical team  assisted us  with this patient to help manage her medical problems, specifically her PNA and steroids while she was here.  This was all stable.  Physical Exam: Gen: NAD Lungs: respiratory effort nonlabored, occasional cough Abd: soft, appropriately tender, incision in L groin c/d/I with dermabond present  Allergies as of 11/28/2024       Reactions   Gabapentin  Hives   Patient reports facial swelling, hives, facial involvement    Sulfa Antibiotics Anaphylaxis   Onset at age 16, almost died because I stopped breathing.        Medication List     STOP taking these medications    oxyCODONE -acetaminophen  5-325 MG tablet Commonly known as: PERCOCET/ROXICET       TAKE these medications    acetaminophen  500 MG tablet Commonly known as: TYLENOL  Take 2 tablets (1,000 mg total) by mouth every 6 (six) hours as needed.   albuterol  108 (90 Base) MCG/ACT inhaler Commonly known as: VENTOLIN  HFA Inhale 2 puffs into the lungs every 6 (six) hours as needed for wheezing or shortness of breath.   albuterol  (2.5 MG/3ML) 0.083% nebulizer solution Commonly known as: PROVENTIL  Take 3 mLs (2.5 mg total) by nebulization every 6 (six) hours as needed for wheezing or shortness of breath.   amLODipine  5 MG tablet Commonly known as: NORVASC  Take 5 mg by mouth daily.   amLODipine  2.5 MG tablet Commonly known as: NORVASC  Take 1 tablet (2.5 mg total) by mouth at bedtime.   aspirin  EC 81 MG tablet Take 1 tablet (81 mg total) by mouth daily.   atorvastatin  20 MG tablet Commonly known as: LIPITOR TAKE  1 TABLET BY MOUTH EVERY DAY What changed: when to take this   benzonatate  100 MG capsule Commonly known as: TESSALON  Take 1 capsule (100 mg total) by mouth 3 (three) times daily as needed for cough.   bictegravir-emtricitabine-tenofovir AF 50-200-25 MG Tabs Prepack Commonly known as: BIKTARVY  Take 1 tablet by mouth daily.   budesonide -glycopyrrolate -formoterol  160-9-4.8 MCG/ACT Aero  inhaler Commonly known as: BREZTRI  Inhale 2 puffs into the lungs 2 (two) times daily.   doxycycline  100 MG tablet Commonly known as: VIBRA -TABS Take 1 tablet (100 mg total) by mouth 2 (two) times daily.   methocarbamol  500 MG tablet Commonly known as: ROBAXIN  Take 1 tablet (500 mg total) by mouth every 8 (eight) hours as needed for muscle spasms.   montelukast  10 MG tablet Commonly known as: SINGULAIR  Take 10 mg by mouth daily.   Ohtuvayre  3 MG/2.5ML Susp Generic drug: Ensifentrine  Inhale 3 mg into the lungs in the morning and at bedtime.   oxyCODONE  5 MG immediate release tablet Commonly known as: Oxy IR/ROXICODONE  Take 1-2 tablets (5-10 mg total) by mouth every 6 (six) hours as needed (pain).   pantoprazole  40 MG tablet Commonly known as: PROTONIX  Take 1 tablet (40 mg total) by mouth daily at 12 noon.   predniSONE  5 MG tablet Commonly known as: DELTASONE  Take 10 tabs by mouth for 3 days then, 8 tabs by mouth for 3 days, 6 tabs by mouth for 3 days, 4 tabs by mouth for 3 days, 2 tabs by mouth for 3 days, 1 tab by mouth for 3 days, 1/2 tab by mouth for 3 days then STOP. Total 95 tablets.          Follow-up Information     Maczis, Puja Gosai, PA-C Follow up on 12/23/2024.   Specialty: General Surgery Why: 11:15am, Arrive 30 minutes prior to your appointment time, Please bring your insurance card and photo ID Contact information: [address] [place] [address] [facility] [place] [address] [phone]                 Signed: Burnard Banter, [facility] 11/28/2024, 9:32 AM Please see Amion for pager number during day hours 7:00am-4:30pm, 7-11:30am on Weekends

## 2024-12-05 ENCOUNTER — Ambulatory Visit: Admitting: Allergy

## 2024-12-08 NOTE — Progress Notes (Unsigned)
 @Patient  ID: Candace Middleton, female    DOB: January 17, 1961, 63 y.o.   MRN: [id number]  No chief complaint on file.   Referring provider: No ref. provider found  HPI:  63 year old female, current smoker. PMH significant for CAD, COPD, CAP, complex pain syndrome, osteoporosis, hx bladder cancer, tobacco use.   Previous [facility] pulmonary encounter: 07/03/2024 -   Chief Complaint  Patient presents with   Consult    PCP referred. Pt has COPD and was admitted to the hospital with pneumonia and severe sepsis. SOB that gets worse with exertion.  Pt states that at home her o2 gets down to the 80%s after exertion.    Candace Middleton 63 y.o. -admmitted  [phone] - 04/18/24 for CAP Pneumococall RLL. Seen by ID recalls getting sick around April 02, 2024 going to urgent care getting antibiotics and prednisone  but then getting worse and then having rib fracture on the left side and then getting hospitalized.  Since discharge she is cut down on smoking but still continues to smoke.  Since admission she has improved a lot she is doing her ADLs but still is experiencing desaturations when she exercises [she does not have home oxygen and she was not aware of this preadmission] she finds it difficult to vacuum.  Her physical conditioning is almost back to baseline.  But she notes that she has ways to go.  She also wants to quit smoking.  She is on Breztri  which is helping her.  She wants albuterol  refilled.  She wants to know how bad her COPD is and the implications of having COPD.  She is lost 40 pounds of weight in the last 1 year which she attributes to taking care of her sibling and parent  Based on symptoms of fatigue, shortness of breath and cough and exercise hypoxemia.  1. Chronic obstructive pulmonary disease, unspecified COPD type (HCC)  J44.9       2. DOE (dyspnea on exertion)  R06.09       3. Exercise hypoxemia  R09.02       4. Hospital discharge follow-up  Z09       5. History of  pneumococcal pneumonia  Z87.01       6. History of acute respiratory failure  Z87.09         Definitely respiratory/emphysema.  Severity needs to be staged but good news you do not desaturate with exertion     Plan  - check alpha 1 Anti Trypsin Phenotype blood work - do QUantiferon Gold TB blood test - do cbc with diff - if eosinophil high - can consider biolotic  - do  ECHO - do HRCT supine and prone in 2 months -do full PFT   -xx - continue Breztri   2 puff twice daily  - continue albuterol  neb as needed             - CMA to do refill    - do ONO on room air   - QUIT SMOKING             - see place to contac below     FOllowup 6-8 weeks with aPP but after completing all of above   09/08/2024 Discussed the use of AI scribe software for clinical note transcription with the patient, who gave verbal consent to proceed.  History of Present Illness Candace Middleton is a 63 year old female with COPD and emphysema who presents for a follow-up after initial consultation  and diagnostic testing. She was referred by Dr. Geronimo for follow-up after initial consultation and diagnostic testing.  She was hospitalized in April for community-acquired pneumonia. Prior to hospitalization, she visited urgent care and received antibiotics and prednisone , but her symptoms worsened, leading to a rib fracture from coughing. Since discharge, she has reduced smoking but continues to smoke and is working on quitting. She is currently using Breztri  inhaler, which she feels is helping.  She has a history of COPD and emphysema, with recent diagnostic tests including a CT scan showing emphysema. Her lung function test showed 43% predicted lung function with some reversibility after bronchodilator use.  She experiences fatigue, shortness of breath, cough, and exercise-induced hypoxemia. Her main respiratory symptoms include exertional shortness of breath, wheezing, chest tightness, and a morning cough  that is 'crappy but nothing, I can't ever get anything up.' She uses Breztri  inhaler twice daily and has albuterol  inhaler and nebulizer for severe episodes, which she tries to use sparingly.  Her echocardiogram in August showed normal ejection fraction and no evidence of heart failure, with some mild calcification of the aortic valve. She is on Lipitor for cholesterol management and takes it consistently.  She has a family history of lung cancer, as her husband died from it seven years ago. She has a cat but reports no significant allergy  symptoms. She has not had childhood asthma or exercise-induced asthma symptoms as a child. She has had two to three flare-ups per year requiring prednisone  but has not been hospitalized for these exacerbations since the initial pneumonia episode.   09/08/2024 FENO>> 8  This result suggests low (<25) Type 2 (T2) airway inflammation indicating a low likelihood of active T2-driven airway inflammation; reduced probability of response to inhaled corticosteroids.    Plan Chronic obstructive pulmonary disease and asthma with emphysema Poorly controlled; COPD with emphysema as indicated by lung function test showing 43% predicted lung function with reversibility after bronchodilator use. Experiences exertional dyspnea, wheezing, chest tightness, and a morning cough. Breztri  inhaler has been somewhat effective. Has had 2-3 exacerbations per year requiring prednisone , and was hospitalized once this year. CT scan showed emphysema but no pulmonary fibrosis or suspicious lung nodules. Echocardiogram was largely normal, with no evidence of pulmonary hypertension or significant heart failure. - Continue Breztri  inhaler two puffs twice daily - Use albuterol  every 4-6 hours inhaler as needed for breakthrough sob/wheezing - Starting patient on Ohtuvayre  nebulizer twice daily to decrease COPD flare ups  - Consider adding on biologic therapy  - Order respiratory allergy  panel - Order  FENO test to assess airway inflammation - Administer pneumonia vaccine (Prevnar 20) today - Administer flu vaccine today - Discuss with Dr. Geronimo regarding starting Ohtuvayre  versus Nucala - Informed about the benefits of early detection through annual low-dose CT scans for lung cancer screening, given her smoking history and the absence of concerning nodules or masses.   Tobacco use disorder Continues to smoke but is working on quitting. Smoking cessation is crucial to prevent further lung damage and exacerbations of COPD and asthma. - Encourage smoking cessation - Refer to lung cancer screening    10/27/2024- Interim Discussed the use of AI scribe software for clinical note transcription with the patient, who gave verbal consent to proceed.  History of Present Illness Candace Middleton is a 63 year old female with severe COPD who presents for a follow-up visit.  She has a history of severe COPD with emphysema, experiencing recurrent flare-ups. Symptoms include exertional dyspnea, wheezing, chest  tightness, and a morning cough. She experiences about two to three exacerbations per year requiring prednisone , and was hospitalized once in the past year. She reports that her recent CT scan showed emphysema, and she was told there was no pulmonary fibrosis or suspicious lung nodules. She is currently using Breztri , which has been somewhat effective.  She has been approved for patient assistance for Ohtuvayre , a PD4 inhibitor, which she is expected to receive this week. Allergy  testing indicated a high IgE level of 663, although no specific allergens were identified. She experiences symptoms such as redness and swelling, particularly in the springtime, which started about four years ago. She is currently taking Singulair  (montelukast ) for allergies.  She reports recent chest congestion and difficulty expectorating mucus. No runny nose or postnasal drip. She is not currently on prednisone  but  notes that it usually helps with her symptoms. Her oxygen level was low this morning which improved to 90% RA but she does not normally use supplemental oxygen.   12/09/2024- interim hx  Discussed the use of AI scribe software for clinical note transcription with the patient, who gave verbal consent to proceed.  History of Present Illness  Hospital follow-up, admitted 11/27-12/1/25 for acute on chronic hypoxic respiratory failure. Re-admitted on 12/2-12/5/25 for SBO/femoral hernia left side.     Allergies[1]  Immunization History  Administered Date(s) Administered   INFLUENZA, HIGH DOSE SEASONAL PF 09/08/2024   Influenza,inj,Quad PF,6+ Mos 09/30/2015, 09/25/2018, 10/01/2019   Influenza-Unspecified 09/24/2017   PNEUMOCOCCAL CONJUGATE-20 09/08/2024   Pneumococcal Polysaccharide-23 10/01/2019   Tdap 10/30/2018    Past Medical History:  Diagnosis Date   Allergy     Anxiety    Arthritis    Cancer (HCC)    bladder ca   COPD (chronic obstructive pulmonary disease) (HCC)    Coronary artery disease    Depression    GERD (gastroesophageal reflux disease)    History of kidney stones    Hyperlipidemia    Neuromuscular disorder (HCC) 2014   RSD - nerve blocks in back for treatment    Osteoporosis 09/05/2019    Tobacco History: Tobacco Use History[2] Ready to quit: Not Answered Counseling given: Not Answered Tobacco comments: Pt smokes less than 0.5 ppd. AB, CMA 09-08-2024   Outpatient Medications Prior to Visit  Medication Sig Dispense Refill   acetaminophen  (TYLENOL ) 500 MG tablet Take 2 tablets (1,000 mg total) by mouth every 6 (six) hours as needed.     albuterol  (PROVENTIL ) (2.5 MG/3ML) 0.083% nebulizer solution Take 3 mLs (2.5 mg total) by nebulization every 6 (six) hours as needed for wheezing or shortness of breath. 75 mL 5   albuterol  (VENTOLIN  HFA) 108 (90 Base) MCG/ACT inhaler Inhale 2 puffs into the lungs every 6 (six) hours as needed for wheezing or shortness of  breath.     amLODipine  (NORVASC ) 2.5 MG tablet Take 1 tablet (2.5 mg total) by mouth at bedtime. 90 tablet 3   amLODipine  (NORVASC ) 5 MG tablet Take 5 mg by mouth daily.     aspirin  EC 81 MG tablet Take 1 tablet (81 mg total) by mouth daily. 30 tablet 3   atorvastatin  (LIPITOR) 20 MG tablet TAKE 1 TABLET BY MOUTH EVERY DAY (Patient taking differently: Take 20 mg by mouth at bedtime.) 90 tablet 3   benzonatate  (TESSALON ) 100 MG capsule Take 1 capsule (100 mg total) by mouth 3 (three) times daily as needed for cough. 30 capsule 0   bictegravir-emtricitabine-tenofovir AF (BIKTARVY ) 50-200-25 MG TABS Prepack Take 1 tablet  by mouth daily.     budeson-glycopyrrolate -formoterol  (BREZTRI ) 160-9-4.8 MCG/ACT AERO inhaler Inhale 2 puffs into the lungs 2 (two) times daily.     doxycycline  (VIBRA -TABS) 100 MG tablet Take 1 tablet (100 mg total) by mouth 2 (two) times daily. 10 tablet 0   Ensifentrine  (OHTUVAYRE ) 3 MG/2.5ML SUSP Inhale 3 mg into the lungs in the morning and at bedtime.     methocarbamol  (ROBAXIN ) 500 MG tablet Take 1 tablet (500 mg total) by mouth every 8 (eight) hours as needed for muscle spasms. 40 tablet 0   montelukast  (SINGULAIR ) 10 MG tablet Take 10 mg by mouth daily.     oxyCODONE  (OXY IR/ROXICODONE ) 5 MG immediate release tablet Take 1-2 tablets (5-10 mg total) by mouth every 6 (six) hours as needed (pain). 30 tablet 0   pantoprazole  (PROTONIX ) 40 MG tablet Take 1 tablet (40 mg total) by mouth daily at 12 noon. 30 tablet 0   predniSONE  (DELTASONE ) 5 MG tablet Take 10 tabs by mouth for 3 days then, 8 tabs by mouth for 3 days, 6 tabs by mouth for 3 days, 4 tabs by mouth for 3 days, 2 tabs by mouth for 3 days, 1 tab by mouth for 3 days, 1/2 tab by mouth for 3 days then STOP. Total 95 tablets. 95 tablet 0   No facility-administered medications prior to visit.      Review of Systems  Review of Systems   Physical Exam  There were no vitals taken for this visit. Physical Exam   ***  Lab Results:  CBC    Component Value Date/Time   WBC 16.8 (H) 11/28/2024 0437   RBC 4.21 11/28/2024 0437   HGB 13.7 11/28/2024 0437   HGB 15.3 09/27/2018 1048   HCT 39.1 11/28/2024 0437   HCT 46.9 (H) 09/27/2018 1048   PLT 337 11/28/2024 0437   PLT 307 09/27/2018 1048   MCV 92.9 11/28/2024 0437   MCV 91 09/27/2018 1048   MCH 32.5 11/28/2024 0437   MCHC 35.0 11/28/2024 0437   RDW 12.7 11/28/2024 0437   RDW 13.0 09/27/2018 1048   LYMPHSABS 2.1 11/28/2024 0437   LYMPHSABS 3.1 02/04/2018 1429   MONOABS 2.1 (H) 11/28/2024 0437   EOSABS 0.0 11/28/2024 0437   EOSABS 0.1 02/04/2018 1429   BASOSABS 0.0 11/28/2024 0437   BASOSABS 0.0 02/04/2018 1429    BMET    Component Value Date/Time   NA 135 11/28/2024 0437   NA 140 09/27/2018 1048   K 3.6 11/28/2024 0437   CL 100 11/28/2024 0437   CO2 31 11/28/2024 0437   GLUCOSE 217 (H) 11/28/2024 0437   BUN 13 11/28/2024 0437   BUN 6 09/27/2018 1048   CREATININE 0.76 11/28/2024 0437   CREATININE 0.64 09/01/2015 1532   CALCIUM  8.2 (L) 11/28/2024 0437   GFRNONAA >60 11/28/2024 0437   GFRNONAA >89 09/01/2015 1532   GFRAA 108 09/27/2018 1048   GFRAA >89 09/01/2015 1532    BNP    Component Value Date/Time   BNP 41.0 11/26/2024 0257    ProBNP No results found for: PROBNP  Imaging: CT ABDOMEN PELVIS W CONTRAST Result Date: 11/25/2024 EXAM: CT ABDOMEN AND PELVIS WITH CONTRAST 11/25/2024 10:47:12 PM TECHNIQUE: CT of the abdomen and pelvis was performed with the administration of 50 mL of iohexol  (OMNIPAQUE ) 350 MG/ML injection. Multiplanar reformatted images are provided for review. Automated exposure control, iterative reconstruction, and/or weight-based adjustment of the mA/kV was utilized to reduce the radiation dose to as low  as reasonably achievable. COMPARISON: 09/17/2023 CLINICAL HISTORY: Abdominal pain, acute, nonlocalized. FINDINGS: LOWER CHEST: Emphysema in the lung bases. LIVER: The liver is unremarkable.  GALLBLADDER AND BILE DUCTS: Gallbladder is unremarkable. No biliary ductal dilatation. SPLEEN: No acute abnormality. PANCREAS: No acute abnormality. ADRENAL GLANDS: No acute abnormality. KIDNEYS, URETERS AND BLADDER: No stones in the kidneys or ureters. No hydronephrosis. No perinephric or periureteral stranding. Urinary bladder is unremarkable. GI AND BOWEL: Stomach demonstrates no acute abnormality. Small bowel loop in the low pelvis measuring up to 3.1 cm in diameter with abrupt tapering at the point of herniation into a left femoral hernia (series 6 image 48). The left femoral vein is compressed by the hernia. There is no bowel obstruction. PERITONEUM AND RETROPERITONEUM: No ascites. No free air. VASCULATURE: Aorta is normal in caliber. LYMPH NODES: No lymphadenopathy. REPRODUCTIVE ORGANS: No acute abnormality. BONES AND SOFT TISSUES: No acute osseous abnormality. No focal soft tissue abnormality. IMPRESSION: 1. Small bowel obstruction secondary to herniation into a left femoral hernia. Surgical consult recommended. 2. Incidental emphysema at the lung bases; for patients aged 65, consider evaluation for low-dose CT lung cancer screening program. Electronically signed by: Norman Gatlin MD 11/25/2024 10:59 PM EST RP Workstation: [id number]   DG Chest 2 View Result Date: 11/25/2024 EXAM: 2 VIEW(S) XRAY OF THE CHEST 11/25/2024 10:23:00 PM COMPARISON: Comparison with 11/20/2024. CLINICAL HISTORY: SOB. FINDINGS: LUNGS AND PLEURA: Hyperinflation and chronic bronchitic change. No pleural effusion. No pneumothorax. HEART AND MEDIASTINUM: No acute abnormality of the cardiac and mediastinal silhouettes. BONES AND SOFT TISSUES: No acute osseous abnormality. IMPRESSION: 1. No acute abnormality. Emphysema. Electronically signed by: Norman Gatlin MD 11/25/2024 10:28 PM EST RP Workstation: [id number]   DG Chest 2 View Result Date: 11/20/2024 CLINICAL DATA:  Shortness of breath EXAM: CHEST - 2 VIEW COMPARISON:  Chest  radiograph dated 10/27/2024 FINDINGS: Hyperinflated lungs. Minimal bibasilar patchy opacities. No pleural effusion or pneumothorax. The heart size and mediastinal contours are within normal limits. No acute osseous abnormality. IMPRESSION: 1. Minimal bibasilar patchy opacities, likely atelectasis. Aspiration or pneumonia can be considered in the appropriate clinical setting. 2. Hyperinflated lungs, which can be seen in the setting of COPD. Electronically Signed   By: Limin  Xu M.D.   On: 11/20/2024 17:28     Assessment & Plan:   No problem-specific Assessment & Plan notes found for this encounter.   There are no diagnoses linked to this encounter.  Assessment and Plan Assessment & Plan       I personally spent a total of *** minutes in the care of the patient today including {Time Based Coding:[id number]}.   Candace LELON Ferrari, NP 12/08/2024    [1]  Allergies Allergen Reactions   Gabapentin  Hives    Patient reports facial swelling, hives, facial involvement    Sulfa Antibiotics Anaphylaxis    Onset at age 59, almost died because I stopped breathing.  [2]  Social History Tobacco Use  Smoking Status Every Day   Current packs/day: 0.50   Average packs/day: 0.5 packs/day for 30.0 years (15.0 ttl pk-yrs)   Types: Cigarettes  Smokeless Tobacco Never  Tobacco Comments   Pt smokes less than 0.5 ppd. AB, CMA 09-08-2024

## 2024-12-09 ENCOUNTER — Ambulatory Visit

## 2024-12-09 ENCOUNTER — Ambulatory Visit: Admitting: Primary Care

## 2024-12-09 ENCOUNTER — Encounter: Payer: Self-pay | Admitting: Primary Care

## 2024-12-09 VITALS — BP 112/60 | HR 91 | Temp 97.7°F | Ht 59.0 in | Wt 81.8 lb

## 2024-12-09 DIAGNOSIS — F17211 Nicotine dependence, cigarettes, in remission: Secondary | ICD-10-CM | POA: Diagnosis not present

## 2024-12-09 DIAGNOSIS — J302 Other seasonal allergic rhinitis: Secondary | ICD-10-CM | POA: Diagnosis not present

## 2024-12-09 DIAGNOSIS — J9611 Chronic respiratory failure with hypoxia: Secondary | ICD-10-CM

## 2024-12-09 DIAGNOSIS — J449 Chronic obstructive pulmonary disease, unspecified: Secondary | ICD-10-CM

## 2024-12-09 LAB — CBC WITH DIFFERENTIAL/PLATELET
Basophils Absolute: 0 K/uL (ref 0.0–0.1)
Basophils Relative: 0.2 % (ref 0.0–3.0)
Eosinophils Absolute: 0 K/uL (ref 0.0–0.7)
Eosinophils Relative: 0 % (ref 0.0–5.0)
HCT: 41.5 % (ref 36.0–46.0)
Hemoglobin: 14.3 g/dL (ref 12.0–15.0)
Lymphocytes Relative: 9.3 % — ABNORMAL LOW (ref 12.0–46.0)
Lymphs Abs: 1 K/uL (ref 0.7–4.0)
MCHC: 34.3 g/dL (ref 30.0–36.0)
MCV: 96.5 fl (ref 78.0–100.0)
Monocytes Absolute: 0.6 K/uL (ref 0.1–1.0)
Monocytes Relative: 5.8 % (ref 3.0–12.0)
Neutro Abs: 9.4 K/uL — ABNORMAL HIGH (ref 1.4–7.7)
Neutrophils Relative %: 84.7 % — ABNORMAL HIGH (ref 43.0–77.0)
Platelets: 480 K/uL — ABNORMAL HIGH (ref 150.0–400.0)
RBC: 4.3 Mil/uL (ref 3.87–5.11)
RDW: 14.1 % (ref 11.5–15.5)
WBC: 11.2 K/uL — ABNORMAL HIGH (ref 4.0–10.5)

## 2024-12-09 LAB — MAGNESIUM: Magnesium: 2 mg/dL (ref 1.5–2.5)

## 2024-12-09 LAB — COMPREHENSIVE METABOLIC PANEL WITH GFR
ALT: 22 U/L (ref 3–35)
AST: 15 U/L (ref 5–37)
Albumin: 3.8 g/dL (ref 3.5–5.2)
Alkaline Phosphatase: 56 U/L (ref 39–117)
BUN: 19 mg/dL (ref 6–23)
CO2: 28 meq/L (ref 19–32)
Calcium: 8.7 mg/dL (ref 8.4–10.5)
Chloride: 103 meq/L (ref 96–112)
Creatinine, Ser: 0.63 mg/dL (ref 0.40–1.20)
GFR: 92.82 mL/min (ref >60.00)
Glucose, Bld: 165 mg/dL — ABNORMAL HIGH (ref 70–99)
Potassium: 4 meq/L (ref 3.5–5.1)
Sodium: 140 meq/L (ref 135–145)
Total Bilirubin: 0.4 mg/dL (ref 0.2–1.2)
Total Protein: 5.8 g/dL — ABNORMAL LOW (ref 6.0–8.3)

## 2024-12-09 NOTE — Patient Instructions (Signed)
°  VISIT SUMMARY: You had a follow-up appointment today to review your recent hospitalizations for COPD exacerbation and hernia repair. We discussed your current medications, symptoms, and future treatment plans.  YOUR PLAN: -CHRONIC OBSTRUCTIVE PULMONARY DISEASE (COPD): COPD is a chronic lung disease that makes it hard to breathe. You were recently hospitalized for a COPD exacerbation and are currently on home oxygen therapy. Continue using Breztri  for maintenance and albuterol  nebulizer as needed. Complete your prednisone  taper as prescribed. We have ordered blood tests and a chest x-ray, and we will recheck your lung function in 3 months. If your exacerbations persist, we may consider biologic treatments like Nucala or Dupixent.  -SEASONAL ALLERGIC RHINITIS: Seasonal allergic rhinitis is an allergic reaction that causes symptoms like sinus infections, runny nose, and eye swelling. Your allergy  tests were negative, but your elevated IgE levels suggest an underlying allergy . We have referred you to an allergist for further evaluation and management, and we will discuss the potential use of biologics with the allergist.  INSTRUCTIONS: Please complete the blood tests (CBC and CMP) and chest x-ray as ordered. Follow up with the allergist as referred. We will recheck your lung function in 3 months.  Orders: CXR and labs today   Follow-up Keep apt with February with Dr. Geronimo

## 2024-12-14 ENCOUNTER — Ambulatory Visit: Payer: Self-pay | Admitting: Primary Care

## 2024-12-16 NOTE — Progress Notes (Signed)
 I called and spoke to pt. Pt informed of Beth's note and verbalized understanding. NFN

## 2024-12-23 ENCOUNTER — Ambulatory Visit (INDEPENDENT_AMBULATORY_CARE_PROVIDER_SITE_OTHER): Admitting: Family Medicine

## 2024-12-23 ENCOUNTER — Encounter: Payer: Self-pay | Admitting: Family Medicine

## 2024-12-23 VITALS — BP 144/82 | HR 75 | Ht 60.5 in | Wt 79.2 lb

## 2024-12-23 DIAGNOSIS — E782 Mixed hyperlipidemia: Secondary | ICD-10-CM | POA: Diagnosis not present

## 2024-12-23 DIAGNOSIS — I10 Essential (primary) hypertension: Secondary | ICD-10-CM

## 2024-12-23 DIAGNOSIS — K219 Gastro-esophageal reflux disease without esophagitis: Secondary | ICD-10-CM

## 2024-12-23 DIAGNOSIS — Z202 Contact with and (suspected) exposure to infections with a predominantly sexual mode of transmission: Secondary | ICD-10-CM | POA: Diagnosis not present

## 2024-12-23 MED ORDER — AMLODIPINE BESYLATE 5 MG PO TABS: 5.0000 mg | ORAL_TABLET | Freq: Every day | ORAL | 1 refills | Status: DC

## 2024-12-23 MED ORDER — PANTOPRAZOLE SODIUM 40 MG PO TBEC: 40.0000 mg | DELAYED_RELEASE_TABLET | Freq: Every day | ORAL | 2 refills | Status: AC

## 2024-12-23 MED ORDER — BIKTARVY 50-200-25 MG PO TABS: 1.0000 | ORAL_TABLET | Freq: Every day | ORAL | 1 refills | Status: AC

## 2024-12-23 MED ORDER — ATORVASTATIN CALCIUM 20 MG PO TABS: 20.0000 mg | ORAL_TABLET | Freq: Every day | ORAL | 3 refills | Status: AC

## 2024-12-23 NOTE — Progress Notes (Signed)
 "  Name: Candace Middleton   Date of Visit: 12/23/2024   Date of last visit with me: Visit date not found   CHIEF COMPLAINT:  Chief Complaint  Patient presents with   other    New pt. Est. Needs to schedule mammo and PAP, see's obgyn Dr. Scarlette, see's Dr. Rollin for colonoscopy,        HPI:  Discussed the use of AI scribe software for clinical note transcription with the patient, who gave verbal consent to proceed.  History of Present Illness   Candace Middleton is a 63 year old female who presents for follow-up after recent hospitalization and surgery.  She recently underwent a 'patch job' surgery for her hernia and is scheduled to see the surgeon this afternoon. She needs to be off prednisone  for two months before a permanent surgery can be performed. She completed her course of oral steroids on December 18, 2024, and feels much better since then.  She has a history of hypertension and is currently taking amlodipine  2.5 mg daily. Her amlodipine  dose was reduced from 5 mg to 2.5 mg after a recent cardiologist visit. She has recently quit smoking, which she did 'cold turkey', and acknowledges that this may have affected her blood pressure.  She is established with a pulmonologist and mentions a recent allergy  test that showed no allergies, but her IgE level was elevated at 663. She has been referred to an allergist for further evaluation.  She is currently taking Biktarvy  and requests a refill. She mentions that she is negative. She also requests refills for atorvastatin  and pantoprazole , noting that she has had difficulty keeping track of her medications due to a busy month.  She is the primary caregiver for her son, who is handicapped.         OBJECTIVE:       12/23/2024    8:45 AM  Depression screen PHQ 2/9  Decreased Interest 0  Down, Depressed, Hopeless 0  PHQ - 2 Score 0     BP Readings from Last 3 Encounters:  12/23/24 (!) 144/82  12/09/24 112/60  11/28/24  114/72    BP (!) 144/82   Pulse 75   Ht 5' 0.5 (1.537 m)   Wt 79 lb 3.2 oz (35.9 kg)   SpO2 97%   BMI 15.21 kg/m    Physical Exam          Physical Exam Constitutional:      Appearance: Normal appearance.  Neurological:     General: No focal deficit present.     Mental Status: She is alert and oriented to person, place, and time. Mental status is at baseline.     ASSESSMENT/PLAN:   Assessment & Plan Potential exposure to STD  Primary hypertension  Mixed hyperlipidemia  Gastroesophageal reflux disease without esophagitis    Assessment and Plan    Primary hypertension Blood pressure elevated. Reduced amlodipine  dose due to prior hypotension. Smoking cessation may increase blood pressure. Current dose may be insufficient. - Resent prescription for 5 mg amlodipine . - Instructed to take two 2.5 mg tablets until new prescription is filled. - Scheduled follow-up in 1-2 weeks to reassess blood pressure and adjust medication if necessary.  Mixed hyperlipidemia Managed with atorvastatin . - Refilled atorvastatin  prescription.  Gastroesophageal reflux disease Managed with pantoprazole . Plan to transition to famotidine to reduce risk of pneumonia and long-term stomach issues. - Refilled pantoprazole  prescription. - Plan to transition to famotidine in the future.  General health maintenance Discussed  smoking cessation impact on blood pressure. Established care with new primary care provider and pulmonologist. Referral to allergist for elevated IgE levels. - Continue follow-up with pulmonologist and allergist. - Plan for Medicare visit in two weeks.         Nairi Oswald A. Vita MD [facility] "

## 2024-12-28 ENCOUNTER — Other Ambulatory Visit: Payer: Self-pay | Admitting: Family Medicine

## 2024-12-28 DIAGNOSIS — I10 Essential (primary) hypertension: Secondary | ICD-10-CM

## 2025-01-06 ENCOUNTER — Ambulatory Visit: Admitting: Family Medicine

## 2025-01-06 ENCOUNTER — Encounter: Payer: Self-pay | Admitting: Family Medicine

## 2025-01-06 VITALS — BP 122/78 | HR 83 | Wt 78.4 lb

## 2025-01-06 DIAGNOSIS — I10 Essential (primary) hypertension: Secondary | ICD-10-CM

## 2025-01-06 DIAGNOSIS — R7989 Other specified abnormal findings of blood chemistry: Secondary | ICD-10-CM | POA: Diagnosis not present

## 2025-01-06 LAB — COMPREHENSIVE METABOLIC PANEL WITH GFR
ALT: 23 IU/L (ref 0–32)
AST: 29 IU/L (ref 0–40)
Albumin: 3.9 g/dL (ref 3.9–4.9)
Alkaline Phosphatase: 84 IU/L (ref 49–135)
BUN/Creatinine Ratio: 15 (ref 12–28)
BUN: 11 mg/dL (ref 8–27)
Bilirubin Total: 0.4 mg/dL (ref 0.0–1.2)
CO2: 22 mmol/L (ref 20–29)
Calcium: 9.3 mg/dL (ref 8.7–10.3)
Chloride: 101 mmol/L (ref 96–106)
Creatinine, Ser: 0.75 mg/dL (ref 0.57–1.00)
Globulin, Total: 1.8 g/dL (ref 1.5–4.5)
Glucose: 116 mg/dL — ABNORMAL HIGH (ref 70–99)
Potassium: 3.6 mmol/L (ref 3.5–5.2)
Sodium: 138 mmol/L (ref 134–144)
Total Protein: 5.7 g/dL — ABNORMAL LOW (ref 6.0–8.5)
eGFR: 88 mL/min/1.73

## 2025-01-06 LAB — CBC WITH DIFFERENTIAL/PLATELET
Basophils Absolute: 0 x10E3/uL (ref 0.0–0.2)
Basos: 0 %
EOS (ABSOLUTE): 0.1 x10E3/uL (ref 0.0–0.4)
Eos: 1 %
Hematocrit: 45.1 % (ref 34.0–46.6)
Hemoglobin: 15.2 g/dL (ref 11.1–15.9)
Immature Grans (Abs): 0 x10E3/uL (ref 0.0–0.1)
Immature Granulocytes: 0 %
Lymphocytes Absolute: 3 x10E3/uL (ref 0.7–3.1)
Lymphs: 43 %
MCH: 32.3 pg (ref 26.6–33.0)
MCHC: 33.7 g/dL (ref 31.5–35.7)
MCV: 96 fL (ref 79–97)
Monocytes Absolute: 0.7 x10E3/uL (ref 0.1–0.9)
Monocytes: 10 %
Neutrophils Absolute: 3.2 x10E3/uL (ref 1.4–7.0)
Neutrophils: 46 %
Platelets: 363 x10E3/uL (ref 150–450)
RBC: 4.71 x10E6/uL (ref 3.77–5.28)
RDW: 12.5 % (ref 11.7–15.4)
WBC: 7.1 x10E3/uL (ref 3.4–10.8)

## 2025-01-06 NOTE — Progress Notes (Signed)
" ° °  Name: Candace Middleton   Date of Visit: 01/06/2025   Date of last visit with me: 12/28/2024   CHIEF COMPLAINT:  Chief Complaint  Patient presents with   Follow-up    2 week follow up recheck blood work.        HPI:  Discussed the use of AI scribe software for clinical note transcription with the patient, who gave verbal consent to proceed.  History of Present Illness   Candace Middleton is a 64 year old female who presents for routine follow-up and blood work recheck.  She is here to recheck her blood work after previous results showed high platelet levels. She reports checking her blood pressure at home, and states that her readings have been good.  Her blood sugar levels have been slightly elevated, with an A1c of 6.1. She attributes this to occasional poor dietary habits and believes there may be a genetic component, as it is not related to her weight.  She inquires about receiving the shingles vaccine, expressing concern about potential side effects based on her sister's experience.  She is due for a colonoscopy and plans to schedule it with her gastroenterologist, Dr. Rollin.         OBJECTIVE:       12/23/2024    8:45 AM  Depression screen PHQ 2/9  Decreased Interest 0  Down, Depressed, Hopeless 0  PHQ - 2 Score 0     BP Readings from Last 3 Encounters:  01/06/25 122/78  12/23/24 (!) 144/82  12/09/24 112/60    BP 122/78   Pulse 83   Wt 78 lb 6.4 oz (35.6 kg)   SpO2 98%   BMI 15.06 kg/m    Physical Exam          Physical Exam Constitutional:      Appearance: Normal appearance.  Neurological:     General: No focal deficit present.     Mental Status: She is alert and oriented to person, place, and time. Mental status is at baseline.     ASSESSMENT/PLAN:   Assessment & Plan Primary hypertension  Elevated platelet count    Assessment and Plan    Primary hypertension Blood pressure well-controlled with current management. -  Continue current antihypertensive regimen.  Elevated platelet count Platelet count remains elevated. - Ordered CBC to monitor platelet count. - previous labs from 12/09/2024 reviewed, CBC and CMP  General health maintenance Discussed shingles vaccination side effects. She is ready to proceed. No referral needed for colonoscopy as she has an appointment with Dr. Rollin. - Schedule colonoscopy with Dr. Rollin.         Angline Schweigert A. Vita MD [facility] "

## 2025-01-07 ENCOUNTER — Ambulatory Visit: Admitting: Allergy

## 2025-01-07 ENCOUNTER — Ambulatory Visit: Payer: Self-pay | Admitting: Family Medicine

## 2025-01-08 ENCOUNTER — Telehealth: Payer: Self-pay | Admitting: Internal Medicine

## 2025-01-08 NOTE — Telephone Encounter (Signed)
 New message   Communication  Reason for CRM: patient is calling because she say she received a message from ashlyn . Tried calling cal but no answer please give patient a cal, back and I dont see any notes or anything  [phone]

## 2025-01-09 NOTE — Telephone Encounter (Signed)
"    12/25/24  2:39 PM Result Note CXR showed findings consistent with COPD. No acute process. Degenerative changes to thoracic spine  DG Chest   Spoke with pt and notified of results per Dr. Landry Pt verbalized understanding and denied any questions.  "

## 2025-01-26 NOTE — Telephone Encounter (Signed)
 Pt returned call and notified [facility]

## 2025-01-26 NOTE — Progress Notes (Signed)
 LMOVM for pt to return call

## 2025-01-27 ENCOUNTER — Ambulatory Visit: Admitting: Internal Medicine

## 2025-02-05 ENCOUNTER — Ambulatory Visit: Admitting: Internal Medicine

## 2025-07-27 ENCOUNTER — Ambulatory Visit (HOSPITAL_COMMUNITY)
